# Patient Record
Sex: Female | Born: 1935 | Race: Black or African American | Hispanic: No | Smoking: Former smoker
Health system: Southern US, Community
[De-identification: ages and names within clinical notes are randomized; demographics above are authoritative.]

## PROBLEM LIST (undated history)

## (undated) DIAGNOSIS — E538 Deficiency of other specified B group vitamins: Secondary | ICD-10-CM

## (undated) DIAGNOSIS — E119 Type 2 diabetes mellitus without complications: Secondary | ICD-10-CM

## (undated) DIAGNOSIS — B351 Tinea unguium: Secondary | ICD-10-CM

## (undated) DIAGNOSIS — M810 Age-related osteoporosis without current pathological fracture: Secondary | ICD-10-CM

## (undated) DIAGNOSIS — E039 Hypothyroidism, unspecified: Secondary | ICD-10-CM

## (undated) DIAGNOSIS — F0390 Unspecified dementia without behavioral disturbance: Secondary | ICD-10-CM

## (undated) DIAGNOSIS — K279 Peptic ulcer, site unspecified, unspecified as acute or chronic, without hemorrhage or perforation: Secondary | ICD-10-CM

## (undated) DIAGNOSIS — K259 Gastric ulcer, unspecified as acute or chronic, without hemorrhage or perforation: Secondary | ICD-10-CM

## (undated) DIAGNOSIS — D649 Anemia, unspecified: Secondary | ICD-10-CM

## (undated) HISTORY — DX: Tinea unguium: B35.1

## (undated) HISTORY — PX: EXCISION VAGINAL CYST: SHX5825

## (undated) HISTORY — PX: COLONOSCOPY: SHX174

## (undated) HISTORY — DX: Peptic ulcer, site unspecified, unspecified as acute or chronic, without hemorrhage or perforation: K27.9

## (undated) HISTORY — DX: Deficiency of other specified B group vitamins: E53.8

## (undated) HISTORY — PX: ESOPHAGOGASTRODUODENOSCOPY: SHX1529

## (undated) HISTORY — PX: OTHER SURGICAL HISTORY: SHX169

## (undated) HISTORY — DX: Type 2 diabetes mellitus without complications: E11.9

---

## 1998-06-27 ENCOUNTER — Other Ambulatory Visit: Admission: RE | Admit: 1998-06-27 | Discharge: 1998-06-27 | Payer: Self-pay | Admitting: Obstetrics and Gynecology

## 1998-09-19 ENCOUNTER — Ambulatory Visit (HOSPITAL_COMMUNITY): Admission: RE | Admit: 1998-09-19 | Discharge: 1998-09-19 | Payer: Self-pay

## 1999-09-22 ENCOUNTER — Ambulatory Visit (HOSPITAL_COMMUNITY): Admission: RE | Admit: 1999-09-22 | Discharge: 1999-09-22 | Payer: Self-pay | Admitting: *Deleted

## 2000-10-09 ENCOUNTER — Encounter: Payer: Self-pay | Admitting: Internal Medicine

## 2000-10-09 ENCOUNTER — Ambulatory Visit (HOSPITAL_COMMUNITY): Admission: RE | Admit: 2000-10-09 | Discharge: 2000-10-09 | Payer: Self-pay | Admitting: *Deleted

## 2001-10-10 ENCOUNTER — Ambulatory Visit (HOSPITAL_COMMUNITY): Admission: RE | Admit: 2001-10-10 | Discharge: 2001-10-10 | Payer: Self-pay | Admitting: Internal Medicine

## 2001-10-10 ENCOUNTER — Encounter: Payer: Self-pay | Admitting: Internal Medicine

## 2002-10-13 ENCOUNTER — Ambulatory Visit (HOSPITAL_COMMUNITY): Admission: RE | Admit: 2002-10-13 | Discharge: 2002-10-13 | Payer: Self-pay | Admitting: Internal Medicine

## 2002-10-13 ENCOUNTER — Encounter: Payer: Self-pay | Admitting: Internal Medicine

## 2003-10-19 ENCOUNTER — Ambulatory Visit (HOSPITAL_COMMUNITY): Admission: RE | Admit: 2003-10-19 | Discharge: 2003-10-19 | Payer: Self-pay | Admitting: Internal Medicine

## 2004-09-12 ENCOUNTER — Ambulatory Visit: Payer: Self-pay | Admitting: Gastroenterology

## 2004-10-20 ENCOUNTER — Ambulatory Visit (HOSPITAL_COMMUNITY): Admission: RE | Admit: 2004-10-20 | Discharge: 2004-10-20 | Payer: Self-pay | Admitting: Internal Medicine

## 2004-12-26 ENCOUNTER — Ambulatory Visit: Payer: Self-pay | Admitting: Gastroenterology

## 2005-10-23 ENCOUNTER — Ambulatory Visit (HOSPITAL_COMMUNITY): Admission: RE | Admit: 2005-10-23 | Discharge: 2005-10-23 | Payer: Self-pay | Admitting: Internal Medicine

## 2006-10-14 ENCOUNTER — Ambulatory Visit: Payer: Self-pay | Admitting: Internal Medicine

## 2006-10-29 ENCOUNTER — Ambulatory Visit (HOSPITAL_COMMUNITY): Admission: RE | Admit: 2006-10-29 | Discharge: 2006-10-29 | Payer: Self-pay | Admitting: Internal Medicine

## 2006-11-05 ENCOUNTER — Ambulatory Visit: Payer: Self-pay | Admitting: Internal Medicine

## 2006-11-07 ENCOUNTER — Encounter: Admission: RE | Admit: 2006-11-07 | Discharge: 2006-11-07 | Payer: Self-pay | Admitting: Internal Medicine

## 2006-12-05 ENCOUNTER — Ambulatory Visit: Payer: Self-pay | Admitting: Internal Medicine

## 2006-12-21 ENCOUNTER — Emergency Department: Payer: Self-pay | Admitting: Emergency Medicine

## 2007-11-18 ENCOUNTER — Ambulatory Visit (HOSPITAL_COMMUNITY): Admission: RE | Admit: 2007-11-18 | Discharge: 2007-11-18 | Payer: Self-pay | Admitting: Internal Medicine

## 2007-11-28 ENCOUNTER — Encounter: Admission: RE | Admit: 2007-11-28 | Discharge: 2007-11-28 | Payer: Self-pay | Admitting: Internal Medicine

## 2008-04-30 ENCOUNTER — Other Ambulatory Visit: Payer: Self-pay

## 2008-04-30 ENCOUNTER — Emergency Department: Payer: Self-pay | Admitting: Emergency Medicine

## 2008-10-27 ENCOUNTER — Ambulatory Visit: Payer: Self-pay

## 2008-11-03 ENCOUNTER — Ambulatory Visit: Payer: Self-pay | Admitting: Vascular Surgery

## 2008-11-24 ENCOUNTER — Ambulatory Visit: Payer: Self-pay | Admitting: Vascular Surgery

## 2008-11-30 ENCOUNTER — Ambulatory Visit (HOSPITAL_COMMUNITY): Admission: RE | Admit: 2008-11-30 | Discharge: 2008-11-30 | Payer: Self-pay | Admitting: Internal Medicine

## 2008-12-01 ENCOUNTER — Inpatient Hospital Stay: Payer: Self-pay | Admitting: Vascular Surgery

## 2010-01-25 ENCOUNTER — Ambulatory Visit (HOSPITAL_COMMUNITY): Admission: RE | Admit: 2010-01-25 | Discharge: 2010-01-25 | Payer: Self-pay | Admitting: Internal Medicine

## 2010-01-27 ENCOUNTER — Encounter: Admission: RE | Admit: 2010-01-27 | Discharge: 2010-01-27 | Payer: Self-pay | Admitting: Internal Medicine

## 2010-08-27 ENCOUNTER — Encounter: Payer: Self-pay | Admitting: Internal Medicine

## 2011-01-19 ENCOUNTER — Other Ambulatory Visit (HOSPITAL_COMMUNITY): Payer: Self-pay | Admitting: Internal Medicine

## 2011-01-19 DIAGNOSIS — Z1231 Encounter for screening mammogram for malignant neoplasm of breast: Secondary | ICD-10-CM

## 2011-01-30 ENCOUNTER — Ambulatory Visit (HOSPITAL_COMMUNITY)
Admission: RE | Admit: 2011-01-30 | Discharge: 2011-01-30 | Disposition: A | Discharge status: Home / Self Care | Payer: Medicare Other | Source: Ambulatory Visit | Attending: Internal Medicine | Admitting: Internal Medicine

## 2011-01-30 DIAGNOSIS — Z1231 Encounter for screening mammogram for malignant neoplasm of breast: Secondary | ICD-10-CM | POA: Insufficient documentation

## 2011-02-22 ENCOUNTER — Ambulatory Visit: Payer: Self-pay | Admitting: Ophthalmology

## 2011-03-06 ENCOUNTER — Ambulatory Visit: Payer: Self-pay | Admitting: Ophthalmology

## 2011-04-24 ENCOUNTER — Ambulatory Visit: Payer: Self-pay | Admitting: Ophthalmology

## 2011-05-01 ENCOUNTER — Ambulatory Visit: Payer: Self-pay | Admitting: Ophthalmology

## 2011-12-27 ENCOUNTER — Other Ambulatory Visit (HOSPITAL_COMMUNITY): Payer: Self-pay | Admitting: Internal Medicine

## 2011-12-27 DIAGNOSIS — Z1231 Encounter for screening mammogram for malignant neoplasm of breast: Secondary | ICD-10-CM

## 2012-02-01 ENCOUNTER — Ambulatory Visit (HOSPITAL_COMMUNITY)
Admission: RE | Admit: 2012-02-01 | Discharge: 2012-02-01 | Disposition: A | Discharge status: Home / Self Care | Payer: Medicare Other | Source: Ambulatory Visit | Attending: Internal Medicine | Admitting: Internal Medicine

## 2012-02-01 DIAGNOSIS — Z1231 Encounter for screening mammogram for malignant neoplasm of breast: Secondary | ICD-10-CM

## 2012-06-27 ENCOUNTER — Ambulatory Visit: Payer: Self-pay | Admitting: Gastroenterology

## 2012-06-30 LAB — PATHOLOGY REPORT

## 2013-01-14 ENCOUNTER — Other Ambulatory Visit (HOSPITAL_COMMUNITY): Payer: Self-pay | Admitting: Internal Medicine

## 2013-01-14 DIAGNOSIS — Z1231 Encounter for screening mammogram for malignant neoplasm of breast: Secondary | ICD-10-CM

## 2013-02-02 ENCOUNTER — Ambulatory Visit (HOSPITAL_COMMUNITY)
Admission: RE | Admit: 2013-02-02 | Discharge: 2013-02-02 | Disposition: A | Discharge status: Home / Self Care | Payer: Medicare Other | Source: Ambulatory Visit | Attending: Internal Medicine | Admitting: Internal Medicine

## 2013-02-02 DIAGNOSIS — Z1231 Encounter for screening mammogram for malignant neoplasm of breast: Secondary | ICD-10-CM | POA: Insufficient documentation

## 2013-02-04 DIAGNOSIS — B351 Tinea unguium: Secondary | ICD-10-CM

## 2013-02-04 HISTORY — DX: Tinea unguium: B35.1

## 2013-04-22 ENCOUNTER — Encounter: Payer: Self-pay | Admitting: *Deleted

## 2013-04-22 DIAGNOSIS — B351 Tinea unguium: Secondary | ICD-10-CM | POA: Insufficient documentation

## 2013-05-06 ENCOUNTER — Encounter: Payer: Self-pay | Admitting: Podiatry

## 2013-05-06 ENCOUNTER — Ambulatory Visit (INDEPENDENT_AMBULATORY_CARE_PROVIDER_SITE_OTHER): Payer: Medicare Other | Admitting: Podiatry

## 2013-05-06 VITALS — BP 181/79 | HR 57 | Temp 97.8°F | Ht 66.0 in | Wt 152.4 lb

## 2013-05-06 DIAGNOSIS — B351 Tinea unguium: Secondary | ICD-10-CM

## 2013-05-06 DIAGNOSIS — M79609 Pain in unspecified limb: Secondary | ICD-10-CM

## 2013-05-06 NOTE — Progress Notes (Signed)
Janet Gross presents with a cc of painful nails bilateral.  There is no change in her past medical history medications or allergies.  Vitals are stable and she is alert and oriented times 3. Pulses are palpable. Her nails are thick yellow dystrophic and clinically mycotic and they are painful on palpation bilaterally.  Neurovascular is unchanged. Pain in limb secondary to onychomycosis 1-5 bilateral.  Discussed the etiology, pathology and conservative versus surgical therapy with her.  At this point in time I debrided the nails in thickness and length as covered service to pain.  No iatrogenic lesions are noted.  She will f/u with [place] in three months.

## 2013-05-26 ENCOUNTER — Ambulatory Visit: Payer: Self-pay | Admitting: Gastroenterology

## 2013-08-10 ENCOUNTER — Ambulatory Visit (INDEPENDENT_AMBULATORY_CARE_PROVIDER_SITE_OTHER): Payer: Medicare Other | Admitting: Podiatry

## 2013-08-10 ENCOUNTER — Encounter: Payer: Self-pay | Admitting: Podiatry

## 2013-08-10 VITALS — BP 145/76 | HR 55 | Resp 16 | Ht 66.0 in | Wt 152.0 lb

## 2013-08-10 DIAGNOSIS — B351 Tinea unguium: Secondary | ICD-10-CM

## 2013-08-10 DIAGNOSIS — M79609 Pain in unspecified limb: Secondary | ICD-10-CM

## 2013-08-10 NOTE — Progress Notes (Signed)
She presents today with a chief complaint of a painful corn to the medial aspect of the fifth digit of the right foot. She's also complaining of painful elongated toenails. She denies fever chills nausea vomiting muscle aches or pains.  Objective: Pulses are strongly palpable bilateral. Reactive hyperkeratosis to the medial and lateral aspect of the PIPJ digit right foot. Mild hammertoe deformities are noted bilateral. Nails are thick yellow dystrophic clinically mycotic and painful palpation as well as debridement.  Assessment: Pain in limb secondary to onychomycosis 1 through 5 bilateral.  Plan: Debridement of nails and reactive hyperkeratosis bilateral foot followup with her in 3 months.

## 2013-08-19 ENCOUNTER — Emergency Department: Payer: Self-pay

## 2013-08-19 LAB — CBC
HCT: 42 % (ref 35.0–47.0)
HGB: 13.4 g/dL (ref 12.0–16.0)
MCH: 25.2 pg — AB (ref 26.0–34.0)
MCHC: 31.9 g/dL — AB (ref 32.0–36.0)
MCV: 79 fL — AB (ref 80–100)
Platelet: 241 10*3/uL (ref 150–440)
RBC: 5.33 10*6/uL — AB (ref 3.80–5.20)
RDW: 17 % — ABNORMAL HIGH (ref 11.5–14.5)
WBC: 5.3 10*3/uL (ref 3.6–11.0)

## 2013-08-19 LAB — URINALYSIS, COMPLETE
BILIRUBIN, UR: NEGATIVE
Blood: NEGATIVE
Glucose,UR: 150 mg/dL (ref 0–75)
KETONE: NEGATIVE
Nitrite: NEGATIVE
PH: 6 (ref 4.5–8.0)
PROTEIN: NEGATIVE
RBC,UR: 2 /HPF (ref 0–5)
SPECIFIC GRAVITY: 1.02 (ref 1.003–1.030)
WBC UR: 6 /HPF (ref 0–5)

## 2013-08-19 LAB — BASIC METABOLIC PANEL
Anion Gap: 4 — ABNORMAL LOW (ref 7–16)
BUN: 14 mg/dL (ref 7–18)
CALCIUM: 9.3 mg/dL (ref 8.5–10.1)
CHLORIDE: 106 mmol/L (ref 98–107)
CO2: 29 mmol/L (ref 21–32)
Creatinine: 0.81 mg/dL (ref 0.60–1.30)
EGFR (African American): >60
GLUCOSE: 52 mg/dL — AB (ref 65–99)
OSMOLALITY: 275 (ref 275–301)
POTASSIUM: 3.8 mmol/L (ref 3.5–5.1)
SODIUM: 139 mmol/L (ref 136–145)

## 2013-11-09 ENCOUNTER — Ambulatory Visit: Payer: Medicare Other | Admitting: Podiatry

## 2013-11-16 ENCOUNTER — Ambulatory Visit (INDEPENDENT_AMBULATORY_CARE_PROVIDER_SITE_OTHER): Payer: Medicare Other | Admitting: Podiatry

## 2013-11-16 VITALS — Resp 16 | Ht 66.0 in | Wt 145.0 lb

## 2013-11-16 DIAGNOSIS — B351 Tinea unguium: Secondary | ICD-10-CM

## 2013-11-16 DIAGNOSIS — M79609 Pain in unspecified limb: Secondary | ICD-10-CM

## 2013-11-16 NOTE — Progress Notes (Signed)
She presents today for routine nail debridement and callus debridement particularly painful corn between the fourth and fifth digits right foot which I have suggested surgery. She does have surgery performed so she will wear suitable padding between the toes.  Objective: Vital signs are stable she is alert and oriented x3. Nails are thick yellow dystrophic with mycotic and painful palpation. She also has a painful corn between fourth fifth digits of the right foot.  Assessment: Pain in limb secondary to nails 1 through 5 bilateral being mycotic.Marland Kitchen  Plan: Debridement of nails and corns and calluses bilateral.

## 2014-02-15 ENCOUNTER — Encounter: Payer: Self-pay | Admitting: Podiatry

## 2014-02-15 ENCOUNTER — Ambulatory Visit (INDEPENDENT_AMBULATORY_CARE_PROVIDER_SITE_OTHER): Payer: Medicare Other | Admitting: Podiatry

## 2014-02-15 DIAGNOSIS — Q828 Other specified congenital malformations of skin: Secondary | ICD-10-CM

## 2014-02-15 DIAGNOSIS — E119 Type 2 diabetes mellitus without complications: Secondary | ICD-10-CM

## 2014-02-15 DIAGNOSIS — M79676 Pain in unspecified toe(s): Secondary | ICD-10-CM

## 2014-02-15 DIAGNOSIS — M79609 Pain in unspecified limb: Secondary | ICD-10-CM

## 2014-02-15 DIAGNOSIS — B351 Tinea unguium: Secondary | ICD-10-CM

## 2014-02-15 NOTE — Progress Notes (Signed)
She presents today with a chief complaint of painful elongated toenails and a callus between her fourth and fifth digits of her right foot.  Objective: Vital signs are stable she is alert and oriented x3. Porokeratotic lesion, soft corn fourth interdigital space of the right foot. No sign of skin breakdown or infection. Nails are thick yellow dystrophic with mycotic and severely elongated.  Assessment: Pain in limb secondary to onychomycosis 1 through 5 bilateral. Non-insulin-dependent diabetes mellitus. Porokeratosis and fourth interdigital space right foot.  Plan: Debridement all reactive hyperkeratotic tissue debris all nails 1 through 5 bilateral covered service.

## 2014-02-22 DIAGNOSIS — E039 Hypothyroidism, unspecified: Secondary | ICD-10-CM | POA: Insufficient documentation

## 2014-02-22 DIAGNOSIS — E119 Type 2 diabetes mellitus without complications: Secondary | ICD-10-CM | POA: Insufficient documentation

## 2014-02-22 DIAGNOSIS — E669 Obesity, unspecified: Secondary | ICD-10-CM | POA: Insufficient documentation

## 2014-02-22 DIAGNOSIS — M810 Age-related osteoporosis without current pathological fracture: Secondary | ICD-10-CM | POA: Insufficient documentation

## 2014-02-22 DIAGNOSIS — K259 Gastric ulcer, unspecified as acute or chronic, without hemorrhage or perforation: Secondary | ICD-10-CM | POA: Insufficient documentation

## 2014-02-22 DIAGNOSIS — E785 Hyperlipidemia, unspecified: Secondary | ICD-10-CM | POA: Insufficient documentation

## 2014-05-24 ENCOUNTER — Ambulatory Visit (INDEPENDENT_AMBULATORY_CARE_PROVIDER_SITE_OTHER): Payer: Medicare Other | Admitting: Podiatry

## 2014-05-24 DIAGNOSIS — B351 Tinea unguium: Secondary | ICD-10-CM

## 2014-05-24 DIAGNOSIS — M79676 Pain in unspecified toe(s): Secondary | ICD-10-CM

## 2014-05-24 NOTE — Progress Notes (Signed)
Presents today chief complaint of painful elongated toenails.  Objective: Pulses are palpable bilateral nails are thick, yellow dystrophic onychomycosis and painful palpation.   Assessment: Onychomycosis with pain in limb.  Plan: Treatment of nails in thickness and length as covered service secondary to pain.  

## 2014-08-19 ENCOUNTER — Ambulatory Visit: Payer: Self-pay | Admitting: Internal Medicine

## 2014-08-23 ENCOUNTER — Ambulatory Visit: Payer: Medicare Other | Admitting: Podiatry

## 2014-08-23 ENCOUNTER — Ambulatory Visit: Payer: Medicare Other

## 2014-08-30 ENCOUNTER — Ambulatory Visit: Payer: Self-pay

## 2014-09-06 ENCOUNTER — Ambulatory Visit (INDEPENDENT_AMBULATORY_CARE_PROVIDER_SITE_OTHER): Payer: Medicare Other | Admitting: Podiatry

## 2014-09-06 DIAGNOSIS — B351 Tinea unguium: Secondary | ICD-10-CM

## 2014-09-06 DIAGNOSIS — M79676 Pain in unspecified toe(s): Secondary | ICD-10-CM

## 2014-09-06 NOTE — Progress Notes (Signed)
Presents today chief complaint of painful elongated toenails.  Objective: Pulses are palpable bilateral nails are thick, yellow dystrophic onychomycosis and painful palpation.   Assessment: Onychomycosis with pain in limb.  Plan: Treatment of nails in thickness and length as covered service secondary to pain.  

## 2014-12-06 ENCOUNTER — Ambulatory Visit (INDEPENDENT_AMBULATORY_CARE_PROVIDER_SITE_OTHER): Payer: Medicare Other | Admitting: Podiatry

## 2014-12-06 DIAGNOSIS — M79676 Pain in unspecified toe(s): Secondary | ICD-10-CM | POA: Diagnosis not present

## 2014-12-06 DIAGNOSIS — B351 Tinea unguium: Secondary | ICD-10-CM | POA: Diagnosis not present

## 2014-12-06 NOTE — Progress Notes (Signed)
Presents today chief complaint of painful elongated toenails.  Objective: Pulses are palpable bilateral nails are thick, yellow dystrophic onychomycosis and painful palpation.   Assessment: Onychomycosis 1-5 bilateral with pain in limb.  Plan: Treatment of nails 1-5 bilateral in thickness and length as covered service secondary to pain.

## 2015-03-08 ENCOUNTER — Ambulatory Visit: Payer: Medicare Other | Admitting: Podiatry

## 2015-03-16 ENCOUNTER — Ambulatory Visit: Payer: Medicare Other | Admitting: Podiatry

## 2015-03-17 ENCOUNTER — Ambulatory Visit: Payer: Medicare Other | Admitting: Podiatry

## 2015-03-23 ENCOUNTER — Ambulatory Visit (INDEPENDENT_AMBULATORY_CARE_PROVIDER_SITE_OTHER): Payer: Medicare Other | Admitting: Podiatry

## 2015-03-23 ENCOUNTER — Encounter: Payer: Self-pay | Admitting: Podiatry

## 2015-03-23 DIAGNOSIS — B351 Tinea unguium: Secondary | ICD-10-CM

## 2015-03-23 DIAGNOSIS — M79676 Pain in unspecified toe(s): Secondary | ICD-10-CM | POA: Diagnosis not present

## 2015-03-23 NOTE — Progress Notes (Signed)
Janet Gross presents today with a chief complaint of painful elongated toenails. She states that even bothering for quite some t time particularly with shoe gear.  Objective: Vital signs are stable she's alert and oriented 81 year old white female strong palpable pulses bilateral. Her toes are painful on palpation to the nails and the nail plates which are incurvated nail margins the yellow dystrophic onychomycotic nails which are painful on palpation as well as debridement.  Assessment: Pain in limb secondary to onychomycosis 1 through 5 bilateral.  Plan: Debridement of her nails 1 through 5 and 6 through 10 bilateral foot. Follow up with her in 3 months if needed.  Dr. Roselind Messier

## 2015-06-22 ENCOUNTER — Ambulatory Visit (INDEPENDENT_AMBULATORY_CARE_PROVIDER_SITE_OTHER): Payer: Medicare Other | Admitting: Podiatry

## 2015-06-22 ENCOUNTER — Encounter: Payer: Self-pay | Admitting: Podiatry

## 2015-06-22 DIAGNOSIS — B351 Tinea unguium: Secondary | ICD-10-CM

## 2015-06-22 DIAGNOSIS — M79676 Pain in unspecified toe(s): Secondary | ICD-10-CM | POA: Diagnosis not present

## 2015-06-22 NOTE — Progress Notes (Signed)
She presents today with a chief complaint of painful elongated toenails.  Objective: Vital signs are stable she is alert and oriented 3. Pulses are strongly palpable bilateral. Neurologic sensorium is intact. Toenails are thick yellow dystrophic onychomycotic and painful palpation.  Assessment: Pain in limb secondary to onychomycosis 1 through 5 bilateral feet.  Plan: Debridement of toenails 1 through 5 bilateral. Follow up with her in 3 months.

## 2015-07-13 ENCOUNTER — Encounter: Payer: Self-pay | Admitting: Urgent Care

## 2015-07-13 ENCOUNTER — Observation Stay
Admission: EM | Admit: 2015-07-13 | Discharge: 2015-07-14 | Disposition: A | Discharge status: Home / Self Care | Payer: Medicare Other | Attending: Internal Medicine | Admitting: Internal Medicine

## 2015-07-13 DIAGNOSIS — Z79899 Other long term (current) drug therapy: Secondary | ICD-10-CM | POA: Diagnosis not present

## 2015-07-13 DIAGNOSIS — Z87891 Personal history of nicotine dependence: Secondary | ICD-10-CM | POA: Insufficient documentation

## 2015-07-13 DIAGNOSIS — R4182 Altered mental status, unspecified: Secondary | ICD-10-CM | POA: Diagnosis not present

## 2015-07-13 DIAGNOSIS — E11649 Type 2 diabetes mellitus with hypoglycemia without coma: Principal | ICD-10-CM | POA: Insufficient documentation

## 2015-07-13 DIAGNOSIS — E785 Hyperlipidemia, unspecified: Secondary | ICD-10-CM | POA: Diagnosis not present

## 2015-07-13 DIAGNOSIS — Z7982 Long term (current) use of aspirin: Secondary | ICD-10-CM | POA: Diagnosis not present

## 2015-07-13 DIAGNOSIS — Z882 Allergy status to sulfonamides status: Secondary | ICD-10-CM | POA: Diagnosis not present

## 2015-07-13 DIAGNOSIS — E162 Hypoglycemia, unspecified: Secondary | ICD-10-CM | POA: Diagnosis present

## 2015-07-13 DIAGNOSIS — Z794 Long term (current) use of insulin: Secondary | ICD-10-CM | POA: Diagnosis not present

## 2015-07-13 DIAGNOSIS — B351 Tinea unguium: Secondary | ICD-10-CM | POA: Insufficient documentation

## 2015-07-13 DIAGNOSIS — E039 Hypothyroidism, unspecified: Secondary | ICD-10-CM | POA: Insufficient documentation

## 2015-07-13 DIAGNOSIS — T383X1A Poisoning by insulin and oral hypoglycemic [antidiabetic] drugs, accidental (unintentional), initial encounter: Secondary | ICD-10-CM | POA: Diagnosis present

## 2015-07-13 LAB — GLUCOSE, CAPILLARY
GLUCOSE-CAPILLARY: 64 mg/dL — AB (ref 65–99)
GLUCOSE-CAPILLARY: 72 mg/dL (ref 65–99)
Glucose-Capillary: 27 mg/dL — CL (ref 65–99)
Glucose-Capillary: 72 mg/dL (ref 65–99)

## 2015-07-13 LAB — BASIC METABOLIC PANEL
Anion gap: 6 (ref 5–15)
BUN: 11 mg/dL (ref 6–20)
CALCIUM: 9.2 mg/dL (ref 8.9–10.3)
CHLORIDE: 105 mmol/L (ref 101–111)
CO2: 28 mmol/L (ref 22–32)
CREATININE: 0.69 mg/dL (ref 0.44–1.00)
GFR calc non Af Amer: >60 mL/min (ref >60)
Glucose, Bld: 112 mg/dL — ABNORMAL HIGH (ref 65–99)
Potassium: 3.9 mmol/L (ref 3.5–5.1)
SODIUM: 139 mmol/L (ref 135–145)

## 2015-07-13 LAB — URINALYSIS COMPLETE WITH MICROSCOPIC (ARMC ONLY)
BACTERIA UA: NONE SEEN
BILIRUBIN URINE: NEGATIVE
Glucose, UA: 50 mg/dL — AB
Hgb urine dipstick: NEGATIVE
Ketones, ur: NEGATIVE mg/dL
Leukocytes, UA: NEGATIVE
NITRITE: NEGATIVE
PH: 7 (ref 5.0–8.0)
PROTEIN: NEGATIVE mg/dL
Specific Gravity, Urine: 1.013 (ref 1.005–1.030)
WBC UA: NONE SEEN WBC/hpf (ref 0–5)

## 2015-07-13 LAB — CBC
HCT: 40.4 % (ref 35.0–47.0)
HEMOGLOBIN: 12.7 g/dL (ref 12.0–16.0)
MCH: 24.9 pg — AB (ref 26.0–34.0)
MCHC: 31.4 g/dL — AB (ref 32.0–36.0)
MCV: 79.3 fL — ABNORMAL LOW (ref 80.0–100.0)
Platelets: 259 10*3/uL (ref 150–440)
RBC: 5.09 MIL/uL (ref 3.80–5.20)
RDW: 16.2 % — AB (ref 11.5–14.5)
WBC: 6 10*3/uL (ref 3.6–11.0)

## 2015-07-13 MED ORDER — SODIUM CHLORIDE 0.9 % IV BOLUS (SEPSIS)
500.0000 mL | Freq: Once | INTRAVENOUS | Status: AC
  Administered 2015-07-13: 500 mL via INTRAVENOUS

## 2015-07-13 MED ORDER — DEXTROSE 50 % IV SOLN
1.0000 | Freq: Once | INTRAVENOUS | Status: AC
  Administered 2015-07-13: 50 mL via INTRAVENOUS

## 2015-07-13 MED ORDER — DEXTROSE 50 % IV SOLN: 1.0000 | Freq: Once | INTRAVENOUS | Status: DC

## 2015-07-13 NOTE — ED Provider Notes (Signed)
[facility] Emergency Department Provider Note REMINDER - THIS NOTE IS NOT A FINAL MEDICAL RECORD UNTIL IT IS SIGNED. UNTIL THEN, THE CONTENT BELOW MAY REFLECT INFORMATION FROM A DOCUMENTATION TEMPLATE, NOT THE ACTUAL PATIENT VISIT. ____________________________________________  Time seen: Approximately 9:43 PM  I have reviewed the triage vital signs and the nursing notes.   HISTORY  Chief Complaint Hypoglycemia    HPI Janet Gross is a 81 y.o. female presents with family for confusion. Janet Gross reports that the patient reported that her vision seemed off earlier today, and this evening she began to behave erratically try to put socks on top of her head and "talking out of her head" for about the last hour and a half. She was very combative with them, they brought her to the ER for further evaluation.  Blood sugar at triage and read 24. After receiving an amp of D50, the patient is alert oriented able to give good history.   She reports that today she ate an omelette for breakfast, she took her normal medications including Glucotrol at approximately 9 AM. She then did not eat well, thinks that she may only been eating oranges throughout the day and denies taking extra medication. She then started to develop some feeling strange, slight blurring of her vision, and then doesn't really recall much of events from this afternoon. She denies any numbness, tingling, weakness, slurred speech, headache, chest pain or other concerns. No recent illness or fever.   Past Medical History  Diagnosis Date  . Diabetes mellitus without complication ([place])   . Onychomycosis 02/04/2013    Patient Active Problem List   Diagnosis Date Noted  . Hypoglycemia 07/13/2015  . Onychomycosis     Past Surgical History  Procedure Laterality Date  . Neck artery    . Colonoscopy      Current Outpatient Rx  Name  Route  Sig  Dispense  Refill  . aspirin EC 81 MG tablet   Oral  Take 81 mg by mouth daily.          . Fe Fum-FePoly-Vit C-Vit B3 (INTEGRA) 62.5-62.5-40-3 MG CAPS   Oral   Take 1 capsule by mouth daily.          Marland Kitchen gabapentin (NEURONTIN) 300 MG capsule   Oral   Take 300 mg by mouth at bedtime.          Marland Kitchen glipiZIDE (GLUCOTROL) 5 MG tablet   Oral   Take 5 mg by mouth 2 (two) times daily before a meal.          . levothyroxine (SYNTHROID, LEVOTHROID) 100 MCG tablet   Oral   Take 100 mcg by mouth daily before breakfast.          . magnesium oxide (MAG-OX) 400 MG tablet   Oral   Take 400 mg by mouth daily.          . metFORMIN (GLUCOPHAGE) 1000 MG tablet   Oral   Take 1,000 mg by mouth 2 (two) times daily with a meal.          . simvastatin (ZOCOR) 40 MG tablet   Oral   Take 40 mg by mouth at bedtime.            Allergies Sulfa antibiotics  No family history on file.  Social History Social History  Substance Use Topics  . Smoking status: Former Research scientist (life sciences)  . Smokeless tobacco: Never Used     Comment: quit over 40 years  ago  . Alcohol Use: No    Review of Systems Constitutional: No fever/chills Eyes: Visual changes all resolved now. Had blurring of the vision earlier. ENT: No sore throat. Cardiovascular: Denies chest pain. Respiratory: Denies shortness of breath. Gastrointestinal: No abdominal pain.  No nausea, no vomiting.  No diarrhea.  No constipation. Genitourinary: Negative for dysuria. Musculoskeletal: Negative for back pain. Skin: Negative for rash. Neurological: Negative for headaches, focal weakness or numbness.  10-point ROS otherwise negative.  ____________________________________________   PHYSICAL EXAM:  VITAL SIGNS: ED Triage Vitals  Enc Vitals Group     BP 07/13/15 2136 168/149 mmHg     Pulse Rate 07/13/15 2136 70     Resp 07/13/15 2136 10     Temp 07/13/15 2136 97.4 F (36.3 C)     Temp Source 07/13/15 2136 Oral     SpO2 07/13/15 2136 98 %     Weight 07/13/15 2136 150 lb (68.04 kg)      Height 07/13/15 2136 5\' 8"  (1.727 m)     Head Cir --      Peak Flow --      Pain Score 07/13/15 2137 0     Pain Loc --      Pain Edu? --      Excl. in [place]? --    Constitutional: Alert and oriented. Well appearing and in no acute distress. Eyes: Conjunctivae are normal. PERRL. EOMI. Head: Atraumatic. Nose: No congestion/rhinnorhea. Mouth/Throat: Mucous membranes are slightly dry.  Oropharynx non-erythematous. Neck: No stridor.   Cardiovascular: Normal rate, regular rhythm. Grossly normal heart sounds.  Good peripheral circulation. Respiratory: Normal respiratory effort.  No retractions. Lungs CTAB. Gastrointestinal: Soft and nontender. No distention. No abdominal bruits. No CVA tenderness. Musculoskeletal: No lower extremity tenderness nor edema.  No joint effusions. Neurologic:    NIH score equals 0, performed by me at bedside. The patient has no pronator drift. The patient has normal cranial nerve exam. Extraocular movements are normal. Visual fields are normal. Patient has 5 out of 5 strength in all extremities. There is no numbness or gross, acute sensory abnormality in the extremities bilaterally. No speech disturbance. No dysarthria. No aphasia. No ataxia. Normal finger nose finger bilat. Patient speaking in full and clear sentences.   Skin:  Skin is warm, dry and intact. No rash noted. Psychiatric: Mood and affect are normal. Speech and behavior are normal.  ____________________________________________   LABS (all labs ordered are listed, but only abnormal results are displayed)  Labs Reviewed  GLUCOSE, CAPILLARY - Abnormal; Notable for the following:    Glucose-Capillary 27 (*)    All other components within normal limits  CBC - Abnormal; Notable for the following:    MCV 79.3 (*)    MCH 24.9 (*)    MCHC 31.4 (*)    RDW 16.2 (*)    All other components within normal limits  BASIC METABOLIC PANEL - Abnormal; Notable for the following:    Glucose, Bld 112  (*)    All other components within normal limits  URINALYSIS COMPLETEWITH MICROSCOPIC ([facility] ONLY) - Abnormal; Notable for the following:    Color, Urine YELLOW (*)    APPearance CLOUDY (*)    Glucose, UA 50 (*)    Squamous Epithelial / LPF 0-5 (*)    All other components within normal limits  GLUCOSE, CAPILLARY - Abnormal; Notable for the following:    Glucose-Capillary 64 (*)    All other components within normal limits  GLUCOSE, CAPILLARY  GLUCOSE,  CAPILLARY  CBG MONITORING, ED  CBG MONITORING, ED  CBG MONITORING, ED  CBG MONITORING, ED  CBG MONITORING, ED  CBG MONITORING, ED  CBG MONITORING, ED  CBG MONITORING, ED  CBG MONITORING, ED  CBG MONITORING, ED  CBG MONITORING, ED  CBG MONITORING, ED  CBG MONITORING, ED   ____________________________________________  EKG   ____________________________________________  RADIOLOGY   ____________________________________________   PROCEDURES  Procedure(s) performed: None  Critical Care performed: Yes, see critical care note(s)  CRITICAL CARE Performed by: Delman Kitten   Total critical care time: 35 minutes  Critical care time was exclusive of separately billable procedures and treating other patients.  Critical care was necessary to treat or prevent imminent or life-threatening deterioration.  Critical care was time spent personally by me on the following activities: development of treatment plan with patient and/or surrogate as well as nursing, discussions with consultants, evaluation of patient's response to treatment, examination of patient, obtaining history from patient or surrogate, ordering and performing treatments and interventions, ordering and review of laboratory studies, ordering and review of radiographic studies, pulse oximetry and re-evaluation of patient's condition.  Patient presented with acute alteration in mental status, found to be severely hypoglycemic requiring IV treatment including dextrose  50% for life-threatening hypoglycemia. ____________________________________________   INITIAL IMPRESSION / ASSESSMENT AND PLAN / ED COURSE  Pertinent labs & imaging results that were available during my care of the patient were reviewed by me and considered in my medical decision making (see chart for details).  Patient presents with hypoglycemia. Seemingly no obvious recent infectious symptoms, or signs to suggest acute renal failure by clinical exam or history. No obvious insult, but the patient did present with altered mental status that completely resolved after receiving D50. She is noted be on a sulfonylurea as well as metformin. Given herself on area treatment with severe hypoglycemia, we'll admit the patient to hospital for further management and observation. We will continue to check blood glucose is regular in the ER. Her neuro exam is completely normal with an NIH score of 0. No evidence of acute neurologic abnormality or need for CT the head. No check basic labs to observe her closely.  D/W Margarita Grizzle at poison control. 10PM. Advises send labs, observe with Q 1 hour glucose,  ____________________________________________   FINAL CLINICAL IMPRESSION(S) / ED DIAGNOSES  Final diagnoses:  Sulfonylurea poisoning, accidental or unintentional, initial encounter  Hypoglycemia      Delman Kitten, MD 07/13/15 2356

## 2015-07-13 NOTE — ED Notes (Signed)
Patient was given 4 ounces of orange juice

## 2015-07-13 NOTE — ED Notes (Signed)
Pt was given 4 ounces apple juice, [place] sandwich, apple and 1 pack graham crackers

## 2015-07-13 NOTE — ED Notes (Signed)
Pt was asked if she had taken any insulin - pt denied taking insulin.  Pt was asked what she has eaten today.  Pt responded she has eaten a cheese omlet, and a few oranges.

## 2015-07-13 NOTE — ED Notes (Signed)
Patient was given 4 ounces of apple juice

## 2015-07-13 NOTE — ED Notes (Signed)
Patient presents via POV from home. Patient reported to have been altered and "talking out of her head" - CBG check at home was reported to be 20. Patient arrives verbal and combative stating, "You crazy. You all crazy." Skin is warm and dry.

## 2015-07-13 NOTE — H&P (Signed)
[facility] NAME: Roselynne Devoss    MR#:  [id number]  DATE OF BIRTH:  1930/08/23  DATE OF ADMISSION:  07/13/2015  PRIMARY CARE PHYSICIAN: Madelyn Brunner, MD   REQUESTING/REFERRING PHYSICIAN: Dr Jacqualine Code  CHIEF COMPLAINT:  Altered mental status  HISTORY OF PRESENT ILLNESS:  Tanairi Geno  is a 81 y.o. female with a known history of type 2 diabetes, hyperlipidemia comes to the emergency room accompanied by her nieces after she was found not answering the door to family members this evening. Patient's husband had to get into the house from the back window and found patient confused and talking out of her head. The check the sugar at home and it was 20. The provider the emergency room received 1 amp of D50. Her sugars came up to 67. Patient had a [place] sandwich and a couple cups of juices. Her sugars hanging around the 70s. And internal medicine was consulted for admission due to hypoglycemia Patient at present does not want to get admitted. She was convinced to get admitted by me and patient's nieces. She is unable to remember any of her meds clearly asked her how she takes it. Per family there is possibly patient may not be taking her meds appropriately. No signs of overdose.  PAST MEDICAL HISTORY:   Past Medical History  Diagnosis Date  . Diabetes mellitus without complication ([place])   . Onychomycosis 02/04/2013    PAST SURGICAL HISTOIRY:   Past Surgical History  Procedure Laterality Date  . Neck artery    . Colonoscopy      SOCIAL HISTORY:   Social History  Substance Use Topics  . Smoking status: Former Research scientist (life sciences)  . Smokeless tobacco: Never Used     Comment: quit over 40 years ago  . Alcohol Use: No    FAMILY HISTORY:  No family history on file.  DRUG ALLERGIES:   Allergies  Allergen Reactions  . Sulfa Antibiotics     INCREASES HER BLOOD SUGAR , PT STATED     REVIEW OF SYSTEMS:  Review of Systems   Constitutional: Negative for fever, chills and weight loss.  HENT: Negative for ear discharge, ear pain and nosebleeds.   Eyes: Negative for blurred vision, pain and discharge.  Respiratory: Negative for sputum production, shortness of breath, wheezing and stridor.   Cardiovascular: Negative for chest pain, palpitations, orthopnea and PND.  Gastrointestinal: Negative for nausea, vomiting, abdominal pain and diarrhea.  Genitourinary: Negative for urgency and frequency.  Musculoskeletal: Negative for back pain and joint pain.  Neurological: Negative for sensory change, speech change, focal weakness and weakness.  Psychiatric/Behavioral: Negative for depression and hallucinations. The patient is not nervous/anxious.   All other systems reviewed and are negative.    MEDICATIONS AT HOME:   Prior to Admission medications   Medication Sig Start Date End Date Taking? Authorizing Provider  aspirin EC 81 MG tablet Take 81 mg by mouth daily.    Yes Historical Provider, MD  Fe Fum-FePoly-Vit C-Vit B3 (INTEGRA) 62.5-62.5-40-3 MG CAPS Take 1 capsule by mouth daily.    Yes Historical Provider, MD  gabapentin (NEURONTIN) 300 MG capsule Take 300 mg by mouth at bedtime.    Yes Historical Provider, MD  glipiZIDE (GLUCOTROL) 5 MG tablet Take 5 mg by mouth 2 (two) times daily before a meal.    Yes Historical Provider, MD  levothyroxine (SYNTHROID, LEVOTHROID) 100 MCG tablet Take 100 mcg by mouth daily before breakfast.  Yes Historical Provider, MD  magnesium oxide (MAG-OX) 400 MG tablet Take 400 mg by mouth daily.    Yes Historical Provider, MD  metFORMIN (GLUCOPHAGE) 1000 MG tablet Take 1,000 mg by mouth 2 (two) times daily with a meal.  01/26/14  Yes Historical Provider, MD  simvastatin (ZOCOR) 40 MG tablet Take 40 mg by mouth at bedtime.    Yes Historical Provider, MD      VITAL SIGNS:  Blood pressure 150/76, pulse 57, temperature 97.4 F (36.3 C), temperature source Oral, resp. rate 16, height 5\' 8"   (1.727 m), weight 150 lb (68.04 kg), SpO2 99 %.  PHYSICAL EXAMINATION:  GENERAL:  81 y.o.-year-old patient lying in the bed with no acute distress.  EYES: Pupils equal, round, reactive to light and accommodation. No scleral icterus. Extraocular muscles intact.  HEENT: Head atraumatic, normocephalic. Oropharynx and nasopharynx clear.  NECK:  Supple, no jugular venous distention. No thyroid enlargement, no tenderness.  LUNGS: Normal breath sounds bilaterally, no wheezing, rales,rhonchi or crepitation. No use of accessory muscles of respiration.  CARDIOVASCULAR: S1, S2 normal. No murmurs, rubs, or gallops.  ABDOMEN: Soft, nontender, nondistended. Bowel sounds present. No organomegaly or mass.  EXTREMITIES: No pedal edema, cyanosis, or clubbing.  NEUROLOGIC: Cranial nerves II through XII are intact. Muscle strength 5/5 in all extremities. Sensation intact. Gait not checked.  PSYCHIATRIC: The patient is alert and oriented x 3.  SKIN: No obvious rash, lesion, or ulcer.   LABORATORY PANEL:   CBC  Recent Labs Lab 07/13/15 2225  WBC 6.0  HGB 12.7  HCT 40.4  PLT 259   ------------------------------------------------------------------------------------------------------------------  Chemistries   Recent Labs Lab 07/13/15 2225  NA 139  K 3.9  CL 105  CO2 28  GLUCOSE 112*  BUN 11  CREATININE 0.69  CALCIUM 9.2    IMPRESSION AND PLAN:   Tama Waltermire  is a 81 y.o. female with a known history of type 2 diabetes, hyperlipidemia comes to the emergency room accompanied by her nieces after she was found not answering the door to family members this evening. Patient's husband had to get into the house from the back window and found patient confused and talking out of her head. The check the sugar at home and it was 20.  1. Acute hypoglycemia. -Patient has known history of diabetes she takes glipizide and metformin. She is not on any home insulin. Currently she is not able to tell if she  took any extra doses of her diabetes meds. -She was found to have sugars in the 20s. Last sugar was 72. -She is being admitted for overnight observation -Sliding-scale insulin -Hold by mouth diabetes meds  2. Hyper lipidemia Continue statins  3. Hypothyroidism continue Synthroid  4. DVT prophylaxis subcutaneous Lovenox  5. Care management consult for discharge planning  Above was discussed with patient was reluctant to get admitted. Patient's nieces were in the ER. Admission plan discussed.   All the records are reviewed and case discussed with ED provider. Management plans discussed with the patient, family and they are in agreement.  CODE STATUS: Full  TOTAL TIME TAKING CARE OF THIS PATIENT: 45 minutes.    Ashtin Melichar M.D on 07/13/2015 at 11:54 PM  Between 7am to 6pm - Pager - [phone]  After 6pm go to [email or web address] - password [email or web address] [facility]  Office  [phone]  CC: Primary care physician; Madelyn Brunner, MD

## 2015-07-14 LAB — GLUCOSE, CAPILLARY
GLUCOSE-CAPILLARY: 117 mg/dL — AB (ref 65–99)
GLUCOSE-CAPILLARY: 60 mg/dL — AB (ref 65–99)
GLUCOSE-CAPILLARY: 95 mg/dL (ref 65–99)
Glucose-Capillary: 163 mg/dL — ABNORMAL HIGH (ref 65–99)
Glucose-Capillary: 141 mg/dL — ABNORMAL HIGH (ref 65–99)

## 2015-07-14 LAB — BASIC METABOLIC PANEL
ANION GAP: 4 — AB (ref 5–15)
BUN: 11 mg/dL (ref 6–20)
CALCIUM: 8.7 mg/dL — AB (ref 8.9–10.3)
CO2: 27 mmol/L (ref 22–32)
Chloride: 109 mmol/L (ref 101–111)
Creatinine, Ser: 0.6 mg/dL (ref 0.44–1.00)
Glucose, Bld: 84 mg/dL (ref 65–99)
Potassium: 3.8 mmol/L (ref 3.5–5.1)
Sodium: 140 mmol/L (ref 135–145)

## 2015-07-14 MED ORDER — MAGNESIUM OXIDE 400 (241.3 MG) MG PO TABS
400.0000 mg | ORAL_TABLET | Freq: Every day | ORAL | Status: DC
  Administered 2015-07-14: 400 mg via ORAL
  Filled 2015-07-14: qty 1

## 2015-07-14 MED ORDER — ENOXAPARIN SODIUM 40 MG/0.4ML ~~LOC~~ SOLN
40.0000 mg | SUBCUTANEOUS | Status: DC
  Administered 2015-07-14: 40 mg via SUBCUTANEOUS
  Filled 2015-07-14: qty 0.4

## 2015-07-14 MED ORDER — ONDANSETRON HCL 4 MG PO TABS: 4.0000 mg | ORAL_TABLET | Freq: Four times a day (QID) | ORAL | Status: DC | PRN

## 2015-07-14 MED ORDER — ACETAMINOPHEN 325 MG PO TABS: 650.0000 mg | ORAL_TABLET | Freq: Four times a day (QID) | ORAL | Status: DC | PRN

## 2015-07-14 MED ORDER — SIMVASTATIN 40 MG PO TABS
40.0000 mg | ORAL_TABLET | Freq: Every day | ORAL | Status: DC
  Administered 2015-07-14: 40 mg via ORAL
  Filled 2015-07-14: qty 1

## 2015-07-14 MED ORDER — INSULIN ASPART 100 UNIT/ML ~~LOC~~ SOLN: 0.0000 [IU] | Freq: Three times a day (TID) | SUBCUTANEOUS | Status: DC

## 2015-07-14 MED ORDER — GABAPENTIN 300 MG PO CAPS
300.0000 mg | ORAL_CAPSULE | Freq: Every day | ORAL | Status: DC
  Administered 2015-07-14: 300 mg via ORAL
  Filled 2015-07-14: qty 1

## 2015-07-14 MED ORDER — ONDANSETRON HCL 4 MG/2ML IJ SOLN: 4.0000 mg | Freq: Four times a day (QID) | INTRAMUSCULAR | Status: DC | PRN

## 2015-07-14 MED ORDER — ACETAMINOPHEN 650 MG RE SUPP: 650.0000 mg | Freq: Four times a day (QID) | RECTAL | Status: DC | PRN

## 2015-07-14 MED ORDER — DEXTROSE 50 % IV SOLN
1.0000 | Freq: Once | INTRAVENOUS | Status: AC
  Administered 2015-07-14: 50 mL via INTRAVENOUS
  Filled 2015-07-14: qty 50

## 2015-07-14 MED ORDER — ASPIRIN EC 81 MG PO TBEC
81.0000 mg | DELAYED_RELEASE_TABLET | Freq: Every day | ORAL | Status: DC
  Administered 2015-07-14: 81 mg via ORAL
  Filled 2015-07-14: qty 1

## 2015-07-14 MED ORDER — LEVOTHYROXINE SODIUM 50 MCG PO TABS
100.0000 ug | ORAL_TABLET | Freq: Every day | ORAL | Status: DC
  Administered 2015-07-14: 100 ug via ORAL
  Filled 2015-07-14: qty 2

## 2015-07-14 MED ORDER — FE FUMARATE-B12-VIT C-FA-IFC PO CAPS
1.0000 | ORAL_CAPSULE | Freq: Every day | ORAL | Status: DC
  Administered 2015-07-14: 1 via ORAL
  Filled 2015-07-14: qty 1

## 2015-07-14 NOTE — Discharge Planning (Signed)
Pt IV removed. DC papers given, explained and educated.  Discussed LOW BS s/sx and what to do.  Also discussed need to check BS at home and contact someone if feeling that way.  Sending home with script for glucometer and suggestions for FU appt. VSS and RN assessment revealed stability for DC to home. Pt will be walked to front and family assisting with transport home via car.

## 2015-07-14 NOTE — ED Notes (Signed)
Poison control returned call back, gave recent CBG's and vitals.

## 2015-07-14 NOTE — Progress Notes (Signed)
Spoke with MD Fritzi Mandes for blood sugar 55. Order for 1 amp d50 and recheck blood sugar, apple juice also provided to patient.  Patient is A+O with no signs of distress.

## 2015-07-14 NOTE — Discharge Summary (Signed)
[facility] NAME: Janet Gross    MR#:  [id number]  DATE OF BIRTH:  [birth year]  DATE OF ADMISSION:  07/13/2015 ADMITTING PHYSICIAN: Fritzi Mandes, MD  DATE OF DISCHARGE: 07/14/2015  PRIMARY CARE PHYSICIAN: Madelyn Brunner, MD    ADMISSION DIAGNOSIS:  Hypoglycemia [E16.2] Sulfonylurea poisoning, accidental or unintentional, initial encounter [id number]  DISCHARGE DIAGNOSIS:  Active Problems:   Hypoglycemia   SECONDARY DIAGNOSIS:   Past Medical History  Diagnosis Date  . Diabetes mellitus without complication ([place])   . Onychomycosis 02/04/2013    HOSPITAL COURSE:   Janet Gross is a 81 y.o. female with a known history of type 2 diabetes, hyperlipidemia admitted for hypoglycemia.  1. Acute hypoglycemia. Poor by mouth intake and also on glipizide and metformin. -Sugars improved. Not on any D5 drip. -Completely alert and oriented. Continue to hold glipizide and metformin at discharge. -Similar episode last year. Glipizide might not be a good medicine for her. Advised to see PCP next week and see if metformin can be restarted -Continue to monitor sugars this morning and ambulate patient. If stable, discharge today. -Check A1c  2. Hyper lipidemia Continue statins  3. Hypothyroidism continue Synthroid  Discharge today. Plan discussed with son at bedside. Patient is independent at baseline.  DISCHARGE CONDITIONS:   Stable  CONSULTS OBTAINED:   None  DRUG ALLERGIES:   Allergies  Allergen Reactions  . Sulfa Antibiotics     INCREASES HER BLOOD SUGAR , PT STATED     DISCHARGE MEDICATIONS:   Current Discharge Medication List    CONTINUE these medications which have NOT CHANGED   Details  aspirin EC 81 MG tablet Take 81 mg by mouth daily.     Fe Fum-FePoly-Vit C-Vit B3 (INTEGRA) 62.5-62.5-40-3 MG CAPS Take 1 capsule by mouth daily.     gabapentin (NEURONTIN) 300 MG capsule Take 300 mg by mouth  at bedtime.     levothyroxine (SYNTHROID, LEVOTHROID) 100 MCG tablet Take 100 mcg by mouth daily before breakfast.     magnesium oxide (MAG-OX) 400 MG tablet Take 400 mg by mouth daily.     simvastatin (ZOCOR) 40 MG tablet Take 40 mg by mouth at bedtime.       STOP taking these medications     glipiZIDE (GLUCOTROL) 5 MG tablet      metFORMIN (GLUCOPHAGE) 1000 MG tablet          DISCHARGE INSTRUCTIONS:   1. PCP f/u in 1 week   If you experience worsening of your admission symptoms, develop shortness of breath, life threatening emergency, suicidal or homicidal thoughts you must seek medical attention immediately by calling 911 or calling your MD immediately  if symptoms less severe.  You Must read complete instructions/literature along with all the possible adverse reactions/side effects for all the Medicines you take and that have been prescribed to you. Take any new Medicines after you have completely understood and accept all the possible adverse reactions/side effects.   Please note  You were cared for by a hospitalist during your hospital stay. If you have any questions about your discharge medications or the care you received while you were in the hospital after you are discharged, you can call the unit and asked to speak with the hospitalist on call if the hospitalist that took care of you is not available. Once you are discharged, your primary care physician will handle any further medical issues. Please note that NO REFILLS  for any discharge medications will be authorized once you are discharged, as it is imperative that you return to your primary care physician (or establish a relationship with a primary care physician if you do not have one) for your aftercare needs so that they can reassess your need for medications and monitor your lab values.    Today   CHIEF COMPLAINT:   Chief Complaint  Patient presents with  . Hypoglycemia    family brought in pt stating pts  blood sugar is 20 with AMS    VITAL SIGNS:  Blood pressure 150/64, pulse 69, temperature 97.7 F (36.5 C), temperature source Oral, resp. rate 17, height 5\' 8"  (1.727 m), weight 68.04 kg (150 lb), SpO2 99 %.  I/O:   Intake/Output Summary (Last 24 hours) at 07/14/15 0815 Last data filed at 07/14/15 0410  Gross per 24 hour  Intake    240 ml  Output      2 ml  Net    238 ml    PHYSICAL EXAMINATION:   Physical Exam  GENERAL:  81 y.o.-year-old patient lying in the bed with no acute distress.  EYES: Pupils equal, round, reactive to light and accommodation. No scleral icterus. Extraocular muscles intact.  HEENT: Head atraumatic, normocephalic. Oropharynx and nasopharynx clear.  NECK:  Supple, no jugular venous distention. No thyroid enlargement, no tenderness.  LUNGS: Normal breath sounds bilaterally, no wheezing, rales,rhonchi or crepitation. No use of accessory muscles of respiration.  CARDIOVASCULAR: S1, S2 normal. No rubs, or gallops. 3/6 systolic murmur present ABDOMEN: Soft, non-tender, non-distended. Bowel sounds present. No organomegaly or mass.  EXTREMITIES: No pedal edema, cyanosis, or clubbing.  NEUROLOGIC: Cranial nerves II through XII are intact. Muscle strength 5/5 in all extremities. Sensation intact. Gait not checked.  PSYCHIATRIC: The patient is alert and oriented x 3.  SKIN: No obvious rash, lesion, or ulcer.   DATA REVIEW:   CBC  Recent Labs Lab 07/13/15 2225  WBC 6.0  HGB 12.7  HCT 40.4  PLT 259    Chemistries   Recent Labs Lab 07/14/15 0535  NA 140  K 3.8  CL 109  CO2 27  GLUCOSE 84  BUN 11  CREATININE 0.60  CALCIUM 8.7*    Cardiac Enzymes No results for input(s): TROPONINI in the last 168 hours.  Microbiology Results  No results found for this or any previous visit.  RADIOLOGY:  No results found.  EKG:   Orders placed or performed during the hospital encounter of 07/13/15  . ED EKG  . ED EKG  . EKG 12-Lead  . EKG 12-Lead       Management plans discussed with the patient, family and they are in agreement.  CODE STATUS:     Code Status Orders        Start     Ordered   07/14/15 0054  Full code   Continuous     07/14/15 0053      TOTAL TIME TAKING CARE OF THIS PATIENT: 37 minutes.    Gladstone Lighter M.D on 07/14/2015 at 8:15 AM  Between 7am to 6pm - Pager - [phone]  After 6pm go to [email or web address] - password [email or web address]  Office  [phone]  CC: Primary care physician; Madelyn Brunner, MD

## 2015-07-19 LAB — GLUCOSE, CAPILLARY: GLUCOSE-CAPILLARY: 65 mg/dL (ref 65–99)

## 2015-08-30 ENCOUNTER — Ambulatory Visit (INDEPENDENT_AMBULATORY_CARE_PROVIDER_SITE_OTHER): Payer: Medicare Other | Admitting: Sports Medicine

## 2015-08-30 ENCOUNTER — Encounter: Payer: Self-pay | Admitting: Sports Medicine

## 2015-08-30 DIAGNOSIS — Q828 Other specified congenital malformations of skin: Secondary | ICD-10-CM | POA: Diagnosis not present

## 2015-08-30 DIAGNOSIS — M2040 Other hammer toe(s) (acquired), unspecified foot: Secondary | ICD-10-CM

## 2015-08-30 DIAGNOSIS — E119 Type 2 diabetes mellitus without complications: Secondary | ICD-10-CM

## 2015-08-30 DIAGNOSIS — M79676 Pain in unspecified toe(s): Secondary | ICD-10-CM

## 2015-08-30 NOTE — Progress Notes (Signed)
Patient ID: Janet Gross, female   DOB: 1931-05-10, 82 y.o.   MRN: [id number] Subjective: Janet Gross is a 82 y.o. female patient with history of type 2 diabetes who presents to office today complaining of painful callus in 4th webspace on right foot; unable to trim. States that she just had her nails done; was hospitalized for low blood sugar in 50's. Patient denies any new changes in medication or new problems. Patient denies any new cramping, numbness, burning or tingling in the legs.  Patient Active Problem List   Diagnosis Date Noted  . Hypoglycemia 07/13/2015  . Onychomycosis    Current Outpatient Prescriptions on File Prior to Visit  Medication Sig Dispense Refill  . aspirin EC 81 MG tablet Take 81 mg by mouth daily.     . Fe Fum-FePoly-Vit C-Vit B3 (INTEGRA) 62.5-62.5-40-3 MG CAPS Take 1 capsule by mouth daily.     Marland Kitchen gabapentin (NEURONTIN) 300 MG capsule Take 300 mg by mouth at bedtime.     Marland Kitchen levothyroxine (SYNTHROID, LEVOTHROID) 100 MCG tablet Take 100 mcg by mouth daily before breakfast.     . magnesium oxide (MAG-OX) 400 MG tablet Take 400 mg by mouth daily.     . simvastatin (ZOCOR) 40 MG tablet Take 40 mg by mouth at bedtime.      No current facility-administered medications on file prior to visit.   Allergies  Allergen Reactions  . Sulfa Antibiotics Other (See Comments)    INCREASES HER BLOOD SUGAR , PT STATED  INCREASES HER BLOOD SUGAR , PT STATED    Objective: General: Patient is awake, alert, and oriented x 3 and in no acute distress.  Integument: Skin is warm, dry and supple bilateral. Nails are short and thickened and  dystrophic with subungual debris, consistent with onychomycosis, 1-5 bilateral. No signs of infection. No open lesions. +interdigital callus with nucleated core at medial aspect of 5th toe at 4th interspace with no signs of infection. Remaining integument unremarkable.  Vasculature:  Dorsalis Pedis pulse 1/4 bilateral. Posterior Tibial pulse   1/4 bilateral.  Capillary fill time <4 sec 1-5 bilateral.No hair growth to the level of the digits. Temperature gradient within normal limits. No varicosities present bilateral. No edema present bilateral.   Neurology: The patient has intact sensation measured with a 5.07/10g Semmes Weinstein Monofilament at all pedal sites bilateral . Vibratory sensation diminished bilateral with tuning fork. No Babinski sign present bilateral.   Musculoskeletal: Asymptomatic mild hammertoe pedal deformities noted bilateral. Muscular strength 5/5 in all lower extremity muscular groups bilateral without pain or limitation on range of motion . No tenderness with calf compression bilateral.  Assessment and Plan: Problem List Items Addressed This Visit    None    Visit Diagnoses    Porokeratosis    -  Primary    Hammer toe, unspecified laterality        Pain of toe, unspecified laterality        Diabetes mellitus without complication ([place])        with episodes of Hypoglycemia      -Examined patient. -Discussed and educated patient on diabetic foot care, especially with  regards to the vascular, neurological and musculoskeletal systems.  -Stressed the importance of good glycemic control and the detriment of not  controlling glucose levels in relation to the foot. -Mechanically debrided keratosis at 4th interspace on right without incident and gave patient toe spacer to use; educated patient on likelihood of recurrence secondary to hammertoe deformity  -Advised  wider and good supportive shoes -Answered all patient questions -Patient to return  in 3 months for at risk foot care -Patient advised to call the office if any problems or questions arise in the meantime.  Landis Martins, DPM

## 2015-09-19 ENCOUNTER — Ambulatory Visit: Payer: Medicare Other | Admitting: Podiatry

## 2015-11-29 ENCOUNTER — Encounter: Payer: Self-pay | Admitting: Sports Medicine

## 2015-11-29 ENCOUNTER — Ambulatory Visit (INDEPENDENT_AMBULATORY_CARE_PROVIDER_SITE_OTHER): Payer: Medicare Other | Admitting: Sports Medicine

## 2015-11-29 DIAGNOSIS — Q828 Other specified congenital malformations of skin: Secondary | ICD-10-CM | POA: Diagnosis not present

## 2015-11-29 DIAGNOSIS — E119 Type 2 diabetes mellitus without complications: Secondary | ICD-10-CM

## 2015-11-29 DIAGNOSIS — M79676 Pain in unspecified toe(s): Secondary | ICD-10-CM

## 2015-11-29 DIAGNOSIS — M2040 Other hammer toe(s) (acquired), unspecified foot: Secondary | ICD-10-CM

## 2015-11-29 DIAGNOSIS — B351 Tinea unguium: Secondary | ICD-10-CM | POA: Diagnosis not present

## 2015-11-29 NOTE — Progress Notes (Signed)
Patient ID: Janet Gross, female   DOB: May 19, 1931, 82 y.o.   MRN: [id number]   Subjective: Janet Gross is a 82 y.o. female patient with history of type 2 diabetes who presents to office today complaining of painful nails and callus in 4th webspace on right foot; unable to trim. FBS not recorded. Patient denies any new changes in medication or new problems. Patient denies any new cramping, numbness, burning or tingling in the legs.  Patient Active Problem List   Diagnosis Date Noted  . Hypoglycemia 07/13/2015  . Controlled type 2 diabetes mellitus without complication ([place]) [id number]  . Gastric ulcer 02/22/2014  . HLD (hyperlipidemia) 02/22/2014  . Adult hypothyroidism 02/22/2014  . Adiposity 02/22/2014  . Osteoporosis, 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  . Onychomycosis    Current Outpatient Prescriptions on File Prior to Visit  Medication Sig Dispense Refill  . aspirin EC 81 MG tablet Take 81 mg by mouth daily.     . Fe Fum-FePoly-Vit C-Vit B3 (INTEGRA) 62.5-62.5-40-3 MG CAPS Take 1 capsule by mouth daily.     Marland Kitchen gabapentin (NEURONTIN) 300 MG capsule Take 300 mg by mouth at bedtime.     Marland Kitchen levothyroxine (SYNTHROID, LEVOTHROID) 100 MCG tablet Take 100 mcg by mouth daily before breakfast.     . magnesium oxide (MAG-OX) 400 MG tablet Take 400 mg by mouth daily.     . simvastatin (ZOCOR) 40 MG tablet Take 40 mg by mouth at bedtime.      No current facility-administered medications on file prior to visit.   Allergies  Allergen Reactions  . Sulfa Antibiotics Other (See Comments)    INCREASES HER BLOOD SUGAR , PT STATED  INCREASES HER BLOOD SUGAR , PT STATED    Objective: General: Patient is awake, alert, and oriented x 3 and in no acute distress.  Integument: Skin is warm, dry and supple bilateral. Nails are elongated and thickened and  dystrophic with subungual debris, consistent with onychomycosis, 1-5 bilateral. No signs of infection. No open lesions. +interdigital callus with  nucleated core at medial aspect of 5th toe at 4th interspace with no signs of infection. Remaining integument unremarkable.  Vasculature:  Dorsalis Pedis pulse 1/4 bilateral. Posterior Tibial pulse  1/4 bilateral.  Capillary fill time <4 sec 1-5 bilateral.No hair growth to the level of the digits. Temperature gradient within normal limits. No varicosities present bilateral. No edema present bilateral.   Neurology: The patient has intact sensation measured with a 5.07/10g Semmes Weinstein Monofilament at all pedal sites bilateral . Vibratory sensation slightly diminished bilateral with tuning fork. No Babinski sign present bilateral.   Musculoskeletal: Asymptomatic mild hammertoe pedal deformities noted bilateral. Muscular strength 5/5 in all lower extremity muscular groups bilateral without pain or limitation on range of motion . No tenderness with calf compression bilateral.  Assessment and Plan: Problem List Items Addressed This Visit    None    Visit Diagnoses    Porokeratosis    -  Primary    Dermatophytosis of nail        Pain of toe, unspecified laterality        Hammer toe, unspecified laterality        Diabetes mellitus without complication ([place])          -Examined patient. -Discussed and educated patient on diabetic foot care, especially with  regards to the vascular, neurological and musculoskeletal systems.  -Stressed the importance of good glycemic control and the detriment of not  controlling glucose levels in relation to the  foot. -Mechanically debrided nails x 10 and keratosis at 4th interspace on right without incident and gave patient toe spacer to use; educated patient on likelihood of recurrence secondary to hammertoe deformity  -Advised wider and good supportive shoes -Answered all patient questions -Patient to return  in 3 months for at risk foot care -Patient advised to call the office if any problems or questions arise in the meantime.  Landis Martins, DPM

## 2016-02-28 ENCOUNTER — Encounter: Payer: Self-pay | Admitting: Sports Medicine

## 2016-02-28 ENCOUNTER — Ambulatory Visit (INDEPENDENT_AMBULATORY_CARE_PROVIDER_SITE_OTHER): Payer: Medicare Other | Admitting: Sports Medicine

## 2016-02-28 DIAGNOSIS — Q828 Other specified congenital malformations of skin: Secondary | ICD-10-CM | POA: Diagnosis not present

## 2016-02-28 DIAGNOSIS — E119 Type 2 diabetes mellitus without complications: Secondary | ICD-10-CM

## 2016-02-28 DIAGNOSIS — B351 Tinea unguium: Secondary | ICD-10-CM | POA: Diagnosis not present

## 2016-02-28 DIAGNOSIS — M79676 Pain in unspecified toe(s): Secondary | ICD-10-CM

## 2016-02-28 DIAGNOSIS — M2040 Other hammer toe(s) (acquired), unspecified foot: Secondary | ICD-10-CM

## 2016-02-28 NOTE — Progress Notes (Signed)
Patient ID: Janet Gross, female   DOB: 1930-11-18, 82 y.o.   MRN: [id number]   Subjective: Janet Gross is a 82 y.o. female patient with history of type 2 diabetes who presents to office today complaining of painful nails and callus in 4th webspace on right foot; unable to trim. FBS not recorded. Patient denies any new changes in medication or new problems. Patient denies any new cramping, numbness, burning or tingling in the legs.  Patient Active Problem List   Diagnosis Date Noted  . Hypoglycemia 07/13/2015  . Controlled type 2 diabetes mellitus without complication ([place]) [id number]  . Gastric ulcer 02/22/2014  . HLD (hyperlipidemia) 02/22/2014  . Adult hypothyroidism 02/22/2014  . Adiposity 02/22/2014  . Osteoporosis, 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  . Onychomycosis    Current Outpatient Prescriptions on File Prior to Visit  Medication Sig Dispense Refill  . alendronate (FOSAMAX) 70 MG tablet     . aspirin EC 81 MG tablet Take 81 mg by mouth daily.     . Fe Fum-FePoly-Vit C-Vit B3 (INTEGRA) 62.5-62.5-40-3 MG CAPS Take 1 capsule by mouth daily.     Marland Kitchen gabapentin (NEURONTIN) 300 MG capsule Take 300 mg by mouth at bedtime.     Marland Kitchen levothyroxine (SYNTHROID, LEVOTHROID) 100 MCG tablet Take 100 mcg by mouth daily before breakfast.     . levothyroxine (SYNTHROID, LEVOTHROID) 75 MCG tablet TAKE 1 TABLET BY MOUTH 30-60MINS BEFORE BREAKFAST ON EMPTY STOMACH WITH FULL GLASS OF WATER  5  . magnesium oxide (MAG-OX) 400 MG tablet Take 400 mg by mouth daily.     . meloxicam (MOBIC) 7.5 MG tablet TAKE 1 TABLET (7.5 MG TOTAL) BY MOUTH ONCE DAILY.  3  . simvastatin (ZOCOR) 40 MG tablet Take 40 mg by mouth at bedtime.      No current facility-administered medications on file prior to visit.    Allergies  Allergen Reactions  . Sulfa Antibiotics Other (See Comments)    INCREASES HER BLOOD SUGAR , PT STATED  INCREASES HER BLOOD SUGAR , PT STATED    Objective: General: Patient is awake, alert, and  oriented x 3 and in no acute distress.  Integument: Skin is warm, dry and supple bilateral. Nails are elongated and thickened and  dystrophic with subungual debris, consistent with onychomycosis, 1-5 bilateral. No signs of infection. No open lesions. +interdigital callus with nucleated core at medial aspect of 5th toe at 4th interspace with no signs of infection. Remaining integument unremarkable.  Vasculature:  Dorsalis Pedis pulse 1/4 bilateral. Posterior Tibial pulse  1/4 bilateral.  Capillary fill time <4 sec 1-5 bilateral.No hair growth to the level of the digits. Temperature gradient within normal limits. No varicosities present bilateral. No edema present bilateral.   Neurology: The patient has intact sensation measured with a 5.07/10g Semmes Weinstein Monofilament at all pedal sites bilateral . Vibratory sensation slightly diminished bilateral with tuning fork. No Babinski sign present bilateral.   Musculoskeletal: Asymptomatic mild hammertoe pedal deformities noted bilateral. Muscular strength 5/5 in all lower extremity muscular groups bilateral without pain or limitation on range of motion . No tenderness with calf compression bilateral.  Assessment and Plan: Problem List Items Addressed This Visit    None    Visit Diagnoses    Dermatophytosis of nail    -  Primary   Porokeratosis       Pain of toe, unspecified laterality       Hammer toe, unspecified laterality       Diabetes mellitus without complication (  [place])         -Examined patient. -Discussed and educated patient on diabetic foot care, especially with  regards to the vascular, neurological and musculoskeletal systems.  -Stressed the importance of good glycemic control and the detriment of not  controlling glucose levels in relation to the foot. -Mechanically debrided nails x 10 and keratosis at 4th interspace on right without incident. Cont with toe spacer to use; educated patient on likelihood of recurrence secondary  to hammertoe deformity  -Advised wider and good supportive shoes -Answered all patient questions -Patient to return  in 3 months for at risk foot care -Patient advised to call the office if any problems or questions arise in the meantime.  Landis Martins, DPM

## 2016-05-30 ENCOUNTER — Ambulatory Visit: Payer: Medicare Other | Admitting: Podiatry

## 2016-05-30 ENCOUNTER — Ambulatory Visit (INDEPENDENT_AMBULATORY_CARE_PROVIDER_SITE_OTHER): Payer: Medicare Other | Admitting: Podiatry

## 2016-05-30 ENCOUNTER — Encounter: Payer: Self-pay | Admitting: Podiatry

## 2016-05-30 DIAGNOSIS — M79676 Pain in unspecified toe(s): Secondary | ICD-10-CM

## 2016-05-30 DIAGNOSIS — B351 Tinea unguium: Secondary | ICD-10-CM

## 2016-05-30 DIAGNOSIS — Q828 Other specified congenital malformations of skin: Secondary | ICD-10-CM

## 2016-05-30 NOTE — Progress Notes (Signed)
She presents today chief complaint of painful elongated toenails corns and calluses.  Objective: Vital signs are stable she is alert and oriented 3. Toenails are thick yellow dystrophic with mycotic painful palpation multiple plantar keratomas plantar aspect of bilateral foot are not opened and there are no ulcerations.  Assessment: Pain in limb secondary to onychomycosis and porokeratosis.  Plan: Debridement of toenails 1 through 5 bilateral. Debridement of all reactive hyperkeratotic tissue with.

## 2016-06-27 ENCOUNTER — Inpatient Hospital Stay: Payer: Medicare Other

## 2016-06-27 ENCOUNTER — Inpatient Hospital Stay: Payer: Medicare Other | Attending: Internal Medicine | Admitting: Internal Medicine

## 2016-06-27 VITALS — BP 136/79 | HR 79 | Temp 98.8°F | Resp 18 | Wt 139.2 lb

## 2016-06-27 VITALS — BP 137/70 | HR 88 | Resp 18

## 2016-06-27 DIAGNOSIS — E785 Hyperlipidemia, unspecified: Secondary | ICD-10-CM | POA: Diagnosis not present

## 2016-06-27 DIAGNOSIS — D500 Iron deficiency anemia secondary to blood loss (chronic): Secondary | ICD-10-CM

## 2016-06-27 DIAGNOSIS — M810 Age-related osteoporosis without current pathological fracture: Secondary | ICD-10-CM | POA: Insufficient documentation

## 2016-06-27 DIAGNOSIS — Z79899 Other long term (current) drug therapy: Secondary | ICD-10-CM | POA: Diagnosis not present

## 2016-06-27 DIAGNOSIS — E039 Hypothyroidism, unspecified: Secondary | ICD-10-CM | POA: Insufficient documentation

## 2016-06-27 DIAGNOSIS — Z8711 Personal history of peptic ulcer disease: Secondary | ICD-10-CM | POA: Insufficient documentation

## 2016-06-27 DIAGNOSIS — N281 Cyst of kidney, acquired: Secondary | ICD-10-CM | POA: Diagnosis not present

## 2016-06-27 DIAGNOSIS — E119 Type 2 diabetes mellitus without complications: Secondary | ICD-10-CM | POA: Insufficient documentation

## 2016-06-27 DIAGNOSIS — D509 Iron deficiency anemia, unspecified: Secondary | ICD-10-CM | POA: Diagnosis not present

## 2016-06-27 LAB — IRON AND TIBC
IRON: 9 ug/dL — AB (ref 28–170)
SATURATION RATIOS: 2 % — AB (ref 10.4–31.8)
TIBC: 476 ug/dL — AB (ref 250–450)
UIBC: 467 ug/dL

## 2016-06-27 LAB — CBC WITH DIFFERENTIAL/PLATELET
Basophils Absolute: 0.1 10*3/uL (ref 0–0.1)
Basophils Relative: 2 %
EOS ABS: 0 10*3/uL (ref 0–0.7)
EOS PCT: 0 %
HCT: 19.8 % — ABNORMAL LOW (ref 35.0–47.0)
Hemoglobin: 6 g/dL — ABNORMAL LOW (ref 12.0–16.0)
LYMPHS ABS: 1.6 10*3/uL (ref 1.0–3.6)
LYMPHS PCT: 34 %
MCH: 19.1 pg — AB (ref 26.0–34.0)
MCHC: 30.4 g/dL — ABNORMAL LOW (ref 32.0–36.0)
MCV: 62.9 fL — AB (ref 80.0–100.0)
MONO ABS: 0.6 10*3/uL (ref 0.2–0.9)
Monocytes Relative: 13 %
Neutro Abs: 2.4 10*3/uL (ref 1.4–6.5)
Neutrophils Relative %: 51 %
PLATELETS: 385 10*3/uL (ref 150–440)
RBC: 3.14 MIL/uL — ABNORMAL LOW (ref 3.80–5.20)
RDW: 18.8 % — AB (ref 11.5–14.5)
WBC: 4.7 10*3/uL (ref 3.6–11.0)

## 2016-06-27 LAB — FERRITIN: Ferritin: 3 ng/mL — ABNORMAL LOW (ref 11–307)

## 2016-06-27 LAB — VITAMIN B12: Vitamin B-12: 182 pg/mL (ref 180–914)

## 2016-06-27 MED ORDER — SODIUM CHLORIDE 0.9 % IV SOLN
510.0000 mg | Freq: Once | INTRAVENOUS | Status: AC
  Administered 2016-06-27: 510 mg via INTRAVENOUS
  Filled 2016-06-27: qty 17

## 2016-06-27 MED ORDER — SODIUM CHLORIDE 0.9 % IV SOLN
Freq: Once | INTRAVENOUS | Status: AC
  Administered 2016-06-27: 14:00:00 via INTRAVENOUS
  Filled 2016-06-27: qty 1000

## 2016-06-27 NOTE — Progress Notes (Signed)
[facility]  [phone] [phone]  ID: Janet Gross OB: May 13, 1931  MR#: [id number]  [id number]  Patient Care Team: Lottie Mussel III, MD as PCP - General (Internal Medicine)  CHIEF COMPLAINT: New Evaluation (IDA)  HPI:  Routine visit to PCP revealed a hgb 6.3, iron at 14, therefore referred her. No trial of oral iron was given, no transfusion was attemepted. Stool occult negative She denies any bleeding, melena, no diarrhea, no change in bowel habits Has had no post menopausal bleeding or hematuria. No nausea/heratburn . Remote history of PUD, details not known, but reports having had no surgery She apparently was obese many years ago, but had lost weight over the years, and had maintained a stable weight over the past year,  She cannot recall having had a colonoscopy.  Results review ( External and Internal):  Cbc from [facility], hgb 6.8 10/19 and hgb 6.3 on 11/017/2017 mcv 67.7  wbc 3.7, platelets 327 TSH HIGH 6.95 SERUM iRON 14 CMP- wnl  LAB RESULTS:  Lab Results  Component Value Date   NA 140 07/14/2015   K 3.8 07/14/2015   CL 109 07/14/2015   CO2 27 07/14/2015   GLUCOSE 84 07/14/2015   BUN 11 07/14/2015   CREATININE 0.60 07/14/2015   CALCIUM 8.7 (L) 07/14/2015   GFRNONAA >60 07/14/2015   GFRAA >60 07/14/2015    Lab Results  Component Value Date   WBC 6.0 07/13/2015   HGB 12.7 07/13/2015   HCT 40.4 07/13/2015   MCV 79.3 (L) 07/13/2015   PLT 259 07/13/2015     STUDIES: I was unable to locate any colonscopsy or egd reports from her chart  On   A CT from 05/26/2016- There is a  large cyst involving the midportion of the left kidney measuring up to  4.98 cm x 5.68 cm with a Hounsfield reading of 10.0.   REVIEW OF SYSTEMS:   Review of Systems  Constitutional: Negative.   HENT: Negative.   Eyes: Negative.   Respiratory: Negative.   Cardiovascular: Negative.   Gastrointestinal: Negative.     Genitourinary: Negative.   Skin: Negative.   Neurological: Negative.   Endo/Heme/Allergies: Negative.   Psychiatric/Behavioral: Negative.   All other systems reviewed and are negative.   As per HPI. Otherwise, a complete review of systems is negative.  PAST MEDICAL HISTORY:  . Diabetes mellitus type 2, uncomplicated (CMS-HCC)  . Fibroid tumor  . Gastric ulcer  . Hyperlipidemia, unspecified  . Hypothyroidism, unspecified  . Osteoporosis, post-menopausal    Past Surgical History:   . CAROTID ENDARTERECTOMY Right  . CATARACT EXTRACTION Left August 2012  Extraction with lens implant  . COLONOSCOPY  09/12/04 (tubular adenoma); 06/27/12 (tubular adenoma, ph colonic polyps, repeat 3 years Dr. Candace Cruise)  . EGD  12/26/04; 06/27/12  . 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  Allergies  Allergen Reactions  . Sulfa Antibiotics Other (See Comments)    INCREASES HER BLOOD SUGAR , PT STATED  INCREASES HER BLOOD SUGAR , PT STATED     Vitals:   06/27/16 1125  BP: 136/79  Pulse: 79  Resp: 18  Temp: 98.8 F (37.1 C)     Body mass index is 21.17 kg/m.   1.74 meters squared    Physical Exam  Constitutional: She is oriented to person, place, and time. She appears well-developed and well-nourished. No distress.  HENT:  Head: Normocephalic and atraumatic.  Nose: Nose normal.  Mouth/Throat: Oropharynx is clear and moist.  Eyes: EOM are normal. Pupils are equal, round, and reactive to light.  Neck: Normal range of motion. Neck supple. No tracheal deviation present.  Cardiovascular: Normal rate and regular rhythm.   Pulmonary/Chest: Effort normal and breath sounds normal. No stridor.  Abdominal: Soft. Bowel sounds are normal. She exhibits no distension and no mass. There is no tenderness. There is no guarding.  Musculoskeletal: Normal range of motion. She exhibits no edema, tenderness or  deformity.  Lymphadenopathy:    She has no cervical adenopathy.  Neurological: She is alert and oriented to person, place, and time. No cranial nerve deficit or sensory deficit. Coordination normal.  Skin: Skin is warm. She is not diaphoretic. No erythema. There is pallor.  Psychiatric: She has a normal mood and affect. Her behavior is normal. Judgment and thought content normal.  Nursing note and vitals reviewed.   Impression and plan:  Severe Iron deficiency anemia with hgb 6.3. Patient is minimally symptomatic suggesting chronic anemia, last known normal hgb was about 11 with a normal MCV in 05/2015. It is too low to give a trail of oral iron at this time. We will proceed with Feraheme x 2 doses a week apart.  We will start today given the thanksgiving holidays. Alternatives such as blood transfusion and trail off oral iron have been given to her , benefits from IV iron  and sideeffects were explained, she agrees. Will check B12 level She knows to call or go the ED if she develops any symptoms of chest pain, Sob, or dizzy spells  Her records indicate that she had a COLONOSCOPY in 09/12/04 (tubular adenoma); 06/27/12 (tubular adenoma, ph colonic polyps, repeat 3 years Dr. Candace Cruise)  Actual reporst not available.  EGD 12/26/04; 06/27/12  H/o peptic ulcer many years ago, no record of surgery. GI referral has been made for a complete workup D/c Alendronate.  Large kidney cyst note don a CT  In 2014- will request a CT kidney to r/o kidney cancer -    urine analysis. for microsocpic hematuria  Return in 4 weeks to see MD with repeat Iron panel.  Orders Placed This Encounter  Procedures  . CT Abdomen Pelvis W Contrast  . CBC with Differential  . Ferritin  . Iron and TIBC  . Vitamin B12  . Urinalysis complete, with microscopic ([facility] only)  . Ambulatory referral to Gastroenterology    Patient expressed understanding and was in agreement with this plan. She also understands that She can call  clinic at any time with any questions, concerns, or complaints.    -------------------------------------------------------------------------------------------------------------------------------   This note was generated in part with voice recognition software and I apologize for any typographical errors that were not detected and corrected.    Creola Corn, MD   06/27/2016 1:42 PM

## 2016-06-27 NOTE — Progress Notes (Signed)
New evaluation for IDA. Offers no complaints.

## 2016-07-04 ENCOUNTER — Inpatient Hospital Stay: Payer: Medicare Other

## 2016-07-04 VITALS — BP 117/66 | HR 67 | Temp 97.5°F | Resp 18

## 2016-07-04 DIAGNOSIS — D500 Iron deficiency anemia secondary to blood loss (chronic): Secondary | ICD-10-CM | POA: Diagnosis not present

## 2016-07-04 MED ORDER — SODIUM CHLORIDE 0.9 % IV SOLN
510.0000 mg | Freq: Once | INTRAVENOUS | Status: AC
  Administered 2016-07-04: 510 mg via INTRAVENOUS
  Filled 2016-07-04: qty 17

## 2016-07-04 MED ORDER — SODIUM CHLORIDE 0.9 % IV SOLN
Freq: Once | INTRAVENOUS | Status: AC
  Administered 2016-07-04: 15:00:00 via INTRAVENOUS
  Filled 2016-07-04: qty 1000

## 2016-07-25 ENCOUNTER — Ambulatory Visit: Payer: Medicare Other

## 2016-07-25 ENCOUNTER — Other Ambulatory Visit: Payer: Medicare Other

## 2016-08-02 ENCOUNTER — Other Ambulatory Visit: Payer: Self-pay | Admitting: *Deleted

## 2016-08-02 DIAGNOSIS — D500 Iron deficiency anemia secondary to blood loss (chronic): Secondary | ICD-10-CM

## 2016-08-03 ENCOUNTER — Inpatient Hospital Stay: Payer: Medicare Other

## 2016-08-03 ENCOUNTER — Inpatient Hospital Stay (HOSPITAL_BASED_OUTPATIENT_CLINIC_OR_DEPARTMENT_OTHER): Payer: Medicare Other | Admitting: Oncology

## 2016-08-03 ENCOUNTER — Encounter: Payer: Self-pay | Admitting: Oncology

## 2016-08-03 ENCOUNTER — Inpatient Hospital Stay: Payer: Medicare Other | Attending: Oncology

## 2016-08-03 VITALS — BP 147/73 | HR 63 | Resp 20

## 2016-08-03 VITALS — BP 152/71 | HR 59 | Temp 97.0°F | Resp 18 | Wt 141.1 lb

## 2016-08-03 DIAGNOSIS — Z8711 Personal history of peptic ulcer disease: Secondary | ICD-10-CM | POA: Insufficient documentation

## 2016-08-03 DIAGNOSIS — Z79899 Other long term (current) drug therapy: Secondary | ICD-10-CM | POA: Diagnosis not present

## 2016-08-03 DIAGNOSIS — D509 Iron deficiency anemia, unspecified: Secondary | ICD-10-CM

## 2016-08-03 DIAGNOSIS — N281 Cyst of kidney, acquired: Secondary | ICD-10-CM

## 2016-08-03 DIAGNOSIS — Z7982 Long term (current) use of aspirin: Secondary | ICD-10-CM

## 2016-08-03 DIAGNOSIS — E538 Deficiency of other specified B group vitamins: Secondary | ICD-10-CM | POA: Diagnosis not present

## 2016-08-03 DIAGNOSIS — Z87891 Personal history of nicotine dependence: Secondary | ICD-10-CM | POA: Insufficient documentation

## 2016-08-03 DIAGNOSIS — D500 Iron deficiency anemia secondary to blood loss (chronic): Secondary | ICD-10-CM

## 2016-08-03 DIAGNOSIS — Z86010 Personal history of colonic polyps: Secondary | ICD-10-CM | POA: Insufficient documentation

## 2016-08-03 DIAGNOSIS — Z7984 Long term (current) use of oral hypoglycemic drugs: Secondary | ICD-10-CM | POA: Insufficient documentation

## 2016-08-03 DIAGNOSIS — E119 Type 2 diabetes mellitus without complications: Secondary | ICD-10-CM | POA: Diagnosis not present

## 2016-08-03 HISTORY — DX: Deficiency of other specified B group vitamins: E53.8

## 2016-08-03 LAB — CBC WITH DIFFERENTIAL/PLATELET
BASOS ABS: 0 10*3/uL (ref 0–0.1)
Basophils Relative: 1 %
EOS ABS: 0 10*3/uL (ref 0–0.7)
EOS PCT: 2 %
HCT: 31.9 % — ABNORMAL LOW (ref 35.0–47.0)
Hemoglobin: 9.9 g/dL — ABNORMAL LOW (ref 12.0–16.0)
LYMPHS ABS: 1.2 10*3/uL (ref 1.0–3.6)
Lymphocytes Relative: 36 %
MCH: 23.2 pg — AB (ref 26.0–34.0)
MCHC: 31 g/dL — ABNORMAL LOW (ref 32.0–36.0)
MCV: 74.8 fL — ABNORMAL LOW (ref 80.0–100.0)
Monocytes Absolute: 0.5 10*3/uL (ref 0.2–0.9)
Monocytes Relative: 13 %
Neutro Abs: 1.6 10*3/uL (ref 1.4–6.5)
Neutrophils Relative %: 48 %
PLATELETS: 223 10*3/uL (ref 150–440)
RBC: 4.27 MIL/uL (ref 3.80–5.20)
RDW: 28.9 % — ABNORMAL HIGH (ref 11.5–14.5)
WBC: 3.4 10*3/uL — AB (ref 3.6–11.0)

## 2016-08-03 LAB — URINALYSIS, COMPLETE (UACMP) WITH MICROSCOPIC
BACTERIA UA: NONE SEEN
Bilirubin Urine: NEGATIVE
GLUCOSE, UA: NEGATIVE mg/dL
HGB URINE DIPSTICK: NEGATIVE
KETONES UR: NEGATIVE mg/dL
Leukocytes, UA: NEGATIVE
Nitrite: NEGATIVE
PROTEIN: NEGATIVE mg/dL
Specific Gravity, Urine: 1.021 (ref 1.005–1.030)
pH: 6 (ref 5.0–8.0)

## 2016-08-03 LAB — IRON AND TIBC
Iron: 25 ug/dL — ABNORMAL LOW (ref 28–170)
SATURATION RATIOS: 7 % — AB (ref 10.4–31.8)
TIBC: 354 ug/dL (ref 250–450)
UIBC: 329 ug/dL

## 2016-08-03 LAB — FERRITIN: FERRITIN: 22 ng/mL (ref 11–307)

## 2016-08-03 MED ORDER — CYANOCOBALAMIN 1000 MCG/ML IJ SOLN: 1000.0000 ug | Freq: Once | INTRAMUSCULAR | Status: DC

## 2016-08-03 MED ORDER — SODIUM CHLORIDE 0.9 % IV SOLN
510.0000 mg | Freq: Once | INTRAVENOUS | Status: AC
  Administered 2016-08-03: 510 mg via INTRAVENOUS
  Filled 2016-08-03: qty 17

## 2016-08-03 NOTE — Progress Notes (Signed)
Patient is here for follow up, she is doing very well. She is not sure of what medications she is on but does know Dr. Gilford Rile has stopped one of the meds but not sure which.

## 2016-08-03 NOTE — Progress Notes (Signed)
Hematology/Oncology Consult note [facility]  Telephone:([phone] [phone]  Patient Care Team: Madelyn Brunner, MD as PCP - General (Internal Medicine)   Name of the patient: Janet Gross  [id number]  20-Dec-1930   Date of visit: 08/03/16  Diagnosis- iron deficiency anemia  Chief complaint/ Reason for visit- routine follow-up of iron deficiency anemia  Heme/Onc history: Patient was initially seen by Dr. Sherrine Maples for evaluation of iron deficiency anemia on 06/27/2016. At that time she was referred from her PCP for hemoglobin of 6.3 and iron saturation of 14%. CT chest from 05/26/2013 showed a large cyst involving the midportion of the left kidney measuring up to 4.9 x 5.6 cm. Last known normal hemoglobin was 11 on 05/26/2015. She was given 2 doses of ferraheme given that she was significantly anemic although minimally symptomatic.Marland Kitchen She has been seen by [facility] gastroenterology in the past. Patient apparently had a colonoscopy in 2006 which showed a tubular adenoma.Also had a repeat colonoscopy on 06/27/2012 which again showed tubular adenoma and a repeat colonoscopy in 3 years was recommended. She has also had EGD in 2006 and 2013. History of peptic ulcer many years ago. Labs on 06/27/2016 showed ferritin of 3, low iron of 9, elevated TIBC of 476. H&H were 6/19.8 with an MCV of 62.9. B12 levels were low normal at 182   Interval history- she is doing well and reports no complaints today. She states that she felt great and even when her hemoglobin was 6 and did not feel any significantly different now that her hemoglobin is better  ECOG PS- 1 Pain scale- no   Review of systems- Review of Systems  Constitutional: Negative for chills, fever, malaise/fatigue and weight loss.  HENT: Negative for congestion, ear discharge and nosebleeds.   Eyes: Negative for blurred vision.  Respiratory: Negative for cough, hemoptysis, sputum production, shortness of  breath and wheezing.   Cardiovascular: Negative for chest pain, palpitations, orthopnea and claudication.  Gastrointestinal: Negative for abdominal pain, blood in stool, constipation, diarrhea, heartburn, melena, nausea and vomiting.  Genitourinary: Negative for dysuria, flank pain, frequency, hematuria and urgency.  Musculoskeletal: Negative for back pain, joint pain and myalgias.  Skin: Negative for rash.  Neurological: Negative for dizziness, tingling, focal weakness, seizures, weakness and headaches.  Endo/Heme/Allergies: Does not bruise/bleed easily.  Psychiatric/Behavioral: Negative for depression and suicidal ideas. The patient does not have insomnia.      Current treatment- 2 doses of ferraheme  Allergies  Allergen Reactions  . Sulfa Antibiotics Other (See Comments)    INCREASES HER BLOOD SUGAR , PT STATED  INCREASES HER BLOOD SUGAR , PT STATED      Past Medical History:  Diagnosis Date  . Diabetes mellitus without complication ([place])   . Onychomycosis 02/04/2013  . Peptic ulcer      Past Surgical History:  Procedure Laterality Date  . COLONOSCOPY    . neck artery      Social History   Social History  . Marital status: Widowed    Spouse name: N/A  . Number of children: N/A  . Years of education: N/A   Occupational History  . Not on file.   Social History Main Topics  . Smoking status: Former Research scientist (life sciences)  . Smokeless tobacco: Never Used     Comment: quit over 40 years ago  . Alcohol use No  . Drug use: No  . Sexual activity: Not on file   Other Topics Concern  . Not on  file   Social History Narrative  . No narrative on file    No family history on file.   Current Outpatient Prescriptions:  .  aspirin EC 81 MG tablet, Take 81 mg by mouth daily. , Disp: , Rfl:  .  gabapentin (NEURONTIN) 300 MG capsule, Take 300 mg by mouth at bedtime. , Disp: , Rfl:  .  levothyroxine (SYNTHROID, LEVOTHROID) 100 MCG tablet, Take 100 mcg by mouth daily before breakfast.  , Disp: , Rfl:  .  magnesium oxide (MAG-OX) 400 MG tablet, Take 400 mg by mouth daily. , Disp: , Rfl:  .  metFORMIN (GLUCOPHAGE) 1000 MG tablet, Take 1,000 mg by mouth 2 (two) times daily with a meal., Disp: , Rfl:  .  simvastatin (ZOCOR) 40 MG tablet, Take 40 mg by mouth at bedtime. , Disp: , Rfl:   Physical exam:  Vitals:   08/03/16 0854  BP: (!) 152/71  Pulse: (!) 59  Resp: 18  Temp: 97 F (36.1 C)  TempSrc: Tympanic  Weight: 141 lb 1.5 oz (64 kg)   Physical Exam  Constitutional: She is oriented to person, place, and time and well-developed, well-nourished, and in no distress.  HENT:  Head: Normocephalic and atraumatic.  Eyes: EOM are normal. Pupils are equal, round, and reactive to light.  Neck: Normal range of motion.  Cardiovascular: Normal rate, regular rhythm and normal heart sounds.   Pulmonary/Chest: Effort normal and breath sounds normal.  Abdominal: Soft. Bowel sounds are normal.  Neurological: She is alert and oriented to person, place, and time.  Skin: Skin is warm and dry.     CMP Latest Ref Rng & Units 07/14/2015  Glucose 65 - 99 mg/dL 84  BUN 6 - 20 mg/dL 11  Creatinine 0.44 - 1.00 mg/dL 0.60  Sodium 135 - 145 mmol/L 140  Potassium 3.5 - 5.1 mmol/L 3.8  Chloride 101 - 111 mmol/L 109  CO2 22 - 32 mmol/L 27  Calcium 8.9 - 10.3 mg/dL 8.7(L)   CBC Latest Ref Rng & Units 06/27/2016  WBC 3.6 - 11.0 K/uL 4.7  Hemoglobin 12.0 - 16.0 g/dL 6.0(L)  Hematocrit 35.0 - 47.0 % 19.8(L)  Platelets 150 - 440 K/uL 385     Assessment and plan- Patient is a 82 y.o. female who sees [place] for iron deficiency anemia  1. Iron deficiency anemia- status post 2 doses of ferriheme. Her hemoglobin is significantly improved but she is still anemic and has microcytosis. I will therefore give her 2 more doses of ferriheme starting today. We have still not ascertained the etiology of her iron deficiency anemia and she will be seen GI soon. Given that she was significantly anemic she will  need a complete GI workup including EGD and colonoscopy plus minus capsule endoscopy if she is agreeable to it. We will also check a urinalysis today to rule out hematuria  2. B12 deficiency- patient was noted to have a low normal B12 levels during her last visit. We will give her monthly B12 injections for the next 2 months. I have also suggested that patient should start taking multivitamin tablets .    3. Kidney cyst- This was noted on CT thorax in 2014. There was no concern that this was a complex cyst. I will hold off on CT abdomen at this time but this can be considered by her primary care provider if need be.   4. I will see her back in 8 weeks' time with repeat CBC iron studies, ferritin  and B12 levels. Hopefully she would've had a GI workup by them   Visit Diagnosis 1. Iron deficiency anemia, unspecified iron deficiency anemia type   2. B12 deficiency      Dr. Randa Evens, MD, MPH [facility] Pager- [phone] 08/03/2016 8:04 AM

## 2016-08-03 NOTE — Addendum Note (Signed)
Addended by: Luella Cook on: 08/03/2016 10:31 AM   Modules accepted: Orders

## 2016-08-10 ENCOUNTER — Inpatient Hospital Stay: Payer: Medicare Other | Attending: Oncology

## 2016-08-31 ENCOUNTER — Inpatient Hospital Stay: Payer: Medicare Other | Attending: Oncology

## 2016-09-03 ENCOUNTER — Ambulatory Visit (INDEPENDENT_AMBULATORY_CARE_PROVIDER_SITE_OTHER): Payer: Medicare Other | Admitting: Podiatry

## 2016-09-03 ENCOUNTER — Encounter: Payer: Self-pay | Admitting: Podiatry

## 2016-09-03 DIAGNOSIS — B351 Tinea unguium: Secondary | ICD-10-CM | POA: Diagnosis not present

## 2016-09-03 DIAGNOSIS — Q828 Other specified congenital malformations of skin: Secondary | ICD-10-CM

## 2016-09-03 DIAGNOSIS — M79676 Pain in unspecified toe(s): Secondary | ICD-10-CM

## 2016-09-03 NOTE — Progress Notes (Signed)
She presents today with a chief complaint of painful elongated toenails with corns and calluses.  Objective: Pulses are strong and palpable. Toenails are thick yellow dystrophic with mycotic multiple porokeratosis plantar aspect bilateral foot.  Assessment: Diabetes mellitus with pain in limb significant onychomycosis porokeratosis.  Plan: Debridement of already hyperkeratotic tissue and toenails bilateral foot.

## 2016-09-28 ENCOUNTER — Inpatient Hospital Stay: Payer: Medicare Other

## 2016-09-28 ENCOUNTER — Inpatient Hospital Stay (HOSPITAL_BASED_OUTPATIENT_CLINIC_OR_DEPARTMENT_OTHER): Payer: Medicare Other | Admitting: Oncology

## 2016-09-28 ENCOUNTER — Inpatient Hospital Stay: Payer: Medicare Other | Attending: Oncology

## 2016-09-28 VITALS — BP 183/78 | HR 64 | Temp 98.1°F | Resp 18 | Wt 140.5 lb

## 2016-09-28 VITALS — BP 176/76 | HR 68 | Temp 98.1°F | Resp 18

## 2016-09-28 DIAGNOSIS — Z7982 Long term (current) use of aspirin: Secondary | ICD-10-CM | POA: Insufficient documentation

## 2016-09-28 DIAGNOSIS — Z87891 Personal history of nicotine dependence: Secondary | ICD-10-CM | POA: Diagnosis not present

## 2016-09-28 DIAGNOSIS — E119 Type 2 diabetes mellitus without complications: Secondary | ICD-10-CM | POA: Diagnosis not present

## 2016-09-28 DIAGNOSIS — D500 Iron deficiency anemia secondary to blood loss (chronic): Secondary | ICD-10-CM

## 2016-09-28 DIAGNOSIS — E538 Deficiency of other specified B group vitamins: Secondary | ICD-10-CM

## 2016-09-28 DIAGNOSIS — Z79899 Other long term (current) drug therapy: Secondary | ICD-10-CM | POA: Insufficient documentation

## 2016-09-28 DIAGNOSIS — Z7984 Long term (current) use of oral hypoglycemic drugs: Secondary | ICD-10-CM | POA: Insufficient documentation

## 2016-09-28 DIAGNOSIS — D509 Iron deficiency anemia, unspecified: Secondary | ICD-10-CM

## 2016-09-28 LAB — CBC WITH DIFFERENTIAL/PLATELET
BASOS PCT: 1 %
Basophils Absolute: 0 10*3/uL (ref 0–0.1)
EOS ABS: 0.1 10*3/uL (ref 0–0.7)
EOS PCT: 2 %
HCT: 36.6 % (ref 35.0–47.0)
Hemoglobin: 11.7 g/dL — ABNORMAL LOW (ref 12.0–16.0)
LYMPHS ABS: 1 10*3/uL (ref 1.0–3.6)
Lymphocytes Relative: 29 %
MCH: 25 pg — AB (ref 26.0–34.0)
MCHC: 32 g/dL (ref 32.0–36.0)
MCV: 78.1 fL — ABNORMAL LOW (ref 80.0–100.0)
MONO ABS: 0.3 10*3/uL (ref 0.2–0.9)
MONOS PCT: 9 %
Neutro Abs: 2.1 10*3/uL (ref 1.4–6.5)
Neutrophils Relative %: 59 %
PLATELETS: 236 10*3/uL (ref 150–440)
RBC: 4.69 MIL/uL (ref 3.80–5.20)
RDW: 22.9 % — AB (ref 11.5–14.5)
WBC: 3.5 10*3/uL — ABNORMAL LOW (ref 3.6–11.0)

## 2016-09-28 LAB — IRON AND TIBC
IRON: 47 ug/dL (ref 28–170)
Saturation Ratios: 14 % (ref 10.4–31.8)
TIBC: 327 ug/dL (ref 250–450)
UIBC: 281 ug/dL

## 2016-09-28 LAB — FERRITIN: Ferritin: 28 ng/mL (ref 11–307)

## 2016-09-28 LAB — VITAMIN B12: Vitamin B-12: 218 pg/mL (ref 180–914)

## 2016-09-28 MED ORDER — SODIUM CHLORIDE 0.9 % IV SOLN
Freq: Once | INTRAVENOUS | Status: AC
  Administered 2016-09-28: 15:00:00 via INTRAVENOUS
  Filled 2016-09-28: qty 1000

## 2016-09-28 MED ORDER — SODIUM CHLORIDE 0.9 % IV SOLN
510.0000 mg | Freq: Once | INTRAVENOUS | Status: AC
  Administered 2016-09-28: 510 mg via INTRAVENOUS
  Filled 2016-09-28: qty 17

## 2016-09-28 MED ORDER — CYANOCOBALAMIN 1000 MCG/ML IJ SOLN
1000.0000 ug | Freq: Once | INTRAMUSCULAR | Status: AC
  Administered 2016-09-28: 1000 ug via INTRAMUSCULAR
  Filled 2016-09-28: qty 1

## 2016-09-28 NOTE — Progress Notes (Signed)
Here for follow up-stated she is doing well

## 2016-09-28 NOTE — Progress Notes (Signed)
Hematology/Oncology Consult note [facility]  [phone] [phone]  Patient Care Team: Madelyn Brunner, MD as PCP - General (Internal Medicine)   Name of the patient: Janet Gross  [id number]  08-25-1930   Date of visit: 09/28/16  Diagnosis- iron deficiency anemia  Chief complaint/ Reason for visit- routine follow-up of iron deficiency anemia  Heme/Onc history: Patient was initially seen by Dr. Sherrine Maples for evaluation of iron deficiency anemia on 06/27/2016. At that time she was referred from her PCP for hemoglobin of 6.3 and iron saturation of 14%. CT chest from 05/26/2013 showed a large cyst involving the midportion of the left kidney measuring up to 4.9 x 5.6 cm. Last known normal hemoglobin was 11 on 05/26/2015. She was given 2 doses of ferraheme given that she was significantly anemic although minimally symptomatic.Marland Kitchen She has been seen by [facility] gastroenterology in the past. Patient apparently had a colonoscopy in 2006 which showed a tubular adenoma.Also had a repeat colonoscopy on 06/27/2012 which again showed tubular adenoma and a repeat colonoscopy in 3 years was recommended. She has also had EGD in 2006 and 2013. History of peptic ulcer many years ago. Labs on 06/27/2016 showed ferritin of 3, low iron of 9, elevated TIBC of 476. H&H were 6/19.8 with an MCV of 62.9. B12 levels were low normal at 182  Last dose of Feraheme was on 08/03/2016.  Interval history- she feels well and denies any complaints today  ECOG PS- 1 Pain scale- 0   Review of systems- Review of Systems  Constitutional: Negative for chills, fever, malaise/fatigue and weight loss.  HENT: Negative for congestion, ear discharge and nosebleeds.   Eyes: Negative for blurred vision.  Respiratory: Negative for cough, hemoptysis, sputum production, shortness of breath and wheezing.   Cardiovascular: Negative for chest pain, palpitations, orthopnea and claudication.    Gastrointestinal: Negative for abdominal pain, blood in stool, constipation, diarrhea, heartburn, melena, nausea and vomiting.  Genitourinary: Negative for dysuria, flank pain, frequency, hematuria and urgency.  Musculoskeletal: Negative for back pain, joint pain and myalgias.  Skin: Negative for rash.  Neurological: Negative for dizziness, tingling, focal weakness, seizures, weakness and headaches.  Endo/Heme/Allergies: Does not bruise/bleed easily.  Psychiatric/Behavioral: Negative for depression and suicidal ideas. The patient does not have insomnia.      Current treatment- 3 doses of IV iron  Allergies  Allergen Reactions  . Sulfa Antibiotics Other (See Comments)    INCREASES HER BLOOD SUGAR , PT STATED  INCREASES HER BLOOD SUGAR , PT STATED      Past Medical History:  Diagnosis Date  . B12 deficiency 08/03/2016  . Diabetes mellitus without complication ([place])   . Onychomycosis 02/04/2013  . Peptic ulcer      Past Surgical History:  Procedure Laterality Date  . COLONOSCOPY    . neck artery      Social History   Social History  . Marital status: Widowed    Spouse name: N/A  . Number of children: N/A  . Years of education: N/A   Occupational History  . Not on file.   Social History Main Topics  . Smoking status: Former Research scientist (life sciences)  . Smokeless tobacco: Never Used     Comment: quit over 40 years ago  . Alcohol use No  . Drug use: No  . Sexual activity: Not on file   Other Topics Concern  . Not on file   Social History Narrative  . No narrative on file  No family history on file.   Current Outpatient Prescriptions:  .  aspirin EC 81 MG tablet, Take 81 mg by mouth daily. , Disp: , Rfl:  .  ferrous sulfate 325 (65 FE) MG tablet, Take by mouth., Disp: , Rfl:  .  gabapentin (NEURONTIN) 300 MG capsule, Take 300 mg by mouth at bedtime. , Disp: , Rfl:  .  levothyroxine (SYNTHROID, LEVOTHROID) 100 MCG tablet, Take 100 mcg by mouth daily before breakfast. ,  Disp: , Rfl:  .  magnesium oxide (MAG-OX) 400 MG tablet, Take 400 mg by mouth daily. , Disp: , Rfl:  .  metFORMIN (GLUCOPHAGE) 1000 MG tablet, Take 1,000 mg by mouth 2 (two) times daily with a meal., Disp: , Rfl:  .  simvastatin (ZOCOR) 40 MG tablet, Take 40 mg by mouth at bedtime. , Disp: , Rfl:   Physical exam:  Vitals:   09/28/16 1403  BP: (!) 183/78  Pulse: 64  Resp: 18  Temp: 98.1 F (36.7 C)  TempSrc: Tympanic  Weight: 140 lb 8.7 oz (63.7 kg)   Physical Exam  Constitutional: She is oriented to person, place, and time and well-developed, well-nourished, and in no distress.  HENT:  Head: Normocephalic and atraumatic.  Eyes: EOM are normal. Pupils are equal, round, and reactive to light.  Neck: Normal range of motion.  Cardiovascular: Normal rate, regular rhythm and normal heart sounds.   Pulmonary/Chest: Effort normal and breath sounds normal.  Abdominal: Soft. Bowel sounds are normal.  Neurological: She is alert and oriented to person, place, and time.  Skin: Skin is warm and dry.     CMP Latest Ref Rng & Units 07/14/2015  Glucose 65 - 99 mg/dL 84  BUN 6 - 20 mg/dL 11  Creatinine 0.44 - 1.00 mg/dL 0.60  Sodium 135 - 145 mmol/L 140  Potassium 3.5 - 5.1 mmol/L 3.8  Chloride 101 - 111 mmol/L 109  CO2 22 - 32 mmol/L 27  Calcium 8.9 - 10.3 mg/dL 8.7(L)   CBC Latest Ref Rng & Units 09/28/2016  WBC 3.6 - 11.0 K/uL 3.5(L)  Hemoglobin 12.0 - 16.0 g/dL 11.7(L)  Hematocrit 35.0 - 47.0 % 36.6  Platelets 150 - 440 K/uL 236     Assessment and plan- Patient is a 83 y.o. female who sees [place] for the following issues  1. Iron deficiency anemia: H&H today improved to 11.7/36.6 after 3 doses of IV iron. She continues to have mild microcytosis. Iron studies from today are pending. We will give her 1 more dose of ferriheme given her mild anemia and microcytosis. We will see her back in 4 months time with repeat CBC and iron studies  2. B12 deficiency: B12 levels from today are pending.  She has not yet started taking oral B12 but was willing to start at this point. We will give her 1 dose of B12 today Im. Repeat levels in 4 months time. If levels are normal with oral supplementation she may not need continued parenteral B12  Visit Diagnosis No diagnosis found.   Dr. Randa Evens, MD, MPH [place] [facility] Pager- [phone] 09/28/2016 3:54 PM

## 2016-10-09 ENCOUNTER — Ambulatory Visit (INDEPENDENT_AMBULATORY_CARE_PROVIDER_SITE_OTHER): Payer: Medicare Other | Admitting: Podiatry

## 2016-10-09 ENCOUNTER — Encounter: Payer: Self-pay | Admitting: Podiatry

## 2016-10-09 DIAGNOSIS — S91109A Unspecified open wound of unspecified toe(s) without damage to nail, initial encounter: Secondary | ICD-10-CM

## 2016-10-09 DIAGNOSIS — L84 Corns and callosities: Secondary | ICD-10-CM | POA: Diagnosis not present

## 2016-10-24 NOTE — Progress Notes (Signed)
   Subjective:  83 year old female presents today for evaluation of pain between her fourth and fifth digits of the right foot. Sensation states that it hurts with pressure. This been going on for several weeks now.    Objective/Physical Exam General: The patient is alert and oriented x3 in no acute distress.  Dermatology: Maceration noted between the fourth and fifth digits of the right foot with a superficial ulceration noted consistent with a heloma Molle. Wound appears to be granular in nature. Negative for significant drainage. No malodor noted. There is no exposed bone muscle-tendon ligament or joint. Periwound is macerated and callused.  Vascular: Palpable pedal pulses bilaterally. No edema or erythema noted. Capillary refill within normal limits.  Neurological: Epicritic and protective threshold grossly intact bilaterally.   Musculoskeletal Exam: Range of motion within normal limits to all pedal and ankle joints bilateral. Muscle strength 5/5 in all groups bilateral.   Radiographic Exam:  Normal osseous mineralization. Joint spaces preserved. No fracture/dislocation/boney destruction.    Assessment: #1 heloma molle right foot  Plan of Care:  #1 Patient was evaluated. #2 excisional debridement of the macerated callus tissue was performed and a tissue nipper to the interdigital area between digits 4 and 5 of the right foot. No bleeding noted. #3 recommend Castellani paint or iodine daily #4 return to clinic when necessary   Edrick Kins, DPM [facility]  Dr. Edrick Kins, [address]                                        [place], [place] [address]                Office [phone]  Fax [phone]

## 2016-11-05 ENCOUNTER — Ambulatory Visit (INDEPENDENT_AMBULATORY_CARE_PROVIDER_SITE_OTHER): Payer: Medicare Other | Admitting: Podiatry

## 2016-11-05 DIAGNOSIS — L84 Corns and callosities: Secondary | ICD-10-CM

## 2016-11-05 NOTE — Progress Notes (Signed)
Complaint:  Visit Type: Patient returns to my office for continued preventative foot care services. Complaint: Patient states she has painful callus between the fourth and fifth toes right foot.  She was treated initially with debridement by Dr. Amalia Hailey. Patient has been diagnosed with DM with no foot complications. The patient presents for preventative foot care services.   Podiatric Exam: Vascular: dorsalis pedis and posterior tibial pulses are palpable bilateral. Capillary return is immediate. Temperature gradient is WNL. Skin turgor WNL  Sensorium: Normal Semmes Weinstein monofilament test. Normal tactile sensation bilaterally. Nail Exam: Pt has thick disfigured discolored nails with subungual debris noted bilateral entire nail hallux through fifth toenails Ulcer Exam: There is no evidence of ulcer or pre-ulcerative changes or infection. Orthopedic Exam: Muscle tone and strength are WNL. No limitations in general ROM. No crepitus or effusions noted. Foot type and digits show no abnormalities. Bony prominences are present fifth digit right foot. Skin: No Porokeratosis. No infection or ulcers.  Heloma molle 4/5 toes right foot.  No infection noted.  Diagnosis:  Corn 4/5 toe right foot.  Treatment & Plan Procedures and Treatment: Consent by patient was obtained for treatment procedures. The patient understood the discussion of treatment and procedures well. All questions were answered thoroughly reviewed. Debridement of heloma molle right foot .  Cauterized skin fifth toe right foot. Return Visit-Office Procedure: Patient instructed to return to the office for a follow up visit 3 months for continued evaluation and treatment.    Gardiner Barefoot DPM

## 2016-11-12 ENCOUNTER — Encounter: Payer: Self-pay | Admitting: *Deleted

## 2016-11-13 ENCOUNTER — Encounter: Payer: Self-pay | Admitting: *Deleted

## 2016-11-13 ENCOUNTER — Ambulatory Visit: Payer: Medicare Other | Admitting: Anesthesiology

## 2016-11-13 ENCOUNTER — Ambulatory Visit
Admission: RE | Admit: 2016-11-13 | Discharge: 2016-11-13 | Disposition: A | Discharge status: Home / Self Care | Payer: Medicare Other | Source: Ambulatory Visit | Attending: Gastroenterology | Admitting: Gastroenterology

## 2016-11-13 ENCOUNTER — Encounter
Admission: RE | Disposition: A | Discharge status: Home / Self Care | Payer: Self-pay | Source: Ambulatory Visit | Attending: Gastroenterology

## 2016-11-13 DIAGNOSIS — E039 Hypothyroidism, unspecified: Secondary | ICD-10-CM | POA: Insufficient documentation

## 2016-11-13 DIAGNOSIS — K222 Esophageal obstruction: Secondary | ICD-10-CM | POA: Diagnosis not present

## 2016-11-13 DIAGNOSIS — K2950 Unspecified chronic gastritis without bleeding: Secondary | ICD-10-CM | POA: Insufficient documentation

## 2016-11-13 DIAGNOSIS — Z7982 Long term (current) use of aspirin: Secondary | ICD-10-CM | POA: Insufficient documentation

## 2016-11-13 DIAGNOSIS — D123 Benign neoplasm of transverse colon: Secondary | ICD-10-CM | POA: Insufficient documentation

## 2016-11-13 DIAGNOSIS — D128 Benign neoplasm of rectum: Secondary | ICD-10-CM | POA: Diagnosis not present

## 2016-11-13 DIAGNOSIS — E119 Type 2 diabetes mellitus without complications: Secondary | ICD-10-CM | POA: Diagnosis not present

## 2016-11-13 DIAGNOSIS — K2980 Duodenitis without bleeding: Secondary | ICD-10-CM | POA: Insufficient documentation

## 2016-11-13 DIAGNOSIS — Z8711 Personal history of peptic ulcer disease: Secondary | ICD-10-CM | POA: Diagnosis not present

## 2016-11-13 DIAGNOSIS — Z791 Long term (current) use of non-steroidal anti-inflammatories (NSAID): Secondary | ICD-10-CM | POA: Diagnosis not present

## 2016-11-13 DIAGNOSIS — Z7983 Long term (current) use of bisphosphonates: Secondary | ICD-10-CM | POA: Diagnosis not present

## 2016-11-13 DIAGNOSIS — Z87891 Personal history of nicotine dependence: Secondary | ICD-10-CM | POA: Insufficient documentation

## 2016-11-13 DIAGNOSIS — K319 Disease of stomach and duodenum, unspecified: Secondary | ICD-10-CM | POA: Diagnosis not present

## 2016-11-13 DIAGNOSIS — D509 Iron deficiency anemia, unspecified: Secondary | ICD-10-CM | POA: Diagnosis not present

## 2016-11-13 DIAGNOSIS — K5730 Diverticulosis of large intestine without perforation or abscess without bleeding: Secondary | ICD-10-CM | POA: Diagnosis not present

## 2016-11-13 DIAGNOSIS — Z7984 Long term (current) use of oral hypoglycemic drugs: Secondary | ICD-10-CM | POA: Diagnosis not present

## 2016-11-13 DIAGNOSIS — K2210 Ulcer of esophagus without bleeding: Secondary | ICD-10-CM | POA: Insufficient documentation

## 2016-11-13 DIAGNOSIS — Z79899 Other long term (current) drug therapy: Secondary | ICD-10-CM | POA: Diagnosis not present

## 2016-11-13 DIAGNOSIS — K635 Polyp of colon: Secondary | ICD-10-CM | POA: Insufficient documentation

## 2016-11-13 DIAGNOSIS — Q394 Esophageal web: Secondary | ICD-10-CM | POA: Diagnosis not present

## 2016-11-13 HISTORY — PX: COLONOSCOPY WITH PROPOFOL: SHX5780

## 2016-11-13 HISTORY — PX: ESOPHAGOGASTRODUODENOSCOPY (EGD) WITH PROPOFOL: SHX5813

## 2016-11-13 HISTORY — DX: Gastric ulcer, unspecified as acute or chronic, without hemorrhage or perforation: K25.9

## 2016-11-13 HISTORY — DX: Hypothyroidism, unspecified: E03.9

## 2016-11-13 HISTORY — DX: Age-related osteoporosis without current pathological fracture: M81.0

## 2016-11-13 HISTORY — DX: Anemia, unspecified: D64.9

## 2016-11-13 LAB — GLUCOSE, CAPILLARY
GLUCOSE-CAPILLARY: 58 mg/dL — AB (ref 65–99)
Glucose-Capillary: 97 mg/dL (ref 65–99)

## 2016-11-13 SURGERY — ESOPHAGOGASTRODUODENOSCOPY (EGD) WITH PROPOFOL
Anesthesia: General

## 2016-11-13 MED ORDER — FENTANYL CITRATE (PF) 100 MCG/2ML IJ SOLN
INTRAMUSCULAR | Status: AC
  Filled 2016-11-13: qty 2

## 2016-11-13 MED ORDER — DEXTROSE-NACL 5-0.2 % IV SOLN
INTRAVENOUS | Status: DC | PRN
  Administered 2016-11-13: 09:00:00 via INTRAVENOUS

## 2016-11-13 MED ORDER — PROPOFOL 10 MG/ML IV BOLUS
INTRAVENOUS | Status: AC
  Filled 2016-11-13: qty 20

## 2016-11-13 MED ORDER — PROPOFOL 10 MG/ML IV BOLUS
INTRAVENOUS | Status: DC | PRN
Start: 2016-11-13 — End: 2016-11-13
  Administered 2016-11-13: 100 mg via INTRAVENOUS

## 2016-11-13 MED ORDER — FENTANYL CITRATE (PF) 100 MCG/2ML IJ SOLN
INTRAMUSCULAR | Status: DC | PRN
  Administered 2016-11-13: 50 ug via INTRAVENOUS

## 2016-11-13 MED ORDER — SODIUM CHLORIDE 0.9 % IV SOLN
INTRAVENOUS | Status: DC
  Administered 2016-11-13: 1000 mL via INTRAVENOUS

## 2016-11-13 MED ORDER — PROPOFOL 500 MG/50ML IV EMUL
INTRAVENOUS | Status: DC | PRN
  Administered 2016-11-13: 140 ug/kg/min via INTRAVENOUS

## 2016-11-13 NOTE — H&P (Signed)
Outpatient short stay form Pre-procedure 11/13/2016 8:23 AM Lollie Sails MD  Primary Physician: Dr. Lisette Grinder  Reason for visit:  Iron deficiency anemia  History of present illness:  Patient is a 83 year old female presenting today as above. Patient has got a history of iron deficiency anemia. In reviewing her medications with her of her records states she is taking 81 mg aspirin daily however she states instead she's been taking a regular aspirin, 325 daily. She denies use of any other NSAID-type  or blood thinner. She last took her aspirin yesterday. I did explain to her that this should've been stopped however that it does increase the risk of bleeding.    Current Facility-Administered Medications:  .  0.9 %  sodium chloride infusion, , Intravenous, Continuous, Lollie Sails, MD  Prescriptions Prior to Admission  Medication Sig Dispense Refill Last Dose  . alendronate (FOSAMAX) 70 MG tablet Take 70 mg by mouth once a week. Take with a full glass of water on an empty stomach.     . meloxicam (MOBIC) 7.5 MG tablet Take 7.5 mg by mouth daily.     Marland Kitchen aspirin EC 81 MG tablet Take 81 mg by mouth daily.    Taking  . clotrimazole-betamethasone (LOTRISONE) cream Apply topically.   Taking  . ferrous sulfate 325 (65 FE) MG tablet Take by mouth.   Not Taking  . gabapentin (NEURONTIN) 300 MG capsule Take 300 mg by mouth at bedtime.    Taking  . glipiZIDE (GLUCOTROL) 5 MG tablet Take by mouth.   Taking  . levothyroxine (SYNTHROID, LEVOTHROID) 100 MCG tablet Take 100 mcg by mouth daily before breakfast.    Taking  . magnesium oxide (MAG-OX) 400 MG tablet Take 400 mg by mouth daily.    Taking  . metFORMIN (GLUCOPHAGE) 1000 MG tablet Take 1,000 mg by mouth 2 (two) times daily with a meal.   Taking  . simvastatin (ZOCOR) 40 MG tablet Take 40 mg by mouth at bedtime.    Taking     Allergies  Allergen Reactions  . Sulfa Antibiotics Other (See Comments)    INCREASES HER BLOOD SUGAR , PT  STATED  INCREASES HER BLOOD SUGAR , PT STATED      Past Medical History:  Diagnosis Date  . Anemia   . B12 deficiency 08/03/2016  . Diabetes mellitus without complication ([place])   . Gastric ulcer   . Hypothyroidism   . Onychomycosis 02/04/2013  . Osteoporosis   . Peptic ulcer     Review of systems:      Physical Exam    Heart and lungs: Regular rate and rhythm without rub or gallop, lungs are bilaterally clear.    HEENT: Normocephalic atraumatic eyes are anicteric    Other:     Pertinant exam for procedure: Soft nontender nondistended bowel sounds positive normoactive.    Planned proceedures: EGD, colonoscopy and indicated procedures. I have discussed the risks benefits and complications of procedures to include not limited to bleeding, infection, perforation and the risk of sedation and the patient wishes to proceed.    Lollie Sails, MD Gastroenterology 11/13/2016  8:23 AM

## 2016-11-13 NOTE — Transfer of Care (Signed)
Immediate Anesthesia Transfer of Care Note  Patient: Janet Gross  Procedure(s) Performed: Procedure(s): ESOPHAGOGASTRODUODENOSCOPY (EGD) WITH PROPOFOL (N/A) COLONOSCOPY WITH PROPOFOL (N/A)  Patient Location: PACU  Anesthesia Type:General  Level of Consciousness: sedated  Airway & Oxygen Therapy: Patient Spontanous Breathing and Patient connected to nasal cannula oxygen  Post-op Assessment: Report given to RN and Post -op Vital signs reviewed and stable  Post vital signs: Reviewed and stable  Last Vitals:  Vitals:   11/13/16 0825  BP: 136/64  Pulse: 69  Resp: 16  Temp: 36.1 C    Last Pain:  Vitals:   11/13/16 0825  TempSrc: Tympanic         Complications: No apparent anesthesia complications

## 2016-11-13 NOTE — Anesthesia Preprocedure Evaluation (Signed)
Anesthesia Evaluation  Patient identified by MRN, date of birth, ID band Patient awake    Reviewed: Allergy & Precautions, H&P , NPO status , Patient's Chart, lab work & pertinent test results  History of Anesthesia Complications Negative for: history of anesthetic complications  Airway Mallampati: III  TM Distance: >3 FB Neck ROM: limited    Dental  (+) Poor Dentition, Missing, Upper Dentures, Lower Dentures   Pulmonary neg shortness of breath, former smoker,    Pulmonary exam normal breath sounds clear to auscultation       Cardiovascular Exercise Tolerance: Good (-) angina(-) Past MI and (-) DOE negative cardio ROS Normal cardiovascular exam Rhythm:regular Rate:Normal     Neuro/Psych negative neurological ROS  negative psych ROS   GI/Hepatic Neg liver ROS, PUD, GERD  Controlled,  Endo/Other  diabetes, Type 2Hypothyroidism   Renal/GU negative Renal ROS  negative genitourinary   Musculoskeletal   Abdominal   Peds  Hematology negative hematology ROS (+)   Anesthesia Other Findings Past Medical History: No date: Anemia 08/03/2016: B12 deficiency No date: Diabetes mellitus without complication (HCC) No date: Gastric ulcer No date: Hypothyroidism 02/04/2013: Onychomycosis No date: Osteoporosis No date: Peptic ulcer  Past Surgical History: No date: cataract left No date: COLONOSCOPY No date: ESOPHAGOGASTRODUODENOSCOPY No date: EXCISION VAGINAL CYST No date: neck artery     Comment: carotid endarterectomy     Reproductive/Obstetrics negative OB ROS                             Anesthesia Physical Anesthesia Plan  ASA: III  Anesthesia Plan: General   Post-op Pain Management:    Induction:   Airway Management Planned:   Additional Equipment:   Intra-op Plan:   Post-operative Plan:   Informed Consent: I have reviewed the patients History and Physical, chart, labs and  discussed the procedure including the risks, benefits and alternatives for the proposed anesthesia with the patient or authorized representative who has indicated his/her understanding and acceptance.   Dental Advisory Given  Plan Discussed with: Anesthesiologist, CRNA and Surgeon  Anesthesia Plan Comments:         Anesthesia Quick Evaluation

## 2016-11-13 NOTE — Anesthesia Post-op Follow-up Note (Cosign Needed)
Anesthesia QCDR form completed.        

## 2016-11-13 NOTE — Anesthesia Postprocedure Evaluation (Signed)
Anesthesia Post Note  Patient: Janet Gross  Procedure(s) Performed: Procedure(s) (LRB): ESOPHAGOGASTRODUODENOSCOPY (EGD) WITH PROPOFOL (N/A) COLONOSCOPY WITH PROPOFOL (N/A)  Patient location during evaluation: Endoscopy Anesthesia Type: General Level of consciousness: awake and alert Pain management: pain level controlled Vital Signs Assessment: post-procedure vital signs reviewed and stable Respiratory status: spontaneous breathing, nonlabored ventilation, respiratory function stable and patient connected to nasal cannula oxygen Cardiovascular status: blood pressure returned to baseline and stable Postop Assessment: no signs of nausea or vomiting Anesthetic complications: no     Last Vitals:  Vitals:   11/13/16 1020 11/13/16 1030  BP: (!) 142/68 129/76  Pulse: (!) 41 (!) 47  Resp: 14 18  Temp:      Last Pain:  Vitals:   11/13/16 1001  TempSrc: Tympanic                 Precious Haws Shatasha Lambing

## 2016-11-13 NOTE — Op Note (Signed)
[facility] Gastroenterology Patient Name: Janet Gross Procedure Date: 11/13/2016 8:20 AM MRN: [id number] Account #: [id number] Date of Birth: Sep 15, 1930 Admit Type: Outpatient Age: 83 Room: [facility] ENDO ROOM 3 Gender: Female Note Status: Finalized Procedure:            Upper GI endoscopy Indications:          Iron deficiency anemia Providers:            Lollie Sails, MD Referring MD:         Hewitt Blade. Sarina Ser, MD (Referring MD) Medicines:            Monitored Anesthesia Care Complications:        No immediate complications. Procedure:            Pre-Anesthesia Assessment:                       - ASA Grade Assessment: III - A patient with severe                        systemic disease.                       After obtaining informed consent, the endoscope was                        passed under direct vision. Throughout the procedure,                        the patient's blood pressure, pulse, and oxygen                        saturations were monitored continuously. The Endoscope                        was introduced through the mouth, and advanced to the                        third part of duodenum. The upper GI endoscopy was                        accomplished without difficulty. The patient tolerated                        the procedure well. Findings:      A thin web was found at/above the cricopharyngeus. This was dilated with       passage of the scope.      A low-grade of narrowing Schatzki ring (acquired) was found at the       gastroesophageal junction. This was also found to be opened with passage       of the scope      LA Grade B (one or more mucosal breaks greater than 5 mm, not extending       between the tops of two mucosal folds) esophagitis with no bleeding was       found.      Patchy moderate inflammation characterized by congestion (edema),       erosions and erythema was found in the gastric antrum. Biopsies were       taken with  a cold forceps for histology. Biopsies were taken with a cold  forceps for Helicobacter pylori testing.      The cardia and gastric fundus were normal on retroflexion.      Patchy mild inflammation characterized by congestion (edema) and       erythema was found in the first portion of the duodenum and in the       second portion of the duodenum. 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                       - Low-grade of narrowing Schatzki ring.                       - LA Grade B erosive esophagitis.                       - No specimens collected. Recommendation:       - Use Protonix (pantoprazole) 40 mg PO daily daily.                       - consider decrease of aspirin dose to 81 mg daily                       - Return to GI clinic in 4 weeks. Procedure Code(s):    --- Professional ---                       [phone], Esophagogastroduodenoscopy, flexible, transoral;                        with biopsy, single or multiple Diagnosis Code(s):    --- Professional ---                       Q39.4, Esophageal web                       K22.2, Esophageal obstruction                       K20.8, Other esophagitis                       D50.9, Iron deficiency anemia, unspecified CPT copyright 2016 American Medical Association. All rights reserved. The codes documented in this report are preliminary and upon coder review may  be revised to meet current compliance requirements. Lollie Sails, MD 11/13/2016 9:25:39 AM This report has been signed electronically. Number of Addenda: 0 Note Initiated On: 11/13/2016 8:20 AM      [facility]

## 2016-11-13 NOTE — Op Note (Signed)
[facility] Gastroenterology Patient Name: Janet Gross Procedure Date: 11/13/2016 8:18 AM MRN: [id number] Account #: [id number] Date of Birth: July 25, 1931 Admit Type: Outpatient Age: 83 Room: [facility] ENDO ROOM 3 Gender: Female Note Status: Finalized Procedure:            Colonoscopy Indications:          Iron deficiency anemia Providers:            Lollie Sails, MD Medicines:            Monitored Anesthesia Care Complications:        No immediate complications. Procedure:            Pre-Anesthesia Assessment:                       - ASA Grade Assessment: III - A patient with severe                        systemic disease.                       After obtaining informed consent, the colonoscope was                        passed under direct vision. Throughout the procedure,                        the patient's blood pressure, pulse, and oxygen                        saturations were monitored continuously. The                        Colonoscope was introduced through the anus and                        advanced to the the cecum, identified by appendiceal                        orifice and ileocecal valve. The colonoscopy was                        performed without difficulty. The patient tolerated the                        procedure well. Findings:      A 2 mm polyp was found in the hepatic flexure. The polyp was sessile.       The polyp was removed with a cold biopsy forceps. Resection and       retrieval were complete.      A 2 mm polyp was found in the cecum. The polyp was sessile. The polyp       was removed with a cold biopsy forceps. Resection and retrieval were       complete.      A 5 mm polyp was found in the rectum. The polyp was semi-pedunculated.       The polyp was removed with a cold biopsy forceps. The polyp was removed       with a lift and cut technique using a cold snare. Resection and       retrieval were complete.      A  few  medium-mouthed diverticula were found in the sigmoid colon,       descending colon and transverse colon.      The digital rectal exam was normal.      The retroflexed view of the distal rectum and anal verge was normal and       showed no anal or rectal abnormalities. Impression:           - One 2 mm polyp at the hepatic flexure, removed with a                        cold biopsy forceps. Resected and retrieved.                       - One 2 mm polyp in the cecum, removed with a cold                        biopsy forceps. Resected and retrieved.                       - One 5 mm polyp in the rectum, removed using lift and                        cut and a cold snare and removed with a cold biopsy                        forceps. Resected and retrieved.                       - Diverticulosis in the sigmoid colon, in the                        descending colon and in the transverse colon.                       - The distal rectum and anal verge are normal on                        retroflexion view. Recommendation:       - Discharge patient to home.                       - Discharge patient to home.                       - Await pathology results.                       - Return to GI office in 1 month. Procedure Code(s):    --- Professional ---                       [phone], Colonoscopy, flexible; with removal of tumor(s),                        polyp(s), or other lesion(s) by snare technique                       [id number], 59, Colonoscopy, flexible; with biopsy, single  or multiple Diagnosis Code(s):    --- Professional ---                       D12.3, Benign neoplasm of transverse colon (hepatic                        flexure or splenic flexure)                       D12.0, Benign neoplasm of cecum                       K62.1, Rectal polyp                       D50.9, Iron deficiency anemia, unspecified                       K57.30, Diverticulosis of large intestine  without                        perforation or abscess without bleeding CPT copyright 2016 American Medical Association. All rights reserved. The codes documented in this report are preliminary and upon coder review may  be revised to meet current compliance requirements. Lollie Sails, MD 11/13/2016 9:59:41 AM This report has been signed electronically. Number of Addenda: 0 Note Initiated On: 11/13/2016 8:18 AM Scope Withdrawal Time: 0 hours 14 minutes 49 seconds  Total Procedure Duration: 0 hours 26 minutes 54 seconds       [employer]

## 2016-11-14 NOTE — OR Nursing (Signed)
PT has been bleeding all night.DR skulskie Notified.PT to come to office today.

## 2016-11-15 LAB — SURGICAL PATHOLOGY

## 2016-11-26 ENCOUNTER — Ambulatory Visit: Payer: Medicare Other | Admitting: Podiatry

## 2016-11-27 ENCOUNTER — Ambulatory Visit (INDEPENDENT_AMBULATORY_CARE_PROVIDER_SITE_OTHER): Payer: Medicare Other | Admitting: Podiatry

## 2016-11-27 DIAGNOSIS — L851 Acquired keratosis [keratoderma] palmaris et plantaris: Secondary | ICD-10-CM

## 2016-11-27 DIAGNOSIS — M79672 Pain in left foot: Secondary | ICD-10-CM | POA: Diagnosis not present

## 2016-11-27 DIAGNOSIS — L84 Corns and callosities: Secondary | ICD-10-CM | POA: Diagnosis not present

## 2016-11-27 DIAGNOSIS — M79671 Pain in right foot: Secondary | ICD-10-CM

## 2016-11-28 NOTE — Progress Notes (Signed)
   Subjective: Patient presents to the office today for chief complaint of a very sore callus between the 4th and 5th digits of the right foot that has been present for several weeks. Patient states that the pain is affecting her ability to ambulate and wear shoes without pain. Patient presents today for further treatment and evaluation.  Objective:  Physical Exam General: Alert and oriented x3 in no acute distress  Dermatology: Heloma molle present on the fourth interdigital space of the right foot. Pain on palpation to the area. Skin is warm, dry and supple bilateral lower extremities. Negative for open lesions or macerations.  Vascular: Palpable pedal pulses bilaterally. No edema or erythema noted. Capillary refill within normal limits.  Neurological: Epicritic and protective threshold grossly intact bilaterally.   Musculoskeletal Exam: Pain on palpation at the [facility] noted. Range of motion within normal limits bilateral. Muscle strength 5/5 in all groups bilateral.  Assessment: #1 Heloma Molle fourth interdigital space-right   Plan of Care:  #1 Patient evaluated #2 Excisional debridement of heloma molle using a tissue nipper was performed without incident.  #3 Patient is to return to the clinic in 3 months to see Dr. Prudence Davidson.Edrick Kins, DPM [facility]  Dr. Edrick Kins, [address]                                        [place], [place] [address]                Office [phone]  Fax [phone]

## 2016-12-03 ENCOUNTER — Ambulatory Visit: Payer: Medicare Other | Admitting: Podiatry

## 2017-01-29 ENCOUNTER — Inpatient Hospital Stay: Payer: Medicare Other

## 2017-01-29 ENCOUNTER — Inpatient Hospital Stay: Payer: Medicare Other | Admitting: Oncology

## 2017-02-05 ENCOUNTER — Encounter: Payer: Self-pay | Admitting: Oncology

## 2017-02-05 ENCOUNTER — Inpatient Hospital Stay: Payer: Medicare Other | Attending: Oncology

## 2017-02-05 ENCOUNTER — Other Ambulatory Visit: Payer: Self-pay | Admitting: *Deleted

## 2017-02-05 ENCOUNTER — Inpatient Hospital Stay (HOSPITAL_BASED_OUTPATIENT_CLINIC_OR_DEPARTMENT_OTHER): Payer: Medicare Other | Admitting: Oncology

## 2017-02-05 VITALS — BP 168/69 | HR 59 | Temp 97.6°F | Resp 20 | Wt 139.1 lb

## 2017-02-05 DIAGNOSIS — Z8711 Personal history of peptic ulcer disease: Secondary | ICD-10-CM | POA: Diagnosis not present

## 2017-02-05 DIAGNOSIS — Z7982 Long term (current) use of aspirin: Secondary | ICD-10-CM | POA: Diagnosis not present

## 2017-02-05 DIAGNOSIS — Z87891 Personal history of nicotine dependence: Secondary | ICD-10-CM

## 2017-02-05 DIAGNOSIS — D72819 Decreased white blood cell count, unspecified: Secondary | ICD-10-CM | POA: Diagnosis not present

## 2017-02-05 DIAGNOSIS — E039 Hypothyroidism, unspecified: Secondary | ICD-10-CM

## 2017-02-05 DIAGNOSIS — E538 Deficiency of other specified B group vitamins: Secondary | ICD-10-CM | POA: Diagnosis not present

## 2017-02-05 DIAGNOSIS — E119 Type 2 diabetes mellitus without complications: Secondary | ICD-10-CM | POA: Insufficient documentation

## 2017-02-05 DIAGNOSIS — D509 Iron deficiency anemia, unspecified: Secondary | ICD-10-CM

## 2017-02-05 DIAGNOSIS — Z7984 Long term (current) use of oral hypoglycemic drugs: Secondary | ICD-10-CM

## 2017-02-05 DIAGNOSIS — Z79899 Other long term (current) drug therapy: Secondary | ICD-10-CM | POA: Diagnosis not present

## 2017-02-05 DIAGNOSIS — B351 Tinea unguium: Secondary | ICD-10-CM

## 2017-02-05 LAB — IRON AND TIBC
Iron: 56 ug/dL (ref 28–170)
SATURATION RATIOS: 17 % (ref 10.4–31.8)
TIBC: 328 ug/dL (ref 250–450)
UIBC: 272 ug/dL

## 2017-02-05 LAB — CBC
HCT: 37.3 % (ref 35.0–47.0)
Hemoglobin: 12.2 g/dL (ref 12.0–16.0)
MCH: 26.1 pg (ref 26.0–34.0)
MCHC: 32.6 g/dL (ref 32.0–36.0)
MCV: 80.2 fL (ref 80.0–100.0)
PLATELETS: 210 10*3/uL (ref 150–440)
RBC: 4.65 MIL/uL (ref 3.80–5.20)
RDW: 15.1 % — ABNORMAL HIGH (ref 11.5–14.5)
WBC: 3.1 10*3/uL — ABNORMAL LOW (ref 3.6–11.0)

## 2017-02-05 LAB — VITAMIN B12: Vitamin B-12: 163 pg/mL — ABNORMAL LOW (ref 180–914)

## 2017-02-05 LAB — FERRITIN: FERRITIN: 21 ng/mL (ref 11–307)

## 2017-02-05 NOTE — Progress Notes (Signed)
Patient offers no complaints today. 

## 2017-02-05 NOTE — Progress Notes (Signed)
Hematology/Oncology Consult note [facility]  Telephone:([phone] [phone]  Patient Care Team: Madelyn Brunner, MD as PCP - General (Internal Medicine)   Name of the patient: Janet Gross  [id number]  1931-05-13   Date of visit: 02/05/17  Chief complaint/ Reason for visit- routine follow-up of iron deficiency anemia  Heme/Onc history: Patient was initially seen by Dr. Sherrine Maples for evaluation of iron deficiency anemia on 06/27/2016. At that time she was referred from her PCP for hemoglobin of 6.3 and iron saturation of 14%. CT chest from 05/26/2013 showed a large cyst involving the midportion of the left kidney measuring up to 4.9 x 5.6 cm. Last known normal hemoglobin was 11 on 05/26/2015. She was given 2 doses of ferrahemegiven that she was significantly anemic although minimally symptomatic.Marland Kitchen She has been seen by [facility] gastroenterology in the past. Patient apparently had a colonoscopy in 2006 which showed a tubular adenoma.Also had a repeat colonoscopy on 06/27/2012 which again showed tubular adenoma and a repeat colonoscopy in 3 years was recommended. She has also had EGD in 2006 and 2013. History of peptic ulcer many years ago. Labs on 06/27/2016 showed ferritin of 3, low iron of 9, elevated TIBC of 476. H&H were 6/19.8 with an MCV of 62.9. B12 levels were low normal at 182  Last dose of Feraheme was on 08/03/2016.  Interval history- says that she is very happy with her life. She can run, cook and pretty much do everything without any issues  ECOG PS- 0 Pain scale- 0   Review of systems- Review of Systems  Constitutional: Negative for chills, fever, malaise/fatigue and weight loss.  HENT: Negative for congestion, ear discharge and nosebleeds.   Eyes: Negative for blurred vision.  Respiratory: Negative for cough, hemoptysis, sputum production, shortness of breath and wheezing.   Cardiovascular: Negative for chest pain, palpitations,  orthopnea and claudication.  Gastrointestinal: Negative for abdominal pain, blood in stool, constipation, diarrhea, heartburn, melena, nausea and vomiting.  Genitourinary: Negative for dysuria, flank pain, frequency, hematuria and urgency.  Musculoskeletal: Negative for back pain, joint pain and myalgias.  Skin: Negative for rash.  Neurological: Negative for dizziness, tingling, focal weakness, seizures, weakness and headaches.  Endo/Heme/Allergies: Does not bruise/bleed easily.  Psychiatric/Behavioral: Negative for depression and suicidal ideas. The patient does not have insomnia.       Allergies  Allergen Reactions  . Sulfa Antibiotics Other (See Comments)    INCREASES HER BLOOD SUGAR , PT STATED  INCREASES HER BLOOD SUGAR , PT STATED      Past Medical History:  Diagnosis Date  . Anemia   . B12 deficiency 08/03/2016  . Diabetes mellitus without complication ([place])   . Gastric ulcer   . Hypothyroidism   . Onychomycosis 02/04/2013  . Osteoporosis   . Peptic ulcer      Past Surgical History:  Procedure Laterality Date  . cataract left    . COLONOSCOPY    . COLONOSCOPY WITH PROPOFOL N/A 11/13/2016   Procedure: COLONOSCOPY WITH PROPOFOL;  Surgeon: Lollie Sails, MD;  Location: [facility] ENDOSCOPY;  Service: Endoscopy;  Laterality: N/A;  . ESOPHAGOGASTRODUODENOSCOPY    . ESOPHAGOGASTRODUODENOSCOPY (EGD) WITH PROPOFOL N/A 11/13/2016   Procedure: ESOPHAGOGASTRODUODENOSCOPY (EGD) WITH PROPOFOL;  Surgeon: Lollie Sails, MD;  Location: [facility] ENDOSCOPY;  Service: Endoscopy;  Laterality: N/A;  . EXCISION VAGINAL CYST    . neck artery     carotid endarterectomy    Social History   Social History  .  Marital status: Widowed    Spouse name: N/A  . Number of children: N/A  . Years of education: N/A   Occupational History  . Not on file.   Social History Main Topics  . Smoking status: Former Smoker    Packs/day: 0.25    Years: 10.00    Types: Cigarettes    Quit date:  08/07/1963  . Smokeless tobacco: Never Used     Comment: quit over 40 years ago  . Alcohol use No  . Drug use: No  . Sexual activity: Not on file   Other Topics Concern  . Not on file   Social History Narrative  . No narrative on file    No family history on file.   Current Outpatient Prescriptions:  .  alendronate (FOSAMAX) 70 MG tablet, Take 70 mg by mouth once a week. Take with a full glass of water on an empty stomach., Disp: , Rfl:  .  ferrous sulfate 325 (65 FE) MG tablet, Take by mouth., Disp: , Rfl:  .  gabapentin (NEURONTIN) 300 MG capsule, Take 300 mg by mouth at bedtime. , Disp: , Rfl:  .  glipiZIDE (GLUCOTROL) 5 MG tablet, Take by mouth., Disp: , Rfl:  .  levothyroxine (SYNTHROID, LEVOTHROID) 100 MCG tablet, Take 100 mcg by mouth daily before breakfast. , Disp: , Rfl:  .  magnesium oxide (MAG-OX) 400 MG tablet, Take 400 mg by mouth daily. , Disp: , Rfl:  .  metFORMIN (GLUCOPHAGE) 1000 MG tablet, Take 1,000 mg by mouth 2 (two) times daily with a meal., Disp: , Rfl:  .  aspirin EC 81 MG tablet, Take 81 mg by mouth daily. , Disp: , Rfl:  .  clotrimazole-betamethasone (LOTRISONE) cream, Apply topically., Disp: , Rfl:  .  meloxicam (MOBIC) 7.5 MG tablet, Take 7.5 mg by mouth daily., Disp: , Rfl:  .  simvastatin (ZOCOR) 40 MG tablet, Take 40 mg by mouth at bedtime. , Disp: , Rfl:   Physical exam:  Vitals:   02/05/17 1353  BP: (!) 168/69  Pulse: (!) 59  Resp: 20  Temp: 97.6 F (36.4 C)  Weight: 139 lb 2 oz (63.1 kg)   Physical Exam  Constitutional: She is oriented to person, place, and time and well-developed, well-nourished, and in no distress.  HENT:  Head: Normocephalic and atraumatic.  Eyes: EOM are normal. Pupils are equal, round, and reactive to light.  Neck: Normal range of motion.  Cardiovascular: Normal rate, regular rhythm and normal heart sounds.   Pulmonary/Chest: Effort normal and breath sounds normal.  Abdominal: Soft. Bowel sounds are normal.    Neurological: She is alert and oriented to person, place, and time.  Skin: Skin is warm and dry.     CMP Latest Ref Rng & Units 07/14/2015  Glucose 65 - 99 mg/dL 84  BUN 6 - 20 mg/dL 11  Creatinine 0.44 - 1.00 mg/dL 0.60  Sodium 135 - 145 mmol/L 140  Potassium 3.5 - 5.1 mmol/L 3.8  Chloride 101 - 111 mmol/L 109  CO2 22 - 32 mmol/L 27  Calcium 8.9 - 10.3 mg/dL 8.7(L)   CBC Latest Ref Rng & Units 02/05/2017  WBC 3.6 - 11.0 K/uL 3.1(L)  Hemoglobin 12.0 - 16.0 g/dL 12.2  Hematocrit 35.0 - 47.0 % 37.3  Platelets 150 - 440 K/uL 210      Assessment and plan- Patient is a 83 y.o. female with iron and B12 deficinecy  1. Iron deficiency anemia- H/H is normal today. Iron  studies pending. No need for IV iron  2. Mild leukopenia- continue to monitor  3. B12 deficiency- levels from today pending. She is not sure if she takes oral B12. Will decide about B12 shots based on levels today'  check cbc, iron studies in 3 and 6 months and I will see her in 6 months   Visit Diagnosis 1. Iron deficiency anemia, unspecified iron deficiency anemia type      Dr. Randa Evens, MD, MPH [place] [facility] Pager- [phone] 02/05/2017 2:04 PM

## 2017-02-12 ENCOUNTER — Inpatient Hospital Stay: Payer: Medicare Other

## 2017-03-04 ENCOUNTER — Ambulatory Visit: Payer: Medicare Other | Admitting: Podiatry

## 2017-03-07 ENCOUNTER — Ambulatory Visit: Payer: Medicare Other | Admitting: Podiatry

## 2017-03-11 ENCOUNTER — Ambulatory Visit: Payer: Medicare Other | Admitting: Podiatry

## 2017-03-12 ENCOUNTER — Inpatient Hospital Stay: Payer: Medicare Other | Attending: Oncology

## 2017-03-25 ENCOUNTER — Ambulatory Visit: Payer: Medicare Other | Admitting: Podiatry

## 2017-04-09 ENCOUNTER — Inpatient Hospital Stay: Payer: Medicare Other | Attending: Oncology

## 2017-04-11 ENCOUNTER — Encounter: Payer: Self-pay | Admitting: Podiatry

## 2017-04-11 ENCOUNTER — Ambulatory Visit (INDEPENDENT_AMBULATORY_CARE_PROVIDER_SITE_OTHER): Payer: Medicare Other | Admitting: Podiatry

## 2017-04-11 DIAGNOSIS — B351 Tinea unguium: Secondary | ICD-10-CM | POA: Diagnosis not present

## 2017-04-11 DIAGNOSIS — M79676 Pain in unspecified toe(s): Secondary | ICD-10-CM

## 2017-04-11 DIAGNOSIS — L03032 Cellulitis of left toe: Secondary | ICD-10-CM

## 2017-04-11 DIAGNOSIS — E119 Type 2 diabetes mellitus without complications: Secondary | ICD-10-CM

## 2017-04-11 NOTE — Progress Notes (Signed)
This patient presents to the office with chief complaint of long thick painful nails.  She says the nails have become painful walking and wearing her shoes.  She was last seen by Dr. Amalia Hailey on 11/27/2016.  She also says cc drainage coming from the big toe of the left foot.  She presents the office today for preventative foot care services.  Patient is diabetic on metformin. Marland Kitchen Heloma molle of the fourth interspace right foot has resolved   GENERAL APPEARANCE: Alert, conversant. Appropriately groomed. No acute distress.  VASCULAR: Pedal pulses are  palpable at  [facility] and PT bilateral.  Capillary refill time is immediate to all digits,  Normal temperature gradient.  Digital hair growth is present bilateral  NEUROLOGIC: sensation is normal to 5.07 monofilament at 5/5 sites bilateral.  Light touch is intact bilateral, Muscle strength normal.  MUSCULOSKELETAL: acceptable muscle strength, tone and stability bilateral.  Intrinsic muscluature intact bilateral.  Rectus appearance of foot and digits noted bilateral.  NAILS  thick disfigured discolored nails with subungual debris noted bilateral.  No evidence of an active infection is noted.  Patient does have an unattached distal plate of the left hallux.  Examination of the tissue under the unattached nail plate reveals a granuloma. DERMATOLOGIC: skin color, texture, and turgor are within normal limits.  No preulcerative lesions or ulcers  are seen, no interdigital maceration noted.  No open lesions present.   No drainage noted.  Paronychia left hallux.  Onychomycosis  B/l  Debridement and grinding of long thick painful nails.  Incision and drainage of paronychia left hallux.  Neosporin and dry sterile dressing was applied.  Return to the clinic in 3 months for continued preventative foot care services.  Patient to call the office and make an appointment if the toe continues to be infected   Gardiner Barefoot DPM

## 2017-05-07 ENCOUNTER — Inpatient Hospital Stay: Payer: Medicare Other

## 2017-05-07 MED ORDER — CYANOCOBALAMIN 1000 MCG/ML IJ SOLN: 1000.0000 ug | Freq: Once | INTRAMUSCULAR | Status: DC

## 2017-05-09 ENCOUNTER — Inpatient Hospital Stay: Payer: Medicare Other | Attending: Oncology

## 2017-05-09 ENCOUNTER — Inpatient Hospital Stay: Payer: Medicare Other

## 2017-06-04 ENCOUNTER — Inpatient Hospital Stay: Payer: Medicare Other

## 2017-07-02 ENCOUNTER — Inpatient Hospital Stay: Payer: Medicare Other | Attending: Oncology

## 2017-07-11 ENCOUNTER — Ambulatory Visit: Payer: Medicare Other | Admitting: Podiatry

## 2017-08-01 ENCOUNTER — Inpatient Hospital Stay: Payer: Medicare Other | Attending: Oncology

## 2017-08-08 ENCOUNTER — Inpatient Hospital Stay: Payer: Medicare Other | Admitting: Oncology

## 2017-08-08 ENCOUNTER — Inpatient Hospital Stay: Payer: Medicare Other

## 2017-08-08 ENCOUNTER — Telehealth: Payer: Self-pay | Admitting: Oncology

## 2017-08-08 NOTE — Telephone Encounter (Signed)
Rschd 08/08/17 No Show Lab/MD/B-12 inj, per Judeen Hammans (verbal). Appts confirmed with Ivin Booty Hall/niece.

## 2017-08-20 ENCOUNTER — Inpatient Hospital Stay: Payer: Medicare Other

## 2017-08-20 ENCOUNTER — Inpatient Hospital Stay (HOSPITAL_BASED_OUTPATIENT_CLINIC_OR_DEPARTMENT_OTHER): Payer: Medicare Other | Admitting: Oncology

## 2017-08-20 ENCOUNTER — Inpatient Hospital Stay: Payer: Medicare Other | Attending: Oncology

## 2017-08-20 ENCOUNTER — Encounter: Payer: Self-pay | Admitting: Oncology

## 2017-08-20 VITALS — BP 143/76 | HR 68 | Temp 97.1°F | Resp 16 | Wt 143.0 lb

## 2017-08-20 DIAGNOSIS — E119 Type 2 diabetes mellitus without complications: Secondary | ICD-10-CM | POA: Diagnosis not present

## 2017-08-20 DIAGNOSIS — Z8711 Personal history of peptic ulcer disease: Secondary | ICD-10-CM | POA: Insufficient documentation

## 2017-08-20 DIAGNOSIS — E039 Hypothyroidism, unspecified: Secondary | ICD-10-CM | POA: Diagnosis not present

## 2017-08-20 DIAGNOSIS — R7989 Other specified abnormal findings of blood chemistry: Secondary | ICD-10-CM

## 2017-08-20 DIAGNOSIS — D509 Iron deficiency anemia, unspecified: Secondary | ICD-10-CM | POA: Insufficient documentation

## 2017-08-20 DIAGNOSIS — D126 Benign neoplasm of colon, unspecified: Secondary | ICD-10-CM | POA: Diagnosis not present

## 2017-08-20 DIAGNOSIS — Z79899 Other long term (current) drug therapy: Secondary | ICD-10-CM

## 2017-08-20 DIAGNOSIS — Z87891 Personal history of nicotine dependence: Secondary | ICD-10-CM | POA: Insufficient documentation

## 2017-08-20 DIAGNOSIS — D72810 Lymphocytopenia: Secondary | ICD-10-CM | POA: Diagnosis not present

## 2017-08-20 DIAGNOSIS — D72819 Decreased white blood cell count, unspecified: Secondary | ICD-10-CM | POA: Insufficient documentation

## 2017-08-20 DIAGNOSIS — M810 Age-related osteoporosis without current pathological fracture: Secondary | ICD-10-CM

## 2017-08-20 DIAGNOSIS — D519 Vitamin B12 deficiency anemia, unspecified: Secondary | ICD-10-CM | POA: Insufficient documentation

## 2017-08-20 DIAGNOSIS — B351 Tinea unguium: Secondary | ICD-10-CM

## 2017-08-20 LAB — CBC WITH DIFFERENTIAL/PLATELET
BASOS ABS: 0 10*3/uL (ref 0–0.1)
Basophils Relative: 1 %
Eosinophils Absolute: 0 10*3/uL (ref 0–0.7)
Eosinophils Relative: 1 %
HCT: 37.9 % (ref 35.0–47.0)
HEMOGLOBIN: 12.2 g/dL (ref 12.0–16.0)
LYMPHS PCT: 27 %
Lymphs Abs: 0.8 10*3/uL — ABNORMAL LOW (ref 1.0–3.6)
MCH: 25.5 pg — AB (ref 26.0–34.0)
MCHC: 32.3 g/dL (ref 32.0–36.0)
MCV: 79.1 fL — ABNORMAL LOW (ref 80.0–100.0)
Monocytes Absolute: 0.4 10*3/uL (ref 0.2–0.9)
Monocytes Relative: 13 %
NEUTROS PCT: 58 %
Neutro Abs: 1.7 10*3/uL (ref 1.4–6.5)
Platelets: 209 10*3/uL (ref 150–440)
RBC: 4.8 MIL/uL (ref 3.80–5.20)
RDW: 15.9 % — ABNORMAL HIGH (ref 11.5–14.5)
WBC: 2.9 10*3/uL — AB (ref 3.6–11.0)

## 2017-08-20 LAB — IRON AND TIBC
Iron: 32 ug/dL (ref 28–170)
Saturation Ratios: 9 % — ABNORMAL LOW (ref 10.4–31.8)
TIBC: 342 ug/dL (ref 250–450)
UIBC: 310 ug/dL

## 2017-08-20 LAB — FERRITIN: FERRITIN: 23 ng/mL (ref 11–307)

## 2017-08-20 NOTE — Progress Notes (Signed)
Hematology/Oncology Consult note [facility]  Telephone:([phone] [phone]  Patient Care Team: Madelyn Brunner, MD as PCP - General (Internal Medicine)   Name of the patient: Janet Gross  [id number]  05-Apr-1931   Date of visit: 08/20/17  Diagnosis- iron deficiency anemia  Chief complaint/ Reason for visit- f/u of iron deficiency anemia  Heme/Onc history: Patient was initially seen by Dr. Sherrine Maples for evaluation of iron deficiency anemia on 06/27/2016. At that time she was referred from her PCP for hemoglobin of 6.3 and iron saturation of 14%. CT chest from 05/26/2013 showed a large cyst involving the midportion of the left kidney measuring up to 4.9 x 5.6 cm. Last known normal hemoglobin was 11 on 05/26/2015. She was given 2 doses of ferrahemegiven that she was significantly anemic although minimally symptomatic.Marland Kitchen She has been seen by [facility] gastroenterology in the past. Patient apparently had a colonoscopy in 2006 which showed a tubular adenoma.Also had a repeat colonoscopy on 06/27/2012 which again showed tubular adenoma and a repeat colonoscopy in 3 years was recommended. She has also had EGD in 2006 and 2013. History of peptic ulcer many years ago. Labs on 06/27/2016 showed ferritin of 3, low iron of 9, elevated TIBC of 476. H&H were 6/19.8 with an MCV of 62.9. B12 levels were low normal at 182  Last dose of Feraheme was in feb 2018  Colonoscopy April 2018-2 mm polyp hepatic flexure, 2 mm polyps cecum, 5 mm polyp rectum, diverticulosis sigmoid, descending, transverse. Path w/ 2 TAs  EGD 2018-low grade narrowing at GEJ, grade B esophagitis, gastritis, duodenitis. Negative H. Pylori, gastritis.    Interval history- she reports doing well. Denies any blood loss in urine or stool. She did not take po b12 and did not have interim bloodwork in 3 months. Son reports she has on and off memory issues but remains functional and independent of  ADL's and IADL's  ECOG PS- 0 Pain scale- 0 Opioid associated constipation- no  Review of systems- Review of Systems  Constitutional: Negative for chills, fever, malaise/fatigue and weight loss.  HENT: Negative for congestion, ear discharge and nosebleeds.   Eyes: Negative for blurred vision.  Respiratory: Negative for cough, hemoptysis, sputum production, shortness of breath and wheezing.   Cardiovascular: Negative for chest pain, palpitations, orthopnea and claudication.  Gastrointestinal: Negative for abdominal pain, blood in stool, constipation, diarrhea, heartburn, melena, nausea and vomiting.  Genitourinary: Negative for dysuria, flank pain, frequency, hematuria and urgency.  Musculoskeletal: Negative for back pain, joint pain and myalgias.  Skin: Negative for rash.  Neurological: Negative for dizziness, tingling, focal weakness, seizures, weakness and headaches.  Endo/Heme/Allergies: Does not bruise/bleed easily.  Psychiatric/Behavioral: Negative for depression and suicidal ideas. The patient does not have insomnia.      Allergies  Allergen Reactions  . Sulfa Antibiotics Other (See Comments)    INCREASES HER BLOOD SUGAR , PT STATED  INCREASES HER BLOOD SUGAR , PT STATED      Past Medical History:  Diagnosis Date  . Anemia   . B12 deficiency 08/03/2016  . Diabetes mellitus without complication ([place])   . Gastric ulcer   . Hypothyroidism   . Onychomycosis 02/04/2013  . Osteoporosis   . Peptic ulcer      Past Surgical History:  Procedure Laterality Date  . cataract left    . COLONOSCOPY    . COLONOSCOPY WITH PROPOFOL N/A 11/13/2016   Procedure: COLONOSCOPY WITH PROPOFOL;  Surgeon: Lollie Sails, MD;  Location: [facility] ENDOSCOPY;  Service: Endoscopy;  Laterality: N/A;  . ESOPHAGOGASTRODUODENOSCOPY    . ESOPHAGOGASTRODUODENOSCOPY (EGD) WITH PROPOFOL N/A 11/13/2016   Procedure: ESOPHAGOGASTRODUODENOSCOPY (EGD) WITH PROPOFOL;  Surgeon: Lollie Sails, MD;  Location:  [facility] ENDOSCOPY;  Service: Endoscopy;  Laterality: N/A;  . EXCISION VAGINAL CYST    . neck artery     carotid endarterectomy    Social History   Socioeconomic History  . Marital status: Widowed    Spouse name: Not on file  . Number of children: Not on file  . Years of education: Not on file  . Highest education level: Not on file  Social Needs  . Financial resource strain: Not on file  . Food insecurity - worry: Not on file  . Food insecurity - inability: Not on file  . Transportation needs - medical: Not on file  . Transportation needs - non-medical: Not on file  Occupational History  . Not on file  Tobacco Use  . Smoking status: Former Smoker    Packs/day: 0.25    Years: 10.00    Pack years: 2.50    Types: Cigarettes    Last attempt to quit: 08/07/1963    Years since quitting: 54.0  . Smokeless tobacco: Never Used  . Tobacco comment: quit over 40 years ago  Substance and Sexual Activity  . Alcohol use: No  . Drug use: No  . Sexual activity: Not on file  Other Topics Concern  . Not on file  Social History Narrative  . Not on file    No family history on file.   Current Outpatient Medications:  .  alendronate (FOSAMAX) 70 MG tablet, Take 70 mg by mouth once a week. Take with a full glass of water on an empty stomach., Disp: , Rfl:  .  aspirin EC 81 MG tablet, Take 81 mg by mouth daily. , Disp: , Rfl:  .  ferrous sulfate 325 (65 FE) MG tablet, Take by mouth., Disp: , Rfl:  .  gabapentin (NEURONTIN) 300 MG capsule, Take 300 mg by mouth at bedtime. , Disp: , Rfl:  .  glipiZIDE (GLUCOTROL) 5 MG tablet, Take by mouth., Disp: , Rfl:  .  levothyroxine (SYNTHROID, LEVOTHROID) 100 MCG tablet, Take 100 mcg by mouth daily before breakfast. , Disp: , Rfl:  .  magnesium oxide (MAG-OX) 400 MG tablet, Take 400 mg by mouth daily. , Disp: , Rfl:  .  meloxicam (MOBIC) 7.5 MG tablet, Take 7.5 mg by mouth daily., Disp: , Rfl:  .  metFORMIN (GLUCOPHAGE) 1000 MG tablet, Take 1,000 mg by  mouth 2 (two) times daily with a meal., Disp: , Rfl:  .  simvastatin (ZOCOR) 40 MG tablet, Take 40 mg by mouth at bedtime. , Disp: , Rfl:  No current facility-administered medications for this visit.   Facility-Administered Medications Ordered in Other Visits:  .  cyanocobalamin ((VITAMIN B-12)) injection 1,000 mcg, 1,000 mcg, Intramuscular, Once, Sindy Guadeloupe, MD  Physical exam:  Vitals:   08/20/17 1451  BP: (!) 143/76  Pulse: 68  Resp: 16  Temp: (!) 97.1 F (36.2 C)  TempSrc: Tympanic  Weight: 143 lb (64.9 kg)   Physical Exam  Constitutional: She is oriented to person, place, and time and well-developed, well-nourished, and in no distress.  HENT:  Head: Normocephalic and atraumatic.  Eyes: EOM are normal. Pupils are equal, round, and reactive to light.  Neck: Normal range of motion.  Cardiovascular: Normal rate, regular rhythm and normal heart sounds.  Pulmonary/Chest: Effort normal and breath sounds normal.  Abdominal: Soft. Bowel sounds are normal.  Neurological: She is alert and oriented to person, place, and time.  Skin: Skin is warm and dry.     CMP Latest Ref Rng & Units 07/14/2015  Glucose 65 - 99 mg/dL 84  BUN 6 - 20 mg/dL 11  Creatinine 0.44 - 1.00 mg/dL 0.60  Sodium 135 - 145 mmol/L 140  Potassium 3.5 - 5.1 mmol/L 3.8  Chloride 101 - 111 mmol/L 109  CO2 22 - 32 mmol/L 27  Calcium 8.9 - 10.3 mg/dL 8.7(L)   CBC Latest Ref Rng & Units 08/20/2017  WBC 3.6 - 11.0 K/uL 2.9(L)  Hemoglobin 12.0 - 16.0 g/dL 12.2  Hematocrit 35.0 - 47.0 % 37.9  Platelets 150 - 440 K/uL 209     Assessment and plan- Patient is a 84 y.o. female with following issues:  1. Iron deficiency anemia- last dose of feraheme was in feb 2018. Hb stable around 12 since then. Iron studies from today are pending. If they come back low, I will schedule 2 more doses of feraheme and touch base with GI as well regarding need for capsule endoscopy  2. B12 deficiency- levels checked 6 months back  were low. Patient did not take her po b12 and does not want to come for monthly shots if possib.e she is agreeable to start taking po b12 1000 mcg po daily. Repeat levels in 3 and 6 months. If levels are still low at 3 months, will switch to b12 shots  3. Leukopenia- wbc typically ranges round 3 in the past. Lower today at 2.9. Differential shows lymphopenia- could be due to b12 deficiency versus MDS. Repeat check in 3 months after B12 supplementation  Cbc ferritin and iron studies and b12 in 3 and 6 months and I will see her back in 6 months   Visit Diagnosis 1. Iron deficiency anemia, unspecified iron deficiency anemia type   2. Anemia due to vitamin B12 deficiency, unspecified B12 deficiency type   3. Lymphocytopenia      Dr. Randa Evens, MD, MPH [facility] Pager- [phone] 08/20/2017 2:36 PM

## 2017-08-21 ENCOUNTER — Other Ambulatory Visit: Payer: Self-pay | Admitting: Oncology

## 2017-08-23 ENCOUNTER — Inpatient Hospital Stay: Payer: Medicare Other

## 2017-08-23 VITALS — BP 137/68 | HR 68 | Temp 97.7°F

## 2017-08-23 DIAGNOSIS — E538 Deficiency of other specified B group vitamins: Secondary | ICD-10-CM

## 2017-08-23 DIAGNOSIS — D500 Iron deficiency anemia secondary to blood loss (chronic): Secondary | ICD-10-CM

## 2017-08-23 DIAGNOSIS — D509 Iron deficiency anemia, unspecified: Secondary | ICD-10-CM | POA: Diagnosis not present

## 2017-08-23 MED ORDER — SODIUM CHLORIDE 0.9 % IV SOLN
510.0000 mg | Freq: Once | INTRAVENOUS | Status: AC
  Administered 2017-08-23: 510 mg via INTRAVENOUS
  Filled 2017-08-23: qty 17

## 2017-08-23 MED ORDER — SODIUM CHLORIDE 0.9 % IV SOLN
Freq: Once | INTRAVENOUS | Status: AC
  Administered 2017-08-23: 14:00:00 via INTRAVENOUS
  Filled 2017-08-23: qty 1000

## 2017-08-30 ENCOUNTER — Inpatient Hospital Stay: Payer: Medicare Other

## 2017-08-30 VITALS — BP 108/68 | HR 60 | Temp 98.0°F | Resp 18

## 2017-08-30 DIAGNOSIS — D509 Iron deficiency anemia, unspecified: Secondary | ICD-10-CM | POA: Diagnosis not present

## 2017-08-30 DIAGNOSIS — D500 Iron deficiency anemia secondary to blood loss (chronic): Secondary | ICD-10-CM

## 2017-08-30 DIAGNOSIS — E538 Deficiency of other specified B group vitamins: Secondary | ICD-10-CM

## 2017-08-30 MED ORDER — SODIUM CHLORIDE 0.9 % IV SOLN
Freq: Once | INTRAVENOUS | Status: AC
  Administered 2017-08-30: 14:00:00 via INTRAVENOUS
  Filled 2017-08-30: qty 1000

## 2017-08-30 MED ORDER — SODIUM CHLORIDE 0.9 % IV SOLN
510.0000 mg | Freq: Once | INTRAVENOUS | Status: AC
  Administered 2017-08-30: 510 mg via INTRAVENOUS
  Filled 2017-08-30: qty 17

## 2017-10-15 ENCOUNTER — Other Ambulatory Visit: Payer: Self-pay | Admitting: Gastroenterology

## 2017-10-15 DIAGNOSIS — D509 Iron deficiency anemia, unspecified: Secondary | ICD-10-CM

## 2017-10-23 ENCOUNTER — Ambulatory Visit
Admission: RE | Admit: 2017-10-23 | Discharge: 2017-10-23 | Disposition: A | Discharge status: Home / Self Care | Payer: Medicare Other | Source: Ambulatory Visit | Attending: Gastroenterology | Admitting: Gastroenterology

## 2017-10-23 DIAGNOSIS — K228 Other specified diseases of esophagus: Secondary | ICD-10-CM | POA: Insufficient documentation

## 2017-10-23 DIAGNOSIS — Q394 Esophageal web: Secondary | ICD-10-CM | POA: Diagnosis not present

## 2017-10-23 DIAGNOSIS — K219 Gastro-esophageal reflux disease without esophagitis: Secondary | ICD-10-CM | POA: Insufficient documentation

## 2017-10-23 DIAGNOSIS — K449 Diaphragmatic hernia without obstruction or gangrene: Secondary | ICD-10-CM | POA: Diagnosis not present

## 2017-10-23 DIAGNOSIS — D509 Iron deficiency anemia, unspecified: Secondary | ICD-10-CM | POA: Diagnosis present

## 2017-11-01 ENCOUNTER — Ambulatory Visit: Admit: 2017-11-01 | Payer: Medicare Other | Admitting: Gastroenterology

## 2017-11-01 SURGERY — ESOPHAGOGASTRODUODENOSCOPY (EGD) WITH PROPOFOL
Anesthesia: General

## 2017-11-08 ENCOUNTER — Other Ambulatory Visit (HOSPITAL_COMMUNITY): Payer: Self-pay | Admitting: Nurse Practitioner

## 2017-11-08 DIAGNOSIS — R413 Other amnesia: Secondary | ICD-10-CM

## 2017-11-15 ENCOUNTER — Ambulatory Visit (HOSPITAL_COMMUNITY)
Admission: RE | Admit: 2017-11-15 | Discharge: 2017-11-15 | Disposition: A | Discharge status: Home / Self Care | Payer: Medicare Other | Source: Ambulatory Visit | Attending: Nurse Practitioner | Admitting: Nurse Practitioner

## 2017-11-15 DIAGNOSIS — R413 Other amnesia: Secondary | ICD-10-CM | POA: Insufficient documentation

## 2017-11-15 DIAGNOSIS — G319 Degenerative disease of nervous system, unspecified: Secondary | ICD-10-CM | POA: Insufficient documentation

## 2017-11-19 ENCOUNTER — Inpatient Hospital Stay: Payer: Medicare Other | Attending: Oncology

## 2017-12-31 DIAGNOSIS — F0280 Dementia in other diseases classified elsewhere without behavioral disturbance: Secondary | ICD-10-CM | POA: Insufficient documentation

## 2018-02-18 ENCOUNTER — Inpatient Hospital Stay: Payer: Medicare Other | Admitting: Oncology

## 2018-02-18 ENCOUNTER — Inpatient Hospital Stay: Payer: Medicare Other | Attending: Oncology

## 2018-09-05 ENCOUNTER — Ambulatory Visit: Payer: Medicare Other | Admitting: Podiatry

## 2018-09-08 ENCOUNTER — Ambulatory Visit: Payer: Medicare Other | Admitting: Podiatry

## 2018-09-11 ENCOUNTER — Ambulatory Visit: Payer: Medicare Other | Admitting: Podiatry

## 2018-09-11 ENCOUNTER — Encounter: Payer: Self-pay | Admitting: Podiatry

## 2018-09-11 DIAGNOSIS — B351 Tinea unguium: Secondary | ICD-10-CM | POA: Diagnosis not present

## 2018-09-11 DIAGNOSIS — E119 Type 2 diabetes mellitus without complications: Secondary | ICD-10-CM | POA: Diagnosis not present

## 2018-09-11 DIAGNOSIS — E1142 Type 2 diabetes mellitus with diabetic polyneuropathy: Secondary | ICD-10-CM

## 2018-09-11 DIAGNOSIS — M79676 Pain in unspecified toe(s): Secondary | ICD-10-CM | POA: Diagnosis not present

## 2018-09-11 NOTE — Progress Notes (Signed)
Complaint:  Visit Type: Patient returns to my office for continued preventative foot care services. Patient says the nails are painful walking and wearing her shoes.  She is unable to self treat.. Patient has been diagnosed with DM with neuropathy. The patient presents for preventative foot care services.   Podiatric Exam: Vascular: dorsalis pedis and posterior tibial pulses are palpable bilateral. Capillary return is immediate. Temperature gradient is WNL. Skin turgor WNL  Sensorium: Diminished  Semmes Weinstein monofilament test. Normal tactile sensation bilaterally. Nail Exam: Pt has thick disfigured discolored nails with subungual debris noted bilateral entire nail hallux through fifth toenails Ulcer Exam: There is no evidence of ulcer or pre-ulcerative changes or infection. Orthopedic Exam: Muscle tone and strength are WNL. No limitations in general ROM. No crepitus or effusions noted. Foot type and digits show no abnormalities. Bony prominences are present fifth digit right foot. Skin: No Porokeratosis. No infection or ulcers.  Heloma molle 4/5 toes right foot.  No infection noted.  Diagnosis:  Onychomycosis  B/L  Diabetes with neuropathy.  Treatment & Plan Procedures and Treatment: Consent by patient was obtained for treatment procedures. The patient understood the discussion of treatment and procedures well. All questions were answered thoroughly reviewed. Debridement and grinding of long thick nails. Return Visit-Office Procedure: Patient instructed to return to the office for a follow up visit 3 months for continued evaluation and treatment.    Gardiner Barefoot DPM

## 2019-01-29 ENCOUNTER — Ambulatory Visit: Payer: Medicare Other | Admitting: Podiatry

## 2019-01-29 ENCOUNTER — Encounter: Payer: Self-pay | Admitting: Podiatry

## 2019-01-29 ENCOUNTER — Other Ambulatory Visit: Payer: Self-pay

## 2019-01-29 DIAGNOSIS — E1142 Type 2 diabetes mellitus with diabetic polyneuropathy: Secondary | ICD-10-CM | POA: Diagnosis not present

## 2019-01-29 DIAGNOSIS — B351 Tinea unguium: Secondary | ICD-10-CM

## 2019-01-29 DIAGNOSIS — M79675 Pain in left toe(s): Secondary | ICD-10-CM | POA: Diagnosis not present

## 2019-01-29 DIAGNOSIS — M79674 Pain in right toe(s): Secondary | ICD-10-CM

## 2019-01-29 DIAGNOSIS — E1140 Type 2 diabetes mellitus with diabetic neuropathy, unspecified: Secondary | ICD-10-CM | POA: Insufficient documentation

## 2019-01-29 NOTE — Progress Notes (Signed)
Complaint:  Visit Type: Patient returns to my office for continued preventative foot care services. Patient says the nails are painful walking and wearing her shoes.  She is unable to self treat.. Patient has been diagnosed with DM with neuropathy. The patient presents for preventative foot care services.   Podiatric Exam: Vascular: dorsalis pedis and posterior tibial pulses are palpable bilateral. Capillary return is immediate. Temperature gradient is WNL. Skin turgor WNL  Sensorium: Diminished  Semmes Weinstein monofilament test. Normal tactile sensation bilaterally. Nail Exam: Pt has thick disfigured discolored nails with subungual debris noted bilateral entire nail hallux through fifth toenails Ulcer Exam: There is no evidence of ulcer or pre-ulcerative changes or infection. Orthopedic Exam: Muscle tone and strength are WNL. No limitations in general ROM. No crepitus or effusions noted. Foot type and digits show no abnormalities. Bony prominences are present fifth digit right foot. Skin: No Porokeratosis. No infection or ulcers.    Asymptomatic  HD  Fifth toe right foot.  Diagnosis:  Onychomycosis  B/L  Diabetes with neuropathy.  Treatment & Plan Procedures and Treatment: Consent by patient was obtained for treatment procedures. The patient understood the discussion of treatment and procedures well. All questions were answered thoroughly reviewed. Debridement and grinding of long thick nails. Return Visit-Office Procedure: Patient instructed to return to the office for a follow up visit 3 months for continued evaluation and treatment.    Gardiner Barefoot DPM

## 2019-03-17 DIAGNOSIS — I10 Essential (primary) hypertension: Secondary | ICD-10-CM | POA: Insufficient documentation

## 2019-04-30 ENCOUNTER — Other Ambulatory Visit: Payer: Self-pay

## 2019-04-30 ENCOUNTER — Encounter: Payer: Self-pay | Admitting: Podiatry

## 2019-04-30 ENCOUNTER — Ambulatory Visit (INDEPENDENT_AMBULATORY_CARE_PROVIDER_SITE_OTHER): Payer: Medicare Other | Admitting: Podiatry

## 2019-04-30 DIAGNOSIS — M79675 Pain in left toe(s): Secondary | ICD-10-CM | POA: Diagnosis not present

## 2019-04-30 DIAGNOSIS — B351 Tinea unguium: Secondary | ICD-10-CM | POA: Diagnosis not present

## 2019-04-30 DIAGNOSIS — E1142 Type 2 diabetes mellitus with diabetic polyneuropathy: Secondary | ICD-10-CM | POA: Diagnosis not present

## 2019-04-30 DIAGNOSIS — M79674 Pain in right toe(s): Secondary | ICD-10-CM | POA: Diagnosis not present

## 2019-04-30 NOTE — Progress Notes (Signed)
Complaint:  Visit Type: Patient returns to my office for continued preventative foot care services. Patient says the nails are painful walking and wearing her shoes.  She is unable to self treat.. Patient has been diagnosed with DM with neuropathy. The patient presents for preventative foot care services.   Podiatric Exam: Vascular: dorsalis pedis and posterior tibial pulses are palpable bilateral. Capillary return is immediate. Temperature gradient is WNL. Skin turgor WNL  Sensorium: Diminished  Semmes Weinstein monofilament test. Normal tactile sensation bilaterally. Nail Exam: Pt has thick disfigured discolored nails with subungual debris noted bilateral entire nail hallux through fifth toenails Ulcer Exam: There is no evidence of ulcer or pre-ulcerative changes or infection. Orthopedic Exam: Muscle tone and strength are WNL. No limitations in general ROM. No crepitus or effusions noted. Foot type and digits show no abnormalities. Bony prominences are present fifth digit right foot. Skin: No Porokeratosis. No infection or ulcers.    Asymptomatic  HD  Fifth toe right foot. Asymptomatic HM 4/5 right foot.  Diagnosis:  Onychomycosis  B/L  Diabetes with neuropathy.  Treatment & Plan Procedures and Treatment: Consent by patient was obtained for treatment procedures. The patient understood the discussion of treatment and procedures well. All questions were answered thoroughly reviewed. Debridement and grinding of long thick nails. Return Visit-Office Procedure: Patient instructed to return to the office for a follow up visit 3 months for continued evaluation and treatment.    Gardiner Barefoot DPM

## 2019-05-18 ENCOUNTER — Other Ambulatory Visit: Payer: Self-pay

## 2019-05-18 DIAGNOSIS — Z20822 Contact with and (suspected) exposure to covid-19: Secondary | ICD-10-CM

## 2019-05-19 LAB — NOVEL CORONAVIRUS, NAA: SARS-CoV-2, NAA: NOT DETECTED

## 2019-07-09 ENCOUNTER — Other Ambulatory Visit: Payer: Self-pay

## 2019-07-09 DIAGNOSIS — Z20822 Contact with and (suspected) exposure to covid-19: Secondary | ICD-10-CM

## 2019-07-12 LAB — NOVEL CORONAVIRUS, NAA: SARS-CoV-2, NAA: NOT DETECTED

## 2019-07-23 ENCOUNTER — Other Ambulatory Visit: Payer: Self-pay

## 2019-07-23 ENCOUNTER — Ambulatory Visit: Payer: Medicare Other | Admitting: Podiatry

## 2019-07-23 ENCOUNTER — Encounter: Payer: Self-pay | Admitting: Podiatry

## 2019-07-23 DIAGNOSIS — B351 Tinea unguium: Secondary | ICD-10-CM | POA: Diagnosis not present

## 2019-07-23 DIAGNOSIS — M79674 Pain in right toe(s): Secondary | ICD-10-CM

## 2019-07-23 DIAGNOSIS — E1142 Type 2 diabetes mellitus with diabetic polyneuropathy: Secondary | ICD-10-CM

## 2019-07-23 DIAGNOSIS — M79675 Pain in left toe(s): Secondary | ICD-10-CM | POA: Diagnosis not present

## 2019-07-23 NOTE — Progress Notes (Signed)
Complaint:  Visit Type: Patient returns to my office for continued preventative foot care services. Patient says the nails are painful walking and wearing her shoes.  She is unable to self treat.. Patient has been diagnosed with DM with neuropathy. The patient presents for preventative foot care services.   Podiatric Exam: Vascular: dorsalis pedis and posterior tibial pulses are palpable bilateral. Capillary return is immediate. Temperature gradient is WNL. Skin turgor WNL  Sensorium: Diminished  Semmes Weinstein monofilament test. Normal tactile sensation bilaterally. Nail Exam: Pt has thick disfigured discolored nails with subungual debris noted bilateral entire nail hallux through fifth toenails Ulcer Exam: There is no evidence of ulcer or pre-ulcerative changes or infection. Orthopedic Exam: Muscle tone and strength are WNL. No limitations in general ROM. No crepitus or effusions noted. Foot type and digits show no abnormalities. Bony prominences are present fifth digit right foot. Skin: No Porokeratosis. No infection or ulcers.    Asymptomatic  HD  Fifth toe right foot. Asymptomatic HM 4/5 right foot.  Diagnosis:  Onychomycosis  B/L  Diabetes with neuropathy.  Treatment & Plan Procedures and Treatment: Consent by patient was obtained for treatment procedures. The patient understood the discussion of treatment and procedures well. All questions were answered thoroughly reviewed. Debridement and grinding of long thick nails. Return Visit-Office Procedure: Patient instructed to return to the office for a follow up visit 4 months for continued evaluation and treatment.    Gardiner Barefoot DPM

## 2019-08-11 ENCOUNTER — Ambulatory Visit: Payer: Medicare PPO | Attending: Internal Medicine

## 2019-08-11 DIAGNOSIS — Z20822 Contact with and (suspected) exposure to covid-19: Secondary | ICD-10-CM

## 2019-08-13 ENCOUNTER — Telehealth: Payer: Self-pay

## 2019-08-13 LAB — NOVEL CORONAVIRUS, NAA: SARS-CoV-2, NAA: NOT DETECTED

## 2019-08-13 NOTE — Telephone Encounter (Signed)
Pt notified of negative COVID-19 results. Understanding verbalized.  Janet Gross   

## 2019-11-26 ENCOUNTER — Ambulatory Visit: Payer: Medicare Other | Admitting: Podiatry

## 2020-10-31 ENCOUNTER — Emergency Department: Payer: Medicare PPO

## 2020-10-31 ENCOUNTER — Other Ambulatory Visit: Payer: Self-pay

## 2020-10-31 ENCOUNTER — Encounter: Payer: Self-pay | Admitting: Emergency Medicine

## 2020-10-31 ENCOUNTER — Emergency Department
Admission: EM | Admit: 2020-10-31 | Discharge: 2020-10-31 | Disposition: A | Discharge status: Home / Self Care | Payer: Medicare PPO | Attending: Emergency Medicine | Admitting: Emergency Medicine

## 2020-10-31 DIAGNOSIS — F0390 Unspecified dementia without behavioral disturbance: Secondary | ICD-10-CM | POA: Diagnosis not present

## 2020-10-31 DIAGNOSIS — Z87891 Personal history of nicotine dependence: Secondary | ICD-10-CM | POA: Diagnosis not present

## 2020-10-31 DIAGNOSIS — Z7984 Long term (current) use of oral hypoglycemic drugs: Secondary | ICD-10-CM | POA: Insufficient documentation

## 2020-10-31 DIAGNOSIS — Z79899 Other long term (current) drug therapy: Secondary | ICD-10-CM | POA: Diagnosis not present

## 2020-10-31 DIAGNOSIS — R531 Weakness: Secondary | ICD-10-CM

## 2020-10-31 DIAGNOSIS — E039 Hypothyroidism, unspecified: Secondary | ICD-10-CM | POA: Insufficient documentation

## 2020-10-31 DIAGNOSIS — K5900 Constipation, unspecified: Secondary | ICD-10-CM | POA: Insufficient documentation

## 2020-10-31 DIAGNOSIS — E119 Type 2 diabetes mellitus without complications: Secondary | ICD-10-CM | POA: Diagnosis not present

## 2020-10-31 DIAGNOSIS — E1140 Type 2 diabetes mellitus with diabetic neuropathy, unspecified: Secondary | ICD-10-CM | POA: Insufficient documentation

## 2020-10-31 DIAGNOSIS — Z7982 Long term (current) use of aspirin: Secondary | ICD-10-CM | POA: Diagnosis not present

## 2020-10-31 LAB — URINALYSIS, COMPLETE (UACMP) WITH MICROSCOPIC
Bacteria, UA: NONE SEEN
Bilirubin Urine: NEGATIVE
Glucose, UA: NEGATIVE mg/dL
Hgb urine dipstick: NEGATIVE
Ketones, ur: NEGATIVE mg/dL
Nitrite: NEGATIVE
Protein, ur: NEGATIVE mg/dL
Specific Gravity, Urine: 1.011 (ref 1.005–1.030)
pH: 5 (ref 5.0–8.0)

## 2020-10-31 LAB — COMPREHENSIVE METABOLIC PANEL
ALT: 13 U/L (ref 0–44)
AST: 17 U/L (ref 15–41)
Albumin: 4 g/dL (ref 3.5–5.0)
Alkaline Phosphatase: 60 U/L (ref 38–126)
Anion gap: 8 (ref 5–15)
BUN: 10 mg/dL (ref 8–23)
CO2: 27 mmol/L (ref 22–32)
Calcium: 9.3 mg/dL (ref 8.9–10.3)
Chloride: 104 mmol/L (ref 98–111)
Creatinine, Ser: 0.82 mg/dL (ref 0.44–1.00)
GFR, Estimated: >60 mL/min (ref >60)
Glucose, Bld: 130 mg/dL — ABNORMAL HIGH (ref 70–99)
Potassium: 3.8 mmol/L (ref 3.5–5.1)
Sodium: 139 mmol/L (ref 135–145)
Total Bilirubin: 0.8 mg/dL (ref 0.3–1.2)
Total Protein: 7.1 g/dL (ref 6.5–8.1)

## 2020-10-31 LAB — CBC
HCT: 41.7 % (ref 36.0–46.0)
Hemoglobin: 12.9 g/dL (ref 12.0–15.0)
MCH: 25.3 pg — ABNORMAL LOW (ref 26.0–34.0)
MCHC: 30.9 g/dL (ref 30.0–36.0)
MCV: 81.8 fL (ref 80.0–100.0)
Platelets: 257 10*3/uL (ref 150–400)
RBC: 5.1 MIL/uL (ref 3.87–5.11)
RDW: 15 % (ref 11.5–15.5)
WBC: 3.7 10*3/uL — ABNORMAL LOW (ref 4.0–10.5)
nRBC: 0 % (ref 0.0–0.2)

## 2020-10-31 LAB — LIPASE, BLOOD: Lipase: 39 U/L (ref 11–51)

## 2020-10-31 NOTE — ED Provider Notes (Signed)
[facility] Emergency Department Provider Note ____________________________________________   Event Date/Time   First MD Initiated Contact with Patient 10/31/20 1644     (approximate)  I have reviewed the triage vital signs and the nursing notes.  HISTORY  Chief Complaint No chief complaint on file.   HPI Janet Gross is a 87 y.o. femalewho presents to the ED for evaluation of constipation  Chart review indicates hypothyroidism on Synthroid.  DM on oral agents.  Dementia. Patient seen at [place] ED 5 days ago and diagnosed with mesenteric ischemia.  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  Vascular surgery noted good collateral flow through IMA.  Patient was deemed not a good surgical candidate and was discharged home from the ED.  Patient is brought to the ED by her son via POV due to subacute generalized weakness, shuffling gait, lessened appetite and "she just seems off with low energy."  Son reports concerned that patient has not passed a bowel movement 3-4 days, but does indicate that she has not been eating as much.  He reports that she has never complained of abdominal pain postprandially.  He denies any concerns for fever, cough, syncopal episodes, falls or injury.  Patient lives at home with 24/7 caregivers during the week, and her son cares for her on the weekends.  She is independently ambulatory without assistance device.  Here in the ED, patient reports that she feels fine and has no complaints.  Denies abdominal pain.    Past Medical History:  Diagnosis Date  . Anemia   . B12 deficiency 08/03/2016  . Diabetes mellitus without complication ([place])   . Gastric ulcer   . Hypothyroidism   . Onychomycosis 02/04/2013  . Osteoporosis   . Peptic ulcer     Patient Active Problem List   Diagnosis Date Noted  . Essential hypertension 03/17/2019  . Pain due  to onychomycosis of toenails of both feet 01/29/2019  . Diabetic neuropathy ([place]) 01/29/2019  . Late onset Alzheimer's disease without behavioral disturbance ([place]) 12/31/2017  . B12 deficiency 08/03/2016  . Iron deficiency anemia 06/27/2016  . Hypoglycemia 07/13/2015  . Controlled type 2 diabetes mellitus without complication ([place]) 63/08/6008  . Gastric ulcer 02/22/2014  . HLD (hyperlipidemia) 02/22/2014  . Adult hypothyroidism 02/22/2014  . Adiposity 02/22/2014  . Osteoporosis, 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  . Onychomycosis     Past Surgical History:  Procedure Laterality Date  . cataract left    . COLONOSCOPY    . COLONOSCOPY WITH PROPOFOL N/A 11/13/2016   Procedure: COLONOSCOPY WITH PROPOFOL;  Surgeon: Lollie Sails, MD;  Location: [facility] ENDOSCOPY;  Service: Endoscopy;  Laterality: N/A;  . ESOPHAGOGASTRODUODENOSCOPY    . ESOPHAGOGASTRODUODENOSCOPY (EGD) WITH PROPOFOL N/A 11/13/2016   Procedure: ESOPHAGOGASTRODUODENOSCOPY (EGD) WITH PROPOFOL;  Surgeon: Lollie Sails, MD;  Location: [facility] ENDOSCOPY;  Service: Endoscopy;  Laterality: N/A;  . EXCISION VAGINAL CYST    . neck artery     carotid endarterectomy    Prior to Admission medications   Medication Sig Start Date End Date Taking? Authorizing Provider  alendronate (FOSAMAX) 70 MG tablet Take 70 mg by mouth once a week. Take with a full glass of water on an empty stomach.    [provider]  aspirin EC 81 MG tablet Take 81 mg by mouth daily.     [provider]  donepezil (ARICEPT) 5 MG tablet  04/25/19  [provider]  ferrous sulfate 325 (65 FE) MG tablet Take by mouth. 06/22/16 06/22/17  [provider]  gabapentin (NEURONTIN) 300 MG capsule Take 300 mg by mouth at bedtime.     [provider]  glimepiride (AMARYL) 1 MG tablet  12/18/18   [provider]  glipiZIDE (GLUCOTROL) 5 MG tablet Take by mouth. 01/19/15   [provider]  levothyroxine (SYNTHROID) 100  MCG tablet  11/17/19   [provider]  levothyroxine (SYNTHROID) 112 MCG tablet  12/21/18   [provider]  magnesium oxide (MAG-OX) 400 MG tablet Take 400 mg by mouth daily.     [provider]  meloxicam (MOBIC) 7.5 MG tablet Take 7.5 mg by mouth daily.    [provider]  memantine (NAMENDA) 10 MG tablet Take by mouth. 01/27/19 01/27/20  [provider]  metFORMIN (GLUCOPHAGE) 1000 MG tablet Take 1,000 mg by mouth 2 (two) times daily with a meal.    [provider]  simvastatin (ZOCOR) 40 MG tablet Take 40 mg by mouth at bedtime.     [provider]  UNABLE TO FIND  01/28/19   [provider]  UNABLE TO FIND  11/29/18   [provider]  UNABLE TO FIND  11/20/18   [provider]    Allergies Sulfa antibiotics  No family history on file.  Social History Social History   Tobacco Use  . Smoking status: Former Smoker    Packs/day: 0.25    Years: 10.00    Pack years: 2.50    Types: Cigarettes    Quit date: 08/07/1963    Years since quitting: 57.2  . Smokeless tobacco: Never Used  . Tobacco comment: quit over 40 years ago  Vaping Use  . Vaping Use: Never used  Substance Use Topics  . Alcohol use: No  . Drug use: No    Review of Systems: When discussing with patient's son. Patient is unable to provide any relevant ROS due to her pleasant disorientation.  Constitutional: No fever/chills.  Positive generalized weakness Eyes: No visual changes. ENT: No sore throat. Cardiovascular: Denies chest pain. Respiratory: Denies shortness of breath. Gastrointestinal: No abdominal pain.  No nausea, no vomiting.  No diarrhea.  No constipation. Positive for poor intake Genitourinary: Negative for dysuria. Musculoskeletal: Negative for back pain. Skin: Negative for rash. Neurological: Negative for headaches, focal weakness or numbness.  ____________________________________________   PHYSICAL  EXAM:  VITAL SIGNS: Vitals:   10/31/20 1705 10/31/20 1730  BP: (!) 153/116 (!) 166/66  Pulse: (!) 58 100  Resp: 15 16  Temp:    SpO2: 97% 97%     Constitutional: Alert and pleasantly disoriented. Well appearing and in no acute distress.  Makes some jokes and asks to pray with me when I first start talking with her. Eyes: Conjunctivae are normal. PERRL. EOMI. Head: Atraumatic. Nose: No congestion/rhinnorhea. Mouth/Throat: Mucous membranes are moist.  Oropharynx non-erythematous. Neck: No stridor. No cervical spine tenderness to palpation. Cardiovascular: Normal rate, regular rhythm. Grossly normal heart sounds.  Good peripheral circulation. Respiratory: Normal respiratory effort.  No retractions. Lungs CTAB. Gastrointestinal: Soft , nondistended, nontender to palpation. No CVA tenderness.  Benign throughout Musculoskeletal: No lower extremity tenderness nor edema.  No joint effusions. No signs of acute trauma. Neurologic:  Normal speech and language. No gross focal neurologic deficits are appreciated. Cranial nerves II through XII intact 5/5 strength and sensation in all 4 extremities Skin:  Skin is warm, dry and intact.  No rash noted. Psychiatric: Mood and affect are normal. Speech and behavior are normal.  ____________________________________________   LABS (all labs ordered are listed, but only abnormal results are displayed)  Labs Reviewed  COMPREHENSIVE METABOLIC PANEL - Abnormal; Notable for the following components:      Result Value   Glucose, Bld 130 (*)    All other components within normal limits  CBC - Abnormal; Notable for the following components:   WBC 3.7 (*)    MCH 25.3 (*)    All other components within normal limits  URINALYSIS, COMPLETE (UACMP) WITH MICROSCOPIC - Abnormal; Notable for the following components:   Color, Urine YELLOW (*)    APPearance CLEAR (*)    Leukocytes,Ua SMALL (*)    All other components within normal limits  LIPASE, BLOOD    ____________________________________________  RADIOLOGY  ED MD interpretation: CT head reviewed by me without evidence of acute intracranial pathology. Acute abdominal series reviewed by me without evidence of acute cardiopulmonary pathology or SBO.  Constipation/colonic stool burden noted.  Official radiology report(s): CT Head Wo Contrast  Result Date: 10/31/2020 CLINICAL DATA:  Altered mental status. EXAM: CT HEAD WITHOUT CONTRAST TECHNIQUE: Contiguous axial images were obtained from the base of the skull through the vertex without intravenous contrast. COMPARISON:  April 30, 2008. FINDINGS: Brain: Mild chronic ischemic white matter disease is noted. No mass effect or midline shift is noted. Ventricular size is within normal limits. There is no evidence of mass lesion, hemorrhage or acute infarction. Vascular: No hyperdense vessel or unexpected calcification. Skull: Normal. Negative for fracture or focal lesion. Sinuses/Orbits: No acute finding. Other: None. IMPRESSION: Mild chronic ischemic white matter disease. No acute intracranial abnormality seen. Electronically Signed   By: Marijo Conception M.D.   On: 10/31/2020 18:12   DG Abdomen Acute W/Chest  Result Date: 10/31/2020 CLINICAL DATA:  constipation for 3 days, weak and poor intake. Eval PNA, SBO EXAM: DG ABDOMEN ACUTE WITH 1 VIEW CHEST COMPARISON:  CT 05/26/2013 FINDINGS: Linear subsegmental atelectasis or scarring in the left lower lobe. Lungs are otherwise clear. The heart is normal in size. Aortic atherosclerosis. No pleural fluid or pulmonary edema. No bowel dilatation to suggest obstruction. No abnormal gastric distension. Moderate volume of stool in the ascending, transverse and descending colon. No abnormal rectal distention. Calcified fibroids in the pelvis as seen on prior CT. No radiopaque calculi. No acute osseous abnormalities are seen IMPRESSION: 1. No acute chest findings.  No evidence of pneumonia. 2. Normal bowel gas  pattern. Moderate volume of colonic stool. No evidence of obstruction. 3. Uterine fibroids. Electronically Signed   By: Keith Rake M.D.   On: 10/31/2020 18:23    ____________________________________________   PROCEDURES and INTERVENTIONS  Procedure(s) performed (including Critical Care):  .1-3 Lead EKG Interpretation Performed by: Vladimir Crofts, MD Authorized by: Vladimir Crofts, MD     Interpretation: normal     ECG rate:  58   ECG rate assessment: bradycardic     Rhythm: sinus bradycardia     Ectopy: none     Conduction: normal      Medications - No data to display  ____________________________________________   MDM / ED COURSE   87 year old woman is brought into the ED by her son due to subacute generalized weakness without evidence of acute derangements and amenable to outpatient management with treatment for constipation and vascular surgery follow-up.  Hemodynamically stable.  Exam without evidence of acute derangements.  She looks well and has no complaints  or needs.  Benign abdomen, no signs of trauma, no signs of distress or any neurovascular deficits.  Work-up is benign.  UA without infectious features to suggest acute cystitis contributing to her weakness.  Blood work without acute derangements.  CT head demonstrates no evidence of ICH or CVA.  Acute abdominal series without evidence of infiltrate or pneumonia, or SBO.  She has no pain to suggest acute mesenteric ischemia.  Constipation noted, and discuss MiraLAX dosing with son to be performed at home.  We discussed following up with vascular surgery as an outpatient to discuss elective intervention, but I see no indication for urgent consultation this evening.  We discussed return precautions for the ED and patient is stable for outpatient management.  Clinical Course as of 10/31/20 1936  Mon Oct 31, 2020  1838 Reassessed.  Educated patient and son on reassuring work-up.  Patient continues to feel well and denies  complaints.  Continues to have a benign abdomen.  We discussed MiraLAX for constipation.  We discussed following up with vascular surgery in the clinic and we discussed return precautions for the ED [DS]    Clinical Course User Index [DS] Vladimir Crofts, MD    ____________________________________________   FINAL CLINICAL IMPRESSION(S) / ED DIAGNOSES  Final diagnoses:  Generalized weakness     ED Discharge Orders    None       Kaycen Whitworth   Note:  This document was prepared using Dragon voice recognition software and may include unintentional dictation errors.   Vladimir Crofts, MD 10/31/20 [phone]

## 2020-10-31 NOTE — Discharge Instructions (Signed)
As we discussed, please use MiraLAX for constipation. Mix in her favorite noncarbonated drink, such as milk, water or juice.  Mix 1-2 capfuls of MiraLAX powder into the drink per dose. Repeat this 1-2 times per day until she is passing bowel movements, and then cut back to may be just 1 capful per day.  Please follow-up with the vascular surgeons in the clinic, and I have included their information for your reference.  If she develops any severe pain in her abdomen or worsening symptoms despite the MiraLAX, please return to the ED.

## 2020-10-31 NOTE — ED Triage Notes (Addendum)
Son reports, per caregivers, patient was sleepy, poor PO, emesis x 1 last Monday, Tuesday, Wednesday.  Seen through [facility] and had testing done.  Patient was transferred from [facility] to [place].  Seen by PCP today.  Son reports that scans showed some possible decreased blood flow to intestines.  PO intake has improved, no BM x 4 days, and energy levels remain low.  Patient also seeing double per son, which is a new finding.  Patient is awake and alert.  Skin wam and dry. NAD

## 2020-11-04 ENCOUNTER — Other Ambulatory Visit (INDEPENDENT_AMBULATORY_CARE_PROVIDER_SITE_OTHER): Payer: Self-pay | Admitting: Vascular Surgery

## 2020-11-04 DIAGNOSIS — K55059 Acute (reversible) ischemia of intestine, part and extent unspecified: Secondary | ICD-10-CM

## 2020-11-04 DIAGNOSIS — K559 Vascular disorder of intestine, unspecified: Secondary | ICD-10-CM

## 2020-11-06 NOTE — H&P (View-Only) (Signed)
MRN : [id number]  Janet Gross is a 87 y.o. (07-20-1931) female who presents with chief complaint of No chief complaint on file. Marland Kitchen  History of Present Illness:   I am asked to evaluate the patient for the complaint of abdominal pain with uncertain etiology.  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  The patient's son has noted she has had significant weight loss as well as nausea.  The patient does substantiate food fear, particular foods do not seem to aggravate or alleviate the symptoms.  There is no history of bloody bowel movements or diarrhea.    No history of peptic ulcer disease.   No prior peripheral angiograms or vascular interventions.  The patient denies amaurosis fugax or recent TIA symptoms. There are no recent neurological changes noted. The patient denies claudication symptoms or rest pain symptoms. The patient denies history of DVT, PE or superficial thrombophlebitis. 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   No outpatient medications have been marked as taking for the 11/07/20 encounter (Appointment) with Delana Meyer, Dolores Lory, MD.    Past Medical History:  Diagnosis Date  . Anemia   . B12 deficiency 08/03/2016  . Diabetes mellitus without complication ([place])   . Gastric ulcer   . Hypothyroidism   . Onychomycosis 02/04/2013  . Osteoporosis   . Peptic ulcer     Past Surgical History:  Procedure Laterality Date  . cataract left    . COLONOSCOPY    . COLONOSCOPY WITH PROPOFOL N/A 11/13/2016   Procedure: COLONOSCOPY WITH PROPOFOL;  Surgeon: Lollie Sails, MD;  Location: [facility] ENDOSCOPY;  Service: Endoscopy;  Laterality: N/A;  . ESOPHAGOGASTRODUODENOSCOPY    .  ESOPHAGOGASTRODUODENOSCOPY (EGD) WITH PROPOFOL N/A 11/13/2016   Procedure: ESOPHAGOGASTRODUODENOSCOPY (EGD) WITH PROPOFOL;  Surgeon: Lollie Sails, MD;  Location: [facility] ENDOSCOPY;  Service: Endoscopy;  Laterality: N/A;  . EXCISION VAGINAL CYST    . neck artery     carotid endarterectomy    Social History Social History   Tobacco Use  . Smoking status: Former Smoker    Packs/day: 0.25    Years: 10.00    Pack years: 2.50    Types: Cigarettes    Quit date: 08/07/1963    Years since quitting: 57.2  . Smokeless tobacco: Never Used  . Tobacco comment: quit over 40 years ago  Vaping Use  . Vaping Use: Never used  Substance Use Topics  . Alcohol use: No  . Drug use: No    Family History No family history of bleeding/clotting disorders, porphyria or autoimmune disease   Allergies  Allergen Reactions  . Sulfa Antibiotics Other (See Comments)    INCREASES HER BLOOD SUGAR , PT STATED  INCREASES HER BLOOD SUGAR , PT STATED  INCREASES HER BLOOD SUGAR , PT STATED      REVIEW OF SYSTEMS (Negative unless checked)  Constitutional: [] Weight loss  [] Fever  [] Chills Cardiac: [] Chest pain   [] Chest pressure   [] Palpitations   [] Shortness of breath when laying flat   [] Shortness of breath with exertion. Vascular:  [] Pain in legs with walking   [] Pain in legs at rest  [] History of DVT   [] Phlebitis   [] Swelling in legs   [] Varicose veins   []   Non-healing ulcers Pulmonary:   [] Uses home oxygen   [] Productive cough   [] Hemoptysis   [] Wheeze  [] COPD   [] Asthma Neurologic:  [] Dizziness   [] Seizures   [] History of stroke   [] History of TIA  [] Aphasia   [] Vissual changes   [] Weakness or numbness in arm   [] Weakness or numbness in leg Musculoskeletal:   [] Joint swelling   [] Joint pain   [] Low back pain Hematologic:  [] Easy bruising  [] Easy bleeding   [] Hypercoagulable state   [] Anemic Gastrointestinal:  [] Diarrhea   [x] Vomiting  [] Gastroesophageal reflux/heartburn   [] Difficulty  swallowing. Genitourinary:  [] Chronic kidney disease   [] Difficult urination  [] Frequent urination   [] Blood in urine Skin:  [] Rashes   [] Ulcers  Psychological:  [] History of anxiety   []  History of major depression.  Physical Examination  There were no vitals filed for this visit. There is no height or weight on file to calculate BMI. Gen: WD/WN, NAD Head: [place]/AT, No temporalis wasting.  Ear/Nose/Throat: Hearing grossly intact, nares w/o erythema or drainage, poor dentition Eyes: PER, EOMI, sclera nonicteric.  Neck: Supple, no masses.  No bruit or JVD.  Pulmonary:  Good air movement, clear to auscultation bilaterally, no use of accessory muscles.  Cardiac: RRR, normal S1, S2, no Murmurs. Vascular:  Vessel Right Left  Radial Palpable Palpable  Brachial Palpable Palpable  Carotid Palpable Palpable  Gastrointestinal: soft, non-distended. No guarding/no peritoneal signs.  Musculoskeletal: M/S 5/5 throughout.  No deformity or atrophy.  Neurologic: CN 2-12 intact. Pain and light touch intact in extremities.  Symmetrical.  Speech is fluent. Motor exam as listed above. Psychiatric: Judgment intact, Mood & affect appropriate for pt's clinical situation. Dermatologic: No rashes or ulcers noted.  No changes consistent with cellulitis.   CBC Lab Results  Component Value Date   WBC 3.7 (L) 10/31/2020   HGB 12.9 10/31/2020   HCT 41.7 10/31/2020   MCV 81.8 10/31/2020   PLT 257 10/31/2020    BMET    Component Value Date/Time   NA 139 10/31/2020 1643   NA 139 08/19/2013 1919   K 3.8 10/31/2020 1643   K 3.8 08/19/2013 1919   CL 104 10/31/2020 1643   CL 106 08/19/2013 1919   CO2 27 10/31/2020 1643   CO2 29 08/19/2013 1919   GLUCOSE 130 (H) 10/31/2020 1643   GLUCOSE 52 (L) 08/19/2013 1919   BUN 10 10/31/2020 1643   BUN 14 08/19/2013 1919   CREATININE 0.82 10/31/2020 1643   CREATININE 0.81 08/19/2013 1919   CALCIUM 9.3 10/31/2020 1643   CALCIUM 9.3 08/19/2013 1919   GFRNONAA >60  10/31/2020 1643   GFRNONAA >60 08/19/2013 1919   GFRAA >60 07/14/2015 0535   GFRAA >60 08/19/2013 1919   Estimated Creatinine Clearance: 45.2 mL/min (by C-G formula based on SCr of 0.82 mg/dL).  COAG No results found for: INR, PROTIME  Radiology CT Head Wo Contrast  Result Date: 10/31/2020 CLINICAL DATA:  Altered mental status. EXAM: CT HEAD WITHOUT CONTRAST TECHNIQUE: Contiguous axial images were obtained from the base of the skull through the vertex without intravenous contrast. COMPARISON:  April 30, 2008. FINDINGS: Brain: Mild chronic ischemic white matter disease is noted. No mass effect or midline shift is noted. Ventricular size is within normal limits. There is no evidence of mass lesion, hemorrhage or acute infarction. Vascular: No hyperdense vessel or unexpected calcification. Skull: Normal. Negative for fracture or focal lesion. Sinuses/Orbits: No acute finding. Other: None. IMPRESSION: Mild chronic ischemic white matter disease. No acute  intracranial abnormality seen. Electronically Signed   By: Marijo Conception M.D.   On: 10/31/2020 18:12   DG Abdomen Acute W/Chest  Result Date: 10/31/2020 CLINICAL DATA:  constipation for 3 days, weak and poor intake. Eval PNA, SBO EXAM: DG ABDOMEN ACUTE WITH 1 VIEW CHEST COMPARISON:  CT 05/26/2013 FINDINGS: Linear subsegmental atelectasis or scarring in the left lower lobe. Lungs are otherwise clear. The heart is normal in size. Aortic atherosclerosis. No pleural fluid or pulmonary edema. No bowel dilatation to suggest obstruction. No abnormal gastric distension. Moderate volume of stool in the ascending, transverse and descending colon. No abnormal rectal distention. Calcified fibroids in the pelvis as seen on prior CT. No radiopaque calculi. No acute osseous abnormalities are seen IMPRESSION: 1. No acute chest findings.  No evidence of pneumonia. 2. Normal bowel gas pattern. Moderate volume of colonic stool. No evidence of obstruction. 3. Uterine  fibroids. Electronically Signed   By: Keith Rake M.D.   On: 10/31/2020 18:23     Assessment/Plan 1. Chronic mesenteric ischemia (HCC) Recommend:  The patient has evidence of severe atherosclerotic changes of the mesenteric arteries associated with weight loss as well as abdominal pain and N/V.  This represents a high risk for bowel infarction and death.  Patient should undergo angiography of the mesenteric arteries with the hope for intervention to eliminate the ischemic symptoms.    The risks and benefits as well as the alternative therapies was discussed in detail with the patient.  All questions were answered.  Patient agrees to proceed with angiography and intervention.  The patient will follow up with me after the angiogram.  2. Essential hypertension Continue antihypertensive medications as already ordered, these medications have been reviewed and there are no changes at this time.   3. Controlled type 2 diabetes mellitus without complication, unspecified whether long term insulin use ([place]) Continue hypoglycemic medications as already ordered, these medications have been reviewed and there are no changes at this time.  Hgb A1C to be monitored as already arranged by primary service   4. Mixed hyperlipidemia Continue statin as ordered and reviewed, no changes at this time     Hortencia Pilar, MD  11/06/2020 8:20 PM

## 2020-11-06 NOTE — Progress Notes (Signed)
MRN : [id number]  Janet Gross is a 87 y.o. (03/11/1931) female who presents with chief complaint of No chief complaint on file. Marland Kitchen  History of Present Illness:   I am asked to evaluate the patient for the complaint of abdominal pain with uncertain etiology.  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  The patient's son has noted she has had significant weight loss as well as nausea.  The patient does substantiate food fear, particular foods do not seem to aggravate or alleviate the symptoms.  There is no history of bloody bowel movements or diarrhea.    No history of peptic ulcer disease.   No prior peripheral angiograms or vascular interventions.  The patient denies amaurosis fugax or recent TIA symptoms. There are no recent neurological changes noted. The patient denies claudication symptoms or rest pain symptoms. The patient denies history of DVT, PE or superficial thrombophlebitis. 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   No outpatient medications have been marked as taking for the 11/07/20 encounter (Appointment) with Delana Meyer, Dolores Lory, MD.    Past Medical History:  Diagnosis Date  . Anemia   . B12 deficiency 08/03/2016  . Diabetes mellitus without complication ([place])   . Gastric ulcer   . Hypothyroidism   . Onychomycosis 02/04/2013  . Osteoporosis   . Peptic ulcer     Past Surgical History:  Procedure Laterality Date  . cataract left    . COLONOSCOPY    . COLONOSCOPY WITH PROPOFOL N/A 11/13/2016   Procedure: COLONOSCOPY WITH PROPOFOL;  Surgeon: Lollie Sails, MD;  Location[facility] ENDOSCOPY;  Service: Endoscopy;  Laterality: N/A;  . ESOPHAGOGASTRODUODENOSCOPY    .  ESOPHAGOGASTRODUODENOSCOPY (EGD) WITH PROPOFOL N/A 11/13/2016   Procedure: ESOPHAGOGASTRODUODENOSCOPY (EGD) WITH PROPOFOL;  Surgeon: Lollie Sails, MD;  Location: [facility] ENDOSCOPY;  Service: Endoscopy;  Laterality: N/A;  . EXCISION VAGINAL CYST    . neck artery     carotid endarterectomy    Social History Social History   Tobacco Use  . Smoking status: Former Smoker    Packs/day: 0.25    Years: 10.00    Pack years: 2.50    Types: Cigarettes    Quit date: 08/07/1963    Years since quitting: 57.2  . Smokeless tobacco: Never Used  . Tobacco comment: quit over 40 years ago  Vaping Use  . Vaping Use: Never used  Substance Use Topics  . Alcohol use: No  . Drug use: No    Family History No family history of bleeding/clotting disorders, porphyria or autoimmune disease   Allergies  Allergen Reactions  . Sulfa Antibiotics Other (See Comments)    INCREASES HER BLOOD SUGAR , PT STATED  INCREASES HER BLOOD SUGAR , PT STATED  INCREASES HER BLOOD SUGAR , PT STATED      REVIEW OF SYSTEMS (Negative unless checked)  Constitutional: [] Weight loss  [] Fever  [] Chills Cardiac: [] Chest pain   [] Chest pressure   [] Palpitations   [] Shortness of breath when laying flat   [] Shortness of breath with exertion. Vascular:  [] Pain in legs with walking   [] Pain in legs at rest  [] History of DVT   [] Phlebitis   [] Swelling in legs   [] Varicose veins   []   Non-healing ulcers Pulmonary:   [] Uses home oxygen   [] Productive cough   [] Hemoptysis   [] Wheeze  [] COPD   [] Asthma Neurologic:  [] Dizziness   [] Seizures   [] History of stroke   [] History of TIA  [] Aphasia   [] Vissual changes   [] Weakness or numbness in arm   [] Weakness or numbness in leg Musculoskeletal:   [] Joint swelling   [] Joint pain   [] Low back pain Hematologic:  [] Easy bruising  [] Easy bleeding   [] Hypercoagulable state   [] Anemic Gastrointestinal:  [] Diarrhea   [x] Vomiting  [] Gastroesophageal reflux/heartburn   [] Difficulty  swallowing. Genitourinary:  [] Chronic kidney disease   [] Difficult urination  [] Frequent urination   [] Blood in urine Skin:  [] Rashes   [] Ulcers  Psychological:  [] History of anxiety   []  History of major depression.  Physical Examination  There were no vitals filed for this visit. There is no height or weight on file to calculate BMI. Gen: WD/WN, NAD Head: [place]/AT, No temporalis wasting.  Ear/Nose/Throat: Hearing grossly intact, nares w/o erythema or drainage, poor dentition Eyes: PER, EOMI, sclera nonicteric.  Neck: Supple, no masses.  No bruit or JVD.  Pulmonary:  Good air movement, clear to auscultation bilaterally, no use of accessory muscles.  Cardiac: RRR, normal S1, S2, no Murmurs. Vascular:  Vessel Right Left  Radial Palpable Palpable  Brachial Palpable Palpable  Carotid Palpable Palpable  Gastrointestinal: soft, non-distended. No guarding/no peritoneal signs.  Musculoskeletal: M/S 5/5 throughout.  No deformity or atrophy.  Neurologic: CN 2-12 intact. Pain and light touch intact in extremities.  Symmetrical.  Speech is fluent. Motor exam as listed above. Psychiatric: Judgment intact, Mood & affect appropriate for pt's clinical situation. Dermatologic: No rashes or ulcers noted.  No changes consistent with cellulitis.   CBC Lab Results  Component Value Date   WBC 3.7 (L) 10/31/2020   HGB 12.9 10/31/2020   HCT 41.7 10/31/2020   MCV 81.8 10/31/2020   PLT 257 10/31/2020    BMET    Component Value Date/Time   NA 139 10/31/2020 1643   NA 139 08/19/2013 1919   K 3.8 10/31/2020 1643   K 3.8 08/19/2013 1919   CL 104 10/31/2020 1643   CL 106 08/19/2013 1919   CO2 27 10/31/2020 1643   CO2 29 08/19/2013 1919   GLUCOSE 130 (H) 10/31/2020 1643   GLUCOSE 52 (L) 08/19/2013 1919   BUN 10 10/31/2020 1643   BUN 14 08/19/2013 1919   CREATININE 0.82 10/31/2020 1643   CREATININE 0.81 08/19/2013 1919   CALCIUM 9.3 10/31/2020 1643   CALCIUM 9.3 08/19/2013 1919   GFRNONAA >60  10/31/2020 1643   GFRNONAA >60 08/19/2013 1919   GFRAA >60 07/14/2015 0535   GFRAA >60 08/19/2013 1919   Estimated Creatinine Clearance: 45.2 mL/min (by C-G formula based on SCr of 0.82 mg/dL).  COAG No results found for: INR, PROTIME  Radiology CT Head Wo Contrast  Result Date: 10/31/2020 CLINICAL DATA:  Altered mental status. EXAM: CT HEAD WITHOUT CONTRAST TECHNIQUE: Contiguous axial images were obtained from the base of the skull through the vertex without intravenous contrast. COMPARISON:  April 30, 2008. FINDINGS: Brain: Mild chronic ischemic white matter disease is noted. No mass effect or midline shift is noted. Ventricular size is within normal limits. There is no evidence of mass lesion, hemorrhage or acute infarction. Vascular: No hyperdense vessel or unexpected calcification. Skull: Normal. Negative for fracture or focal lesion. Sinuses/Orbits: No acute finding. Other: None. IMPRESSION: Mild chronic ischemic white matter disease. No acute  intracranial abnormality seen. Electronically Signed   By: Marijo Conception M.D.   On: 10/31/2020 18:12   DG Abdomen Acute W/Chest  Result Date: 10/31/2020 CLINICAL DATA:  constipation for 3 days, weak and poor intake. Eval PNA, SBO EXAM: DG ABDOMEN ACUTE WITH 1 VIEW CHEST COMPARISON:  CT 05/26/2013 FINDINGS: Linear subsegmental atelectasis or scarring in the left lower lobe. Lungs are otherwise clear. The heart is normal in size. Aortic atherosclerosis. No pleural fluid or pulmonary edema. No bowel dilatation to suggest obstruction. No abnormal gastric distension. Moderate volume of stool in the ascending, transverse and descending colon. No abnormal rectal distention. Calcified fibroids in the pelvis as seen on prior CT. No radiopaque calculi. No acute osseous abnormalities are seen IMPRESSION: 1. No acute chest findings.  No evidence of pneumonia. 2. Normal bowel gas pattern. Moderate volume of colonic stool. No evidence of obstruction. 3. Uterine  fibroids. Electronically Signed   By: Keith Rake M.D.   On: 10/31/2020 18:23     Assessment/Plan 1. Chronic mesenteric ischemia (HCC) Recommend:  The patient has evidence of severe atherosclerotic changes of the mesenteric arteries associated with weight loss as well as abdominal pain and N/V.  This represents a high risk for bowel infarction and death.  Patient should undergo angiography of the mesenteric arteries with the hope for intervention to eliminate the ischemic symptoms.    The risks and benefits as well as the alternative therapies was discussed in detail with the patient.  All questions were answered.  Patient agrees to proceed with angiography and intervention.  The patient will follow up with me after the angiogram.  2. Essential hypertension Continue antihypertensive medications as already ordered, these medications have been reviewed and there are no changes at this time.   3. Controlled type 2 diabetes mellitus without complication, unspecified whether long term insulin use ([place]) Continue hypoglycemic medications as already ordered, these medications have been reviewed and there are no changes at this time.  Hgb A1C to be monitored as already arranged by primary service   4. Mixed hyperlipidemia Continue statin as ordered and reviewed, no changes at this time     Hortencia Pilar, MD  11/06/2020 8:20 PM

## 2020-11-07 ENCOUNTER — Encounter (INDEPENDENT_AMBULATORY_CARE_PROVIDER_SITE_OTHER): Payer: Self-pay | Admitting: Vascular Surgery

## 2020-11-07 ENCOUNTER — Ambulatory Visit (INDEPENDENT_AMBULATORY_CARE_PROVIDER_SITE_OTHER): Payer: Medicare PPO

## 2020-11-07 ENCOUNTER — Ambulatory Visit (INDEPENDENT_AMBULATORY_CARE_PROVIDER_SITE_OTHER): Payer: Medicare PPO | Admitting: Vascular Surgery

## 2020-11-07 ENCOUNTER — Other Ambulatory Visit: Payer: Self-pay

## 2020-11-07 VITALS — BP 148/73 | HR 55 | Resp 16 | Ht 67.0 in | Wt 130.0 lb

## 2020-11-07 DIAGNOSIS — I10 Essential (primary) hypertension: Secondary | ICD-10-CM | POA: Diagnosis not present

## 2020-11-07 DIAGNOSIS — E119 Type 2 diabetes mellitus without complications: Secondary | ICD-10-CM | POA: Diagnosis not present

## 2020-11-07 DIAGNOSIS — K551 Chronic vascular disorders of intestine: Secondary | ICD-10-CM | POA: Insufficient documentation

## 2020-11-07 DIAGNOSIS — E782 Mixed hyperlipidemia: Secondary | ICD-10-CM

## 2020-11-07 DIAGNOSIS — K559 Vascular disorder of intestine, unspecified: Secondary | ICD-10-CM

## 2020-11-08 ENCOUNTER — Telehealth (INDEPENDENT_AMBULATORY_CARE_PROVIDER_SITE_OTHER): Payer: Self-pay

## 2020-11-08 NOTE — Telephone Encounter (Signed)
Spoke with the patient's son Rolan Bucco and she is scheduled with Dr. Delana Meyer for a SMA stent placement on 11/15/20 with a 2:15 pm arrival time to the MM. Covid testing on 11/11/20 between 8-2 pm at the [facility]. Pre-procedure instructions were discussed and will be mailed.

## 2020-11-11 ENCOUNTER — Other Ambulatory Visit: Payer: Self-pay

## 2020-11-11 ENCOUNTER — Other Ambulatory Visit
Admission: RE | Admit: 2020-11-11 | Discharge: 2020-11-11 | Disposition: A | Discharge status: Home / Self Care | Payer: Medicare PPO | Source: Ambulatory Visit | Attending: Vascular Surgery | Admitting: Vascular Surgery

## 2020-11-11 DIAGNOSIS — Z01812 Encounter for preprocedural laboratory examination: Secondary | ICD-10-CM | POA: Diagnosis present

## 2020-11-11 DIAGNOSIS — Z20822 Contact with and (suspected) exposure to covid-19: Secondary | ICD-10-CM | POA: Insufficient documentation

## 2020-11-11 LAB — SARS CORONAVIRUS 2 (TAT 6-24 HRS): SARS Coronavirus 2: NEGATIVE

## 2020-11-11 NOTE — Telephone Encounter (Signed)
Patient's son Rolan Bucco was given a different arrival time for the patient of 9:00 am on 11/15/20 to the MM.

## 2020-11-14 ENCOUNTER — Other Ambulatory Visit (INDEPENDENT_AMBULATORY_CARE_PROVIDER_SITE_OTHER): Payer: Self-pay | Admitting: Nurse Practitioner

## 2020-11-15 ENCOUNTER — Ambulatory Visit
Admission: RE | Admit: 2020-11-15 | Discharge: 2020-11-15 | Disposition: A | Discharge status: Home / Self Care | Payer: Medicare PPO | Attending: Vascular Surgery | Admitting: Vascular Surgery

## 2020-11-15 ENCOUNTER — Encounter
Admission: RE | Disposition: A | Discharge status: Home / Self Care | Payer: Self-pay | Source: Home / Self Care | Attending: Vascular Surgery

## 2020-11-15 ENCOUNTER — Other Ambulatory Visit: Payer: Self-pay

## 2020-11-15 ENCOUNTER — Encounter: Payer: Self-pay | Admitting: Vascular Surgery

## 2020-11-15 DIAGNOSIS — I10 Essential (primary) hypertension: Secondary | ICD-10-CM | POA: Insufficient documentation

## 2020-11-15 DIAGNOSIS — K551 Chronic vascular disorders of intestine: Secondary | ICD-10-CM | POA: Diagnosis present

## 2020-11-15 DIAGNOSIS — Z8711 Personal history of peptic ulcer disease: Secondary | ICD-10-CM | POA: Insufficient documentation

## 2020-11-15 DIAGNOSIS — E782 Mixed hyperlipidemia: Secondary | ICD-10-CM | POA: Insufficient documentation

## 2020-11-15 DIAGNOSIS — Z87891 Personal history of nicotine dependence: Secondary | ICD-10-CM | POA: Insufficient documentation

## 2020-11-15 DIAGNOSIS — Z882 Allergy status to sulfonamides status: Secondary | ICD-10-CM | POA: Insufficient documentation

## 2020-11-15 DIAGNOSIS — E1136 Type 2 diabetes mellitus with diabetic cataract: Secondary | ICD-10-CM | POA: Insufficient documentation

## 2020-11-15 DIAGNOSIS — K55059 Acute (reversible) ischemia of intestine, part and extent unspecified: Secondary | ICD-10-CM | POA: Diagnosis not present

## 2020-11-15 HISTORY — PX: VISCERAL ANGIOGRAPHY: CATH118276

## 2020-11-15 LAB — GLUCOSE, CAPILLARY
Glucose-Capillary: 97 mg/dL (ref 70–99)
Glucose-Capillary: 107 mg/dL — ABNORMAL HIGH (ref 70–99)

## 2020-11-15 SURGERY — VISCERAL ANGIOGRAPHY
Anesthesia: Moderate Sedation

## 2020-11-15 MED ORDER — METHYLPREDNISOLONE SODIUM SUCC 125 MG IJ SOLR: 125.0000 mg | Freq: Once | INTRAMUSCULAR | Status: DC | PRN

## 2020-11-15 MED ORDER — SODIUM CHLORIDE 0.9 % IV SOLN: INTRAVENOUS | Status: DC

## 2020-11-15 MED ORDER — CEFAZOLIN SODIUM-DEXTROSE 2-4 GM/100ML-% IV SOLN
INTRAVENOUS | Status: AC
  Administered 2020-11-15: 2 g via INTRAVENOUS
  Filled 2020-11-15: qty 100

## 2020-11-15 MED ORDER — MIDAZOLAM HCL 2 MG/2ML IJ SOLN
INTRAMUSCULAR | Status: DC | PRN
  Administered 2020-11-15: 0.5 mg via INTRAVENOUS
  Administered 2020-11-15: 1 mg via INTRAVENOUS

## 2020-11-15 MED ORDER — IODIXANOL 320 MG/ML IV SOLN
INTRAVENOUS | Status: DC | PRN
  Administered 2020-11-15: 65 mL

## 2020-11-15 MED ORDER — MORPHINE SULFATE (PF) 4 MG/ML IV SOLN: 2.0000 mg | INTRAVENOUS | Status: DC | PRN

## 2020-11-15 MED ORDER — CLOPIDOGREL BISULFATE 75 MG PO TABS: 75.0000 mg | ORAL_TABLET | Freq: Every day | ORAL | 3 refills | Status: DC

## 2020-11-15 MED ORDER — MIDAZOLAM HCL 2 MG/ML PO SYRP: 8.0000 mg | ORAL_SOLUTION | Freq: Once | ORAL | Status: DC | PRN

## 2020-11-15 MED ORDER — HYDRALAZINE HCL 20 MG/ML IJ SOLN: 5.0000 mg | INTRAMUSCULAR | Status: DC | PRN

## 2020-11-15 MED ORDER — ONDANSETRON HCL 4 MG/2ML IJ SOLN: 4.0000 mg | Freq: Four times a day (QID) | INTRAMUSCULAR | Status: DC | PRN

## 2020-11-15 MED ORDER — HEPARIN SODIUM (PORCINE) 1000 UNIT/ML IJ SOLN
INTRAMUSCULAR | Status: DC | PRN
  Administered 2020-11-15: 4000 [IU] via INTRAVENOUS

## 2020-11-15 MED ORDER — HYDROMORPHONE HCL 1 MG/ML IJ SOLN: 1.0000 mg | Freq: Once | INTRAMUSCULAR | Status: DC | PRN

## 2020-11-15 MED ORDER — ACETAMINOPHEN 325 MG PO TABS: 650.0000 mg | ORAL_TABLET | ORAL | Status: DC | PRN

## 2020-11-15 MED ORDER — ONDANSETRON HCL 4 MG/2ML IJ SOLN
4.0000 mg | Freq: Four times a day (QID) | INTRAMUSCULAR | Status: DC | PRN
Start: 2020-11-15 — End: 2020-11-15

## 2020-11-15 MED ORDER — HEPARIN SODIUM (PORCINE) 1000 UNIT/ML IJ SOLN
INTRAMUSCULAR | Status: AC
  Filled 2020-11-15: qty 1

## 2020-11-15 MED ORDER — FENTANYL CITRATE (PF) 100 MCG/2ML IJ SOLN
INTRAMUSCULAR | Status: AC
  Filled 2020-11-15: qty 2

## 2020-11-15 MED ORDER — SODIUM CHLORIDE 0.9 % IV SOLN: 250.0000 mL | INTRAVENOUS | Status: DC | PRN

## 2020-11-15 MED ORDER — FENTANYL CITRATE (PF) 100 MCG/2ML IJ SOLN
INTRAMUSCULAR | Status: DC | PRN
  Administered 2020-11-15: 12.5 ug via INTRAVENOUS
  Administered 2020-11-15: 25 ug via INTRAVENOUS

## 2020-11-15 MED ORDER — LABETALOL HCL 5 MG/ML IV SOLN: 10.0000 mg | INTRAVENOUS | Status: DC | PRN

## 2020-11-15 MED ORDER — SODIUM CHLORIDE 0.9% FLUSH: 3.0000 mL | INTRAVENOUS | Status: DC | PRN

## 2020-11-15 MED ORDER — DIPHENHYDRAMINE HCL 50 MG/ML IJ SOLN: 50.0000 mg | Freq: Once | INTRAMUSCULAR | Status: DC | PRN

## 2020-11-15 MED ORDER — FAMOTIDINE 20 MG PO TABS: 40.0000 mg | ORAL_TABLET | Freq: Once | ORAL | Status: DC | PRN

## 2020-11-15 MED ORDER — CEFAZOLIN SODIUM-DEXTROSE 2-4 GM/100ML-% IV SOLN: 2.0000 g | Freq: Once | INTRAVENOUS | Status: AC

## 2020-11-15 MED ORDER — MIDAZOLAM HCL 5 MG/5ML IJ SOLN
INTRAMUSCULAR | Status: AC
  Filled 2020-11-15: qty 5

## 2020-11-15 MED ORDER — ASPIRIN EC 81 MG PO TBEC: 81.0000 mg | DELAYED_RELEASE_TABLET | Freq: Every day | ORAL | 2 refills | Status: DC

## 2020-11-15 MED ORDER — OXYCODONE HCL 5 MG PO TABS: 5.0000 mg | ORAL_TABLET | ORAL | Status: DC | PRN

## 2020-11-15 MED ORDER — SODIUM CHLORIDE 0.9% FLUSH: 3.0000 mL | Freq: Two times a day (BID) | INTRAVENOUS | Status: DC

## 2020-11-15 SURGICAL SUPPLY — 18 items
BALLN MUSTANG 7.0X20 75 (BALLOONS) ×2
BALLOON MUSTANG 7.0X20 75 (BALLOONS) IMPLANT
CANNULA 5F STIFF (CANNULA) ×1 IMPLANT
CATH ANGIO 5F PIGTAIL 65CM (CATHETERS) ×1 IMPLANT
CATH VS15FR (CATHETERS) ×1 IMPLANT
COVER EZ STRL 42X30 (DRAPES) ×1 IMPLANT
COVER PROBE U/S 5X48 (MISCELLANEOUS) ×1 IMPLANT
DEVICE STARCLOSE SE CLOSURE (Vascular Products) ×1 IMPLANT
DEVICE TORQUE (MISCELLANEOUS) ×1 IMPLANT
GLIDEWIRE ANGLED SS 035X260CM (WIRE) ×1 IMPLANT
KIT ENCORE 26 ADVANTAGE (KITS) ×1 IMPLANT
PACK ANGIOGRAPHY (CUSTOM PROCEDURE TRAY) ×2 IMPLANT
SHEATH ANL2 6FRX45 HC (SHEATH) ×1 IMPLANT
SHEATH BRITE TIP 5FRX11 (SHEATH) ×1 IMPLANT
STENT LIFESTREAM 6X26X80 (Permanent Stent) ×1 IMPLANT
TUBING CONTRAST HIGH PRESS 72 (TUBING) ×1 IMPLANT
WIRE GUIDERIGHT .035X150 (WIRE) ×1 IMPLANT
WIRE MAGIC TORQUE 260C (WIRE) ×1 IMPLANT

## 2020-11-15 NOTE — Interval H&P Note (Signed)
History and Physical Interval Note:  11/15/2020 9:25 AM  Janet Gross  has presented today for surgery, with the diagnosis of SMA Stent Placement    Chronic mesenteric ischemia Covid April 8.  The various methods of treatment have been discussed with the patient and family. After consideration of risks, benefits and other options for treatment, the patient has consented to  Procedure(s): VISCERAL ANGIOGRAPHY (N/A) as a surgical intervention.  The patient's history has been reviewed, patient examined, no change in status, stable for surgery.  I have reviewed the patient's chart and labs.  Questions were answered to the patient's satisfaction.     Hortencia Pilar

## 2020-11-15 NOTE — Progress Notes (Addendum)
Patient tolerating meal tray well without difficulty. Femoral site is clean dry and intact. Patient's son remains at bedside.

## 2020-11-15 NOTE — Discharge Instructions (Signed)
Femoral Site Care  This sheet gives you information about how to care for yourself after your procedure. Your health care provider may also give you more specific instructions. If you have problems or questions, contact your health care provider. What can I expect after the procedure? After the procedure, it is common to have:  Bruising that usually fades within 1-2 weeks.  Tenderness at the site. Follow these instructions at home: Wound care  Follow instructions from your health care provider about how to take care of your insertion site. Make sure you: ? Wash your hands with soap and water before you change your bandage (dressing). If soap and water are not available, use hand sanitizer. ? Change your dressing as told by your health care provider. ? Leave stitches (sutures), skin glue, or adhesive strips in place. These skin closures may need to stay in place for 2 weeks or longer. If adhesive strip edges start to loosen and curl up, you may trim the loose edges. Do not remove adhesive strips completely unless your health care provider tells you to do that.  Do not take baths, swim, or use a hot tub until your health care provider approves.  You may shower 24-48 hours after the procedure or as told by your health care provider. ? Gently wash the site with plain soap and water. ? Pat the area dry with a clean towel. ? Do not rub the site. This may cause bleeding.  Do not apply powder or lotion to the site. Keep the site clean and dry.  Check your femoral site every day for signs of infection. Check for: ? Redness, swelling, or pain. ? Fluid or blood. ? Warmth. ? Pus or a bad smell. Activity  For the first 2-3 days after your procedure, or as long as directed: ? Avoid climbing stairs as much as possible. ? Do not squat.  Do not lift anything that is heavier than 10 lb (4.5 kg), or the limit that you are told, until your health care provider says that it is safe.  Rest as  directed. ? Avoid sitting for a long time without moving. Get up to take short walks every 1-2 hours.  Do not drive for 24 hours if you were given a medicine to help you relax (sedative). General instructions  Take over-the-counter and prescription medicines only as told by your health care provider.  Keep all follow-up visits as told by your health care provider. This is important. Contact a health care provider if you have:  A fever or chills.  You have redness, swelling, or pain around your insertion site. Get help right away if:  The catheter insertion area swells very fast.  You pass out.  You suddenly start to sweat or your skin gets clammy.  The catheter insertion area is bleeding, and the bleeding does not stop when you hold steady pressure on the area.  The area near or just beyond the catheter insertion site becomes pale, cool, tingly, or numb. These symptoms may represent a serious problem that is an emergency. Do not wait to see if the symptoms will go away. Get medical help right away. Call your local emergency services (911 in the [place] Do not drive yourself to the hospital. Summary  After the procedure, it is common to have bruising that usually fades within 1-2 weeks.  Check your femoral site every day for signs of infection.  Do not lift anything that is heavier than 10 lb (4.5 kg), or   the limit that you are told, until your health care provider says that it is safe. This information is not intended to replace advice given to you by your health care provider. Make sure you discuss any questions you have with your health care provider. Document Revised: 03/25/2020 Document Reviewed: 03/25/2020 Elsevier Patient Education  2021 Elsevier Inc.  

## 2020-11-15 NOTE — Op Note (Signed)
[facility] Percutaneous Study/Intervention Procedural Note   Date: 11/15/2020  Surgeon(s): Hortencia Pilar, MD  Assistants: none  Pre-operative Diagnosis: 1.  Chronic mesenteric ischemia 2.  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   Post-operative diagnosis: Same  Procedure(s) Performed: 1. Ultrasound guidance for vascular access right femoral artery femoral artery 2. Catheter placement into the SMA from right femoral approach 3. Aortogram and selective angiogram of the SMA 4. Stent to the SMA with 6 mm mm diameter x 26 mm length lifestream balloon expandable stent postdilated to 7 mm 5. StarClose closure device right femoral artery  Contrast: 65 cc  Fluoro time: 4.4 minutes  EBL: 5 cc  Anesthesia: Continuous ECG pulse oximetry and cardiopulmonary monitoring was performed throughout the entire procedure by the interventional radiology nurse total sedation time was 31 minutes and 13 seconds.   Indications: Patient is a 87 y.o. female who has symptoms consistent with mesenteric ischemia. The patient has a mesenteric duplex showing critical stenosis of the superior mesenteric artery. The patient is brought in for angiography for further evaluation and potential treatment. Risks and benefits are discussed and informed consent is obtained  Procedure: The patient was identified and appropriate procedural time out was performed. The patient was then placed supine on the table and prepped and draped in the usual sterile fashion.Moderate conscious sedation was administered during a face to face encounter with the patient throughout the procedure with my supervision of the RN administering medicines and monitoring the patient's vital signs, pulse oximetry, telemetry and mental status throughout from the start of the procedure until the patient was taken to the recovery  room.  Ultrasound was used to evaluate the right common femoral artery. It was patent . A digital ultrasound image was acquired. A Seldinger needle was used to access the right common femoral artery under direct ultrasound guidance and a permanent image was performed. A 0.035 J wire was advanced without resistance and a 5Fr sheath was placed. Pigtail catheter was placed into the aorta and an AP aortogram was performed. We transitioned to the lateral projection to image the celiac and SMA.  Diagnostic interpretation: Abdominal aorta is opacified with contrast.  Is diffusely calcified but there are no hemodynamically significant lesions noted.  In the lateral projection the celiac artery is poorly visualized however the SMA is well visualized and demonstrates a greater than 80% stenosis at its origin extending 1 to 2 cm distally from the origin.  Otherwise the superior mesenteric artery appears to be widely patent in both lateral and AP views.  he patient was given 4000 units of IV heparin.  We upsized to a 6 Fr sheath.  A VS 1 catheter was used to selectively cannulate the SMA. Based on her symptoms and these findings, I elected to treat the SMA to try to improve the patient's clinical course. I crossed the lesion without difficulty with a stiff angled Glidewire in combination with the VS 1 catheter and then advanced a 6 [place] Ansell sheath so that the tip of the sheath was positioned distal to the lesion. I then used a 6 mm diameter x 26 mm length balloon expandable lifestream stent to perform treatment of the SMA. I inflated the balloon to 10 atm.  Follow-up imaging demonstrated the stent was slightly undersized proximally and I advanced a 7 mm x 20 mm Mustang balloon across the proximal half of the stent and performed a second inflation to 10 atm for approximately 30 seconds.  On completion  angiogram following this, less than 10%  residual stenosis was identified. At this point, I elected to  terminate the procedure. The diagnostic catheter was removed. StarClose closure device was deployed in usual fashion with excellent hemostatic result. The patient was taken to the recovery room in stable condition having tolerated the procedure well.     Findings: Abdominal aorta is opacified with contrast.  Is diffusely calcified but there are no hemodynamically significant lesions noted.  In the lateral projection the celiac artery is poorly visualized however the SMA is well visualized and demonstrates a greater than 80% stenosis at its origin extending 1 to 2 cm distally from the origin.  Otherwise the superior mesenteric artery appears to be widely patent in both lateral and AP views.  On completion angiogram following this, less than 10%  residual stenosis was identified.  Disposition: Patient was taken to the recovery room in stable condition having tolerated the procedure well.  Complications: None  Hortencia Pilar 11/15/2020 11:11 AM   This note was created with Dragon Medical transcription system. Any errors in dictation are purely unintentional.

## 2020-11-16 ENCOUNTER — Encounter: Payer: Self-pay | Admitting: Vascular Surgery

## 2020-12-06 ENCOUNTER — Other Ambulatory Visit (INDEPENDENT_AMBULATORY_CARE_PROVIDER_SITE_OTHER): Payer: Self-pay | Admitting: Vascular Surgery

## 2020-12-06 DIAGNOSIS — K551 Chronic vascular disorders of intestine: Secondary | ICD-10-CM

## 2020-12-06 DIAGNOSIS — Z95820 Peripheral vascular angioplasty status with implants and grafts: Secondary | ICD-10-CM

## 2020-12-07 ENCOUNTER — Ambulatory Visit (INDEPENDENT_AMBULATORY_CARE_PROVIDER_SITE_OTHER): Payer: Medicare PPO | Admitting: Nurse Practitioner

## 2020-12-07 ENCOUNTER — Ambulatory Visit (INDEPENDENT_AMBULATORY_CARE_PROVIDER_SITE_OTHER): Payer: Medicare PPO

## 2020-12-07 ENCOUNTER — Other Ambulatory Visit: Payer: Self-pay

## 2020-12-07 ENCOUNTER — Encounter (INDEPENDENT_AMBULATORY_CARE_PROVIDER_SITE_OTHER): Payer: Self-pay | Admitting: Nurse Practitioner

## 2020-12-07 VITALS — BP 109/63 | HR 60 | Ht 67.0 in | Wt 124.0 lb

## 2020-12-07 DIAGNOSIS — I10 Essential (primary) hypertension: Secondary | ICD-10-CM

## 2020-12-07 DIAGNOSIS — Z95820 Peripheral vascular angioplasty status with implants and grafts: Secondary | ICD-10-CM | POA: Diagnosis not present

## 2020-12-07 DIAGNOSIS — K551 Chronic vascular disorders of intestine: Secondary | ICD-10-CM

## 2020-12-07 DIAGNOSIS — E782 Mixed hyperlipidemia: Secondary | ICD-10-CM | POA: Diagnosis not present

## 2020-12-07 NOTE — Progress Notes (Signed)
Subjective:    Patient ID: Janet Gross, female    DOB: [birth year], 87 y.o.   MRN: [id number] Chief Complaint  Patient presents with  . 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  The patient denies abdominal pain or postprandial symptoms.  The patient has had some weight loss and decreased appetite but this is not related to food phobia.  The patient's son notes that she just generally has decreased appetite.  The patient does not substantiate food fear, particular foods do not seem to aggravate or alleviate the symptoms.  The patient denies bloody bowel movements or diarrhea.  The patient has a history of colonoscopy which was not diagnostic.  No history of peptic ulcer disease.    On 11/15/2020 the patient underwent angiogram of the SMA with stent placement to the SMA.  The patient has tolerated the procedure well.  The patient denies amaurosis fugax or recent TIA symptoms. There are no recent neurological changes noted. The patient denies claudication symptoms or rest pain symptoms. The patient denies history of DVT, PE or superficial thrombophlebitis. The patient denies recent episodes of angina    Noninvasive studies show decrease in the SMA and celiac velocities are decreased compared to prior to intervention.  No significant stenosis noted with a patent stent.   Review of Systems  Gastrointestinal: Negative for abdominal pain.  Psychiatric/Behavioral: Positive for confusion.  All other systems reviewed and are negative.      Objective:   Physical Exam Vitals reviewed.  HENT:     Head: Normocephalic.  Cardiovascular:     Rate and Rhythm: Normal rate and regular rhythm.     Pulses: Normal pulses.  Pulmonary:     Effort: Pulmonary effort is normal.  Abdominal:     General: Abdomen is flat. Bowel sounds  are normal.     Palpations: Abdomen is soft.  Neurological:     Mental Status: She is alert and oriented to person, place, and time. Mental status is at baseline.  Psychiatric:        Mood and Affect: Mood normal.        Behavior: Behavior normal.        Thought Content: Thought content normal.        Cognition and Memory: Cognition is impaired. Memory is impaired.        Judgment: Judgment normal.     BP 109/63   Pulse 60   Ht 5\' 7"  (1.702 m)   Wt 124 lb (56.2 kg)   BMI 19.42 kg/m   Past Medical History:  Diagnosis Date  . Anemia   . B12 deficiency 08/03/2016  . Diabetes mellitus without complication ([place])   . Gastric ulcer   . Hypothyroidism   . Onychomycosis 02/04/2013  . Osteoporosis   . Peptic ulcer     Social History   Socioeconomic History  . Marital status: Widowed    Spouse name: Not on file  . Number of children: 1  . Years of education: Not on file  . Highest education level: Not on file  Occupational History  . Occupation: retired    Comment: Pharmacist, hospital  Tobacco Use  . Smoking status: Former Smoker    Packs/day: 0.25    Years: 10.00    Pack years: 2.50    Types: Cigarettes  Quit date: 08/07/1963    Years since quitting: 57.3  . Smokeless tobacco: Never Used  . Tobacco comment: quit over 40 years ago  Vaping Use  . Vaping Use: Never used  Substance and Sexual Activity  . Alcohol use: No  . Drug use: No  . Sexual activity: Not on file  Other Topics Concern  . Not on file  Social History Narrative   Lives by herself : 2 caregivers M-F; son cares for her on weekends    Social Determinants of Health   Financial Resource Strain: Not on file  Food Insecurity: Not on file  Transportation Needs: Not on file  Physical Activity: Not on file  Stress: Not on file  Social Connections: Not on file  Intimate Partner Violence: Not on file    Past Surgical History:  Procedure Laterality Date  . cataract left    . COLONOSCOPY    . COLONOSCOPY WITH  PROPOFOL N/A 11/13/2016   Procedure: COLONOSCOPY WITH PROPOFOL;  Surgeon: Lollie Sails, MD;  Location: [facility] ENDOSCOPY;  Service: Endoscopy;  Laterality: N/A;  . ESOPHAGOGASTRODUODENOSCOPY    . ESOPHAGOGASTRODUODENOSCOPY (EGD) WITH PROPOFOL N/A 11/13/2016   Procedure: ESOPHAGOGASTRODUODENOSCOPY (EGD) WITH PROPOFOL;  Surgeon: Lollie Sails, MD;  Location: [facility] ENDOSCOPY;  Service: Endoscopy;  Laterality: N/A;  . EXCISION VAGINAL CYST    . neck artery     carotid endarterectomy  . VISCERAL ANGIOGRAPHY N/A 11/15/2020   Procedure: VISCERAL ANGIOGRAPHY;  Surgeon: Katha Cabal, MD;  Location: [place] CV LAB;  Service: Cardiovascular;  Laterality: N/A;    Family History  Problem Relation Age of Onset  . Heart attack Mother   . Emphysema Father   . Diabetes Sister   . Diabetes Brother     Allergies  Allergen Reactions  . Sulfa Antibiotics Other (See Comments)    INCREASES HER BLOOD SUGAR , PT STATED     CBC Latest Ref Rng & Units 10/31/2020 08/20/2017 02/05/2017  WBC 4.0 - 10.5 K/uL 3.7(L) 2.9(L) 3.1(L)  Hemoglobin 12.0 - 15.0 g/dL 12.9 12.2 12.2  Hematocrit 36.0 - 46.0 % 41.7 37.9 37.3  Platelets 150 - 400 K/uL 257 209 210      CMP     Component Value Date/Time   NA 139 10/31/2020 1643   NA 139 08/19/2013 1919   K 3.8 10/31/2020 1643   K 3.8 08/19/2013 1919   CL 104 10/31/2020 1643   CL 106 08/19/2013 1919   CO2 27 10/31/2020 1643   CO2 29 08/19/2013 1919   GLUCOSE 130 (H) 10/31/2020 1643   GLUCOSE 52 (L) 08/19/2013 1919   BUN 10 10/31/2020 1643   BUN 14 08/19/2013 1919   CREATININE 0.82 10/31/2020 1643   CREATININE 0.81 08/19/2013 1919   CALCIUM 9.3 10/31/2020 1643   CALCIUM 9.3 08/19/2013 1919   PROT 7.1 10/31/2020 1643   ALBUMIN 4.0 10/31/2020 1643   AST 17 10/31/2020 1643   ALT 13 10/31/2020 1643   ALKPHOS 60 10/31/2020 1643   BILITOT 0.8 10/31/2020 1643   GFRNONAA >60 10/31/2020 1643   GFRNONAA >60 08/19/2013 1919   GFRAA >60 07/14/2015 0535    GFRAA >60 08/19/2013 1919     No results found.     Assessment & Plan:   1. Chronic mesenteric ischemia (HCC) Recommend:  The patient is status post successful angiogram with intervention of the mesenteric vessels.  Stent placement to the SMA was performed.  The patient reports that the abdominal pain is improved and  the post prandial symptoms are essentially gone.   The patient denies lifestyle limiting changes at this point in time.  No further invasive studies, angiography or surgery at this time The patient should continue walking and begin a more formal exercise program.  The patient should continue antiplatelet therapy and aggressive treatment of the lipid abnormalities   Patient should undergo noninvasive studies as ordered. The patient will follow up with me after the studies.   The patient will follow-up in 3 months with noninvasive studies 2. Essential hypertension Continue antihypertensive medications as already ordered, these medications have been reviewed and there are no changes at this time.   3. Mixed hyperlipidemia Continue statin as ordered and reviewed, no changes at this time    Current Outpatient Medications on File Prior to Visit  Medication Sig Dispense Refill  . aspirin EC 81 MG tablet Take 1 tablet (81 mg total) by mouth daily. 150 tablet 2  . atorvastatin (LIPITOR) 20 MG tablet Take 20 mg by mouth daily.    . clopidogrel (PLAVIX) 75 MG tablet Take 1 tablet (75 mg total) by mouth daily. 30 tablet 3  . donepezil (ARICEPT) 5 MG tablet Take 5 mg by mouth at bedtime.    . gabapentin (NEURONTIN) 300 MG capsule Take 300 mg by mouth at bedtime.     Marland Kitchen glimepiride (AMARYL) 1 MG tablet Take 1 mg by mouth daily with breakfast.    . levothyroxine (SYNTHROID) 100 MCG tablet 100 mcg daily before breakfast.    . losartan (COZAAR) 25 MG tablet Take 25 mg by mouth daily.    . metFORMIN (GLUCOPHAGE) 1000 MG tablet Take 500 mg by mouth 2 (two) times daily with a meal.     . vitamin B-12 (CYANOCOBALAMIN) 1000 MCG tablet Take 1,000 mcg by mouth daily.     Current Facility-Administered Medications on File Prior to Visit  Medication Dose Route Frequency Provider Last Rate Last Admin  . cyanocobalamin ((VITAMIN B-12)) injection 1,000 mcg  1,000 mcg Intramuscular Once Sindy Guadeloupe, MD        There are no Patient Instructions on file for this visit. No follow-ups on file.   Kris Hartmann, NP

## 2021-02-10 ENCOUNTER — Other Ambulatory Visit (INDEPENDENT_AMBULATORY_CARE_PROVIDER_SITE_OTHER): Payer: Self-pay | Admitting: Vascular Surgery

## 2021-03-04 ENCOUNTER — Encounter: Payer: Self-pay | Admitting: Emergency Medicine

## 2021-03-04 ENCOUNTER — Other Ambulatory Visit: Payer: Self-pay

## 2021-03-04 ENCOUNTER — Emergency Department: Payer: Medicare Other

## 2021-03-04 ENCOUNTER — Inpatient Hospital Stay
Admission: EM | Admit: 2021-03-04 | Discharge: 2021-03-07 | DRG: 812 | Disposition: A | Discharge status: Home / Self Care | Payer: Medicare Other | Attending: Internal Medicine | Admitting: Internal Medicine

## 2021-03-04 DIAGNOSIS — I10 Essential (primary) hypertension: Secondary | ICD-10-CM | POA: Diagnosis not present

## 2021-03-04 DIAGNOSIS — Z8249 Family history of ischemic heart disease and other diseases of the circulatory system: Secondary | ICD-10-CM

## 2021-03-04 DIAGNOSIS — Z8711 Personal history of peptic ulcer disease: Secondary | ICD-10-CM | POA: Diagnosis not present

## 2021-03-04 DIAGNOSIS — D649 Anemia, unspecified: Secondary | ICD-10-CM

## 2021-03-04 DIAGNOSIS — Z833 Family history of diabetes mellitus: Secondary | ICD-10-CM | POA: Diagnosis not present

## 2021-03-04 DIAGNOSIS — R4781 Slurred speech: Secondary | ICD-10-CM | POA: Diagnosis present

## 2021-03-04 DIAGNOSIS — Z87891 Personal history of nicotine dependence: Secondary | ICD-10-CM | POA: Diagnosis not present

## 2021-03-04 DIAGNOSIS — D62 Acute posthemorrhagic anemia: Secondary | ICD-10-CM | POA: Diagnosis not present

## 2021-03-04 DIAGNOSIS — Z79899 Other long term (current) drug therapy: Secondary | ICD-10-CM | POA: Diagnosis not present

## 2021-03-04 DIAGNOSIS — F0280 Dementia in other diseases classified elsewhere without behavioral disturbance: Secondary | ICD-10-CM | POA: Diagnosis not present

## 2021-03-04 DIAGNOSIS — E119 Type 2 diabetes mellitus without complications: Secondary | ICD-10-CM | POA: Diagnosis not present

## 2021-03-04 DIAGNOSIS — Z7902 Long term (current) use of antithrombotics/antiplatelets: Secondary | ICD-10-CM

## 2021-03-04 DIAGNOSIS — Z7984 Long term (current) use of oral hypoglycemic drugs: Secondary | ICD-10-CM | POA: Diagnosis not present

## 2021-03-04 DIAGNOSIS — K551 Chronic vascular disorders of intestine: Secondary | ICD-10-CM | POA: Diagnosis present

## 2021-03-04 DIAGNOSIS — Z79890 Hormone replacement therapy (postmenopausal): Secondary | ICD-10-CM | POA: Diagnosis not present

## 2021-03-04 DIAGNOSIS — G301 Alzheimer's disease with late onset: Secondary | ICD-10-CM | POA: Diagnosis not present

## 2021-03-04 DIAGNOSIS — E039 Hypothyroidism, unspecified: Secondary | ICD-10-CM | POA: Diagnosis present

## 2021-03-04 DIAGNOSIS — E440 Moderate protein-calorie malnutrition: Secondary | ICD-10-CM | POA: Diagnosis not present

## 2021-03-04 DIAGNOSIS — Z20822 Contact with and (suspected) exposure to covid-19: Secondary | ICD-10-CM | POA: Diagnosis present

## 2021-03-04 DIAGNOSIS — D509 Iron deficiency anemia, unspecified: Secondary | ICD-10-CM | POA: Diagnosis not present

## 2021-03-04 DIAGNOSIS — Z882 Allergy status to sulfonamides status: Secondary | ICD-10-CM | POA: Diagnosis not present

## 2021-03-04 DIAGNOSIS — M810 Age-related osteoporosis without current pathological fracture: Secondary | ICD-10-CM | POA: Diagnosis not present

## 2021-03-04 DIAGNOSIS — K922 Gastrointestinal hemorrhage, unspecified: Secondary | ICD-10-CM

## 2021-03-04 DIAGNOSIS — Z7982 Long term (current) use of aspirin: Secondary | ICD-10-CM | POA: Diagnosis not present

## 2021-03-04 LAB — COMPREHENSIVE METABOLIC PANEL
ALT: 14 U/L (ref 0–44)
AST: 17 U/L (ref 15–41)
Albumin: 3.7 g/dL (ref 3.5–5.0)
Alkaline Phosphatase: 56 U/L (ref 38–126)
Anion gap: 7 (ref 5–15)
BUN: 12 mg/dL (ref 8–23)
CO2: 22 mmol/L (ref 22–32)
Calcium: 8.8 mg/dL — ABNORMAL LOW (ref 8.9–10.3)
Chloride: 110 mmol/L (ref 98–111)
Creatinine, Ser: 0.83 mg/dL (ref 0.44–1.00)
GFR, Estimated: >60 mL/min (ref >60)
Glucose, Bld: 154 mg/dL — ABNORMAL HIGH (ref 70–99)
Potassium: 3.6 mmol/L (ref 3.5–5.1)
Sodium: 139 mmol/L (ref 135–145)
Total Bilirubin: 0.4 mg/dL (ref 0.3–1.2)
Total Protein: 6.5 g/dL (ref 6.5–8.1)

## 2021-03-04 LAB — BRAIN NATRIURETIC PEPTIDE: B Natriuretic Peptide: 73.2 pg/mL (ref 0.0–100.0)

## 2021-03-04 LAB — IRON AND TIBC
Iron: 107 ug/dL (ref 28–170)
Saturation Ratios: 23 % (ref 10.4–31.8)
TIBC: 456 ug/dL — ABNORMAL HIGH (ref 250–450)
UIBC: 349 ug/dL

## 2021-03-04 LAB — PROCALCITONIN: Procalcitonin: <0.1 ng/mL

## 2021-03-04 LAB — CBC WITH DIFFERENTIAL/PLATELET
Abs Immature Granulocytes: 0.02 10*3/uL (ref 0.00–0.07)
Basophils Absolute: 0 10*3/uL (ref 0.0–0.1)
Basophils Relative: 0 %
Eosinophils Absolute: 0 10*3/uL (ref 0.0–0.5)
Eosinophils Relative: 1 %
HCT: 14 % — CL (ref 36.0–46.0)
Hemoglobin: 3.9 g/dL — CL (ref 12.0–15.0)
Immature Granulocytes: 0 %
Lymphocytes Relative: 26 %
Lymphs Abs: 1.2 10*3/uL (ref 0.7–4.0)
MCH: 18.3 pg — ABNORMAL LOW (ref 26.0–34.0)
MCHC: 27.9 g/dL — ABNORMAL LOW (ref 30.0–36.0)
MCV: 65.7 fL — ABNORMAL LOW (ref 80.0–100.0)
Monocytes Absolute: 0.5 10*3/uL (ref 0.1–1.0)
Monocytes Relative: 11 %
Neutro Abs: 2.9 10*3/uL (ref 1.7–7.7)
Neutrophils Relative %: 62 %
Platelets: 368 10*3/uL (ref 150–400)
RBC: 2.13 MIL/uL — ABNORMAL LOW (ref 3.87–5.11)
RDW: 21.9 % — ABNORMAL HIGH (ref 11.5–15.5)
Smear Review: NORMAL
WBC: 4.7 10*3/uL (ref 4.0–10.5)
nRBC: 0.6 % — ABNORMAL HIGH (ref 0.0–0.2)

## 2021-03-04 LAB — RESP PANEL BY RT-PCR (FLU A&B, COVID) ARPGX2
Influenza A by PCR: NEGATIVE
Influenza B by PCR: NEGATIVE
SARS Coronavirus 2 by RT PCR: NEGATIVE

## 2021-03-04 LAB — ABO/RH: ABO/RH(D): O POS

## 2021-03-04 LAB — MAGNESIUM: Magnesium: 2 mg/dL (ref 1.7–2.4)

## 2021-03-04 LAB — TROPONIN I (HIGH SENSITIVITY): Troponin I (High Sensitivity): 7 ng/L (ref <18)

## 2021-03-04 MED ORDER — PANTOPRAZOLE SODIUM 40 MG IV SOLR
40.0000 mg | Freq: Once | INTRAVENOUS | Status: AC
  Administered 2021-03-04: 40 mg via INTRAVENOUS
  Filled 2021-03-04: qty 40

## 2021-03-04 MED ORDER — IOHEXOL 350 MG/ML SOLN
100.0000 mL | Freq: Once | INTRAVENOUS | Status: AC | PRN
  Administered 2021-03-04: 100 mL via INTRAVENOUS

## 2021-03-04 MED ORDER — SODIUM CHLORIDE 0.9 % IV SOLN: 10.0000 mL/h | Freq: Once | INTRAVENOUS | Status: DC

## 2021-03-04 NOTE — ED Triage Notes (Signed)
Pt to ED via POV with son, pt's son reports hx of dementia. Reports this morning had episode of slurred speech that resolved, happened again approx 45 mins ago and resolved. Pt's son reports increasing fatigue. X several weeks. Pt reports mentation at baseline. Pt with noted severely pale mucous membranes in triage. Pt with noted dyspnea with exertion.

## 2021-03-04 NOTE — ED Provider Notes (Signed)
[facility] Emergency Department Provider Note  ____________________________________________   Event Date/Time   First MD Initiated Contact with Patient 03/04/21 2030     (approximate)  I have reviewed the triage vital signs and the nursing notes.   HISTORY  Chief Complaint Shortness of Breath and Fatigue   HPI Janet Gross is a 87 y.o. female with a past medical history of anemia, B12 deficiency, DM, gastric ulcer, hypothyroidism, osteoporosis and dementia not typically oriented at baseline who presents accompanied by her son for assessment of 2 episodes of difficulty with her speech today as well as some progressive dyspnea on exertion.  Patient's son states they are emergency room because this morning around breakfast he noticed for about couple minutes that the speech seemed slurred.  This seemed to resolve after 3 to 4 minutes.  This happened again shortly prior to arrival to emergency room.  This also resolved after a few minutes.  Patient does not recall this and denies any acute complaints including headache, chest pain, shortness of breath, cough, fevers vomiting, diarrhea, urinary symptoms or focal weakness numbness or decree sensation.  No reported history of blood in the stool         Past Medical History:  Diagnosis Date   Anemia    B12 deficiency 08/03/2016   Diabetes mellitus without complication ([place])    Gastric ulcer    Hypothyroidism    Onychomycosis 02/04/2013   Osteoporosis    Peptic ulcer     Patient Active Problem List   Diagnosis Date Noted   Symptomatic anemia 03/04/2021   Slurred speech 03/04/2021   Chronic mesenteric ischemia ([place]) 11/07/2020   Essential hypertension 03/17/2019   Pain due to onychomycosis of toenails of both feet 01/29/2019   Diabetic neuropathy ([place]) 01/29/2019   Late onset Alzheimer's disease without behavioral disturbance ([place]) 12/31/2017   B12 deficiency 08/03/2016   Iron deficiency anemia  06/27/2016   Hypoglycemia 07/13/2015   Controlled type 2 diabetes mellitus without complication ([place]) [id number]   Gastric ulcer 02/22/2014   HLD (hyperlipidemia) 02/22/2014   Adult hypothyroidism 02/22/2014   Adiposity 02/22/2014   Osteoporosis, 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   Onychomycosis     Past Surgical History:  Procedure Laterality Date   cataract left     COLONOSCOPY     COLONOSCOPY WITH PROPOFOL N/A 11/13/2016   Procedure: COLONOSCOPY WITH PROPOFOL;  Surgeon: Lollie Sails, MD;  Location: [facility] ENDOSCOPY;  Service: Endoscopy;  Laterality: N/A;   ESOPHAGOGASTRODUODENOSCOPY     ESOPHAGOGASTRODUODENOSCOPY (EGD) WITH PROPOFOL N/A 11/13/2016   Procedure: ESOPHAGOGASTRODUODENOSCOPY (EGD) WITH PROPOFOL;  Surgeon: Lollie Sails, MD;  Location: [facility] ENDOSCOPY;  Service: Endoscopy;  Laterality: N/A;   EXCISION VAGINAL CYST     neck artery     carotid endarterectomy   VISCERAL ANGIOGRAPHY N/A 11/15/2020   Procedure: VISCERAL ANGIOGRAPHY;  Surgeon: Katha Cabal, MD;  Location: [facility];  Service: Cardiovascular;  Laterality: N/A;    Prior to Admission medications   Medication Sig Start Date End Date Taking? Authorizing Provider  aspirin EC 81 MG tablet Take 1 tablet (81 mg total) by mouth daily. 11/16/20  Yes Schnier, Dolores Lory, MD  atorvastatin (LIPITOR) 20 MG tablet Take 20 mg by mouth daily.   Yes [provider]  clopidogrel (PLAVIX) 75 MG tablet TAKE 1 TABLET BY MOUTH EVERY DAY 02/10/21  Yes Schnier, Dolores Lory, MD  donepezil (ARICEPT) 5 MG tablet Take 5 mg by mouth at bedtime. 04/25/19  Yes  [provider]  gabapentin (NEURONTIN) 300 MG capsule Take 300 mg by mouth at bedtime.    Yes [provider]  glimepiride (AMARYL) 1 MG tablet Take 1 mg by mouth daily with breakfast. 12/18/18  Yes [provider]  levothyroxine (SYNTHROID) 100 MCG tablet 100 mcg daily before breakfast. 11/17/19  Yes [provider]  losartan  (COZAAR) 25 MG tablet Take 25 mg by mouth daily.   Yes [provider]  memantine (NAMENDA) 10 MG tablet Take 10 mg by mouth 2 (two) times daily. 12/19/20  Yes [provider]  metFORMIN (GLUCOPHAGE) 1000 MG tablet Take 500 mg by mouth 2 (two) times daily with a meal.   Yes [provider]  vitamin B-12 (CYANOCOBALAMIN) 1000 MCG tablet Take 1,000 mcg by mouth daily.   Yes [provider]    Allergies Sulfa antibiotics  Family History  Problem Relation Age of Onset   Heart attack Mother    Emphysema Father    Diabetes Sister    Diabetes Brother     Social History Social History   Tobacco Use   Smoking status: Former    Packs/day: 0.25    Years: 10.00    Pack years: 2.50    Types: Cigarettes    Quit date: 08/07/1963    Years since quitting: 57.6   Smokeless tobacco: Never   Tobacco comments:    quit over 40 years ago  Vaping Use   Vaping Use: Never used  Substance Use Topics   Alcohol use: No   Drug use: No    Review of Systems  Review of Systems  Unable to perform ROS: Dementia  Respiratory:  Positive for shortness of breath (w/ exertion).   Neurological:  Positive for speech change (x2 slurred and since resolved).     ____________________________________________   PHYSICAL EXAM:  VITAL SIGNS: ED Triage Vitals  Enc Vitals Group     BP 03/04/21 2019 (!) 146/46     Pulse Rate 03/04/21 2019 79     Resp 03/04/21 2019 20     Temp 03/04/21 2019 98.4 F (36.9 C)     Temp Source 03/04/21 2019 Oral     SpO2 03/04/21 2019 100 %     Weight 03/04/21 2016 133 lb (60.3 kg)     Height 03/04/21 2016 '5\' 7"'$  (1.702 m)     Head Circumference --      Peak Flow --      Pain Score 03/04/21 2016 0     Pain Loc --      Pain Edu? --      Excl. in [place]? --    Vitals:   03/04/21 2154 03/04/21 2316  BP: (!) 126/92 (!) 122/48  Pulse: 79 76  Resp: 19 19  Temp:  98.2 F (36.8 C)  SpO2: 100%    Physical Exam Vitals and nursing note  reviewed.  Constitutional:      General: She is not in acute distress.    Appearance: She is well-developed.  HENT:     Head: Normocephalic and atraumatic.     Right Ear: External ear normal.     Left Ear: External ear normal.     Nose: Nose normal.  Eyes:     Conjunctiva/sclera: Conjunctivae normal.  Cardiovascular:     Rate and Rhythm: Normal rate and regular rhythm.     Pulses: Normal pulses.     Heart sounds: No murmur heard. Pulmonary:     Effort: Pulmonary  effort is normal. No respiratory distress.     Breath sounds: Normal breath sounds.  Abdominal:     Palpations: Abdomen is soft.     Tenderness: There is no abdominal tenderness. There is no right CVA tenderness or left CVA tenderness.  Musculoskeletal:     Cervical back: Neck supple.     Right lower leg: No edema.     Left lower leg: No edema.  Skin:    General: Skin is warm and dry.  Neurological:     Mental Status: She is alert. Mental status is at baseline. She is disoriented.  Psychiatric:        Mood and Affect: Mood normal.    Cranial nerves II through XII grossly intact.  No pronator drift.  No finger dysmetria.  Symmetric 5/5 strength of all extremities.  Sensation intact to light touch in all extremities.    On rectal exam patient has brown but Hemoccult positive stool.  No active bleeding noted. ____________________________________________   LABS (all labs ordered are listed, but only abnormal results are displayed)  Labs Reviewed  CBC WITH DIFFERENTIAL/PLATELET - Abnormal; Notable for the following components:      Result Value   RBC 2.13 (*)    Hemoglobin 3.9 (*)    HCT 14.0 (*)    MCV 65.7 (*)    MCH 18.3 (*)    MCHC 27.9 (*)    RDW 21.9 (*)    nRBC 0.6 (*)    All other components within normal limits  COMPREHENSIVE METABOLIC PANEL - Abnormal; Notable for the following components:   Glucose, Bld 154 (*)    Calcium 8.8 (*)    All other components within normal limits  IRON AND TIBC -  Abnormal; Notable for the following components:   TIBC 456 (*)    All other components within normal limits  RESP PANEL BY RT-PCR (FLU A&B, COVID) ARPGX2  MAGNESIUM  PROCALCITONIN  BRAIN NATRIURETIC PEPTIDE  URINALYSIS, COMPLETE (UACMP) WITH MICROSCOPIC  RETICULOCYTES  TYPE AND SCREEN  PREPARE RBC (CROSSMATCH)  ABO/RH  TROPONIN I (HIGH SENSITIVITY)  TROPONIN I (HIGH SENSITIVITY)   ____________________________________________  EKG  ECG shows sinus rhythm with a ventricular to 73, some artifact in lead II and lead III as well as lead I and a nonspecific change in V3 without other clearance of acute ischemia or significant arrhythmia. ____________________________________________  RADIOLOGY  ED MD interpretation: CT head shows no acute intracranial process.  Unchanged chronic small vessel ischemia.  Chest x-ray has no evidence of pneumonia, edema, pneumothorax or any other clear acute intrathoracic process.  CTA abdomen pelvis shows no active bleeding.  There is moderate aortic atherosclerosis with stent seen in SMA which is patent.  Moderate diffuse colonic stool burden without diverticulitis.  There is a small hiatal hernia and fluid in distal esophagus and right nonobstructing kidney stone.  Calcified fibroids in the uterus and aortic atherosclerosis.  No other clear acute abdominopelvic process.  Official radiology report(s): DG Chest 2 View  Result Date: 03/04/2021 CLINICAL DATA:  Dyspnea. Fatigue. [place] EXAM: CHEST - 2 VIEW COMPARISON:  11/24/2008 FINDINGS: The heart size and mediastinal contours are within normal limits. Aortic atherosclerotic calcification noted. Both lungs are clear. The visualized skeletal structures are unremarkable. IMPRESSION: No active cardiopulmonary disease. Electronically Signed   By: Marlaine Hind M.D.   On: 03/04/2021 21:58   CT Head Wo Contrast  Result Date: 03/04/2021 CLINICAL DATA:  Neuro deficit, acute, stroke suspected Episodes of slurred  speech today, now resolved. EXAM: CT HEAD WITHOUT CONTRAST TECHNIQUE: Contiguous axial images were obtained from the base of the skull through the vertex without intravenous contrast. COMPARISON:  Head CT 10/31/2020 FINDINGS: Brain: No intracranial hemorrhage, mass effect, or midline shift. No hydrocephalus. The basilar cisterns are patent. Similar periventricular and deep chronic small vessel ischemia to prior exam. No evidence of territorial infarct or acute ischemia. No extra-axial or intracranial fluid collection. Vascular: Atherosclerosis of skullbase vasculature without hyperdense vessel or abnormal calcification. Skull: No fracture or focal lesion. Sinuses/Orbits: None acute, allowing for motion artifact. Other: None. IMPRESSION: 1. No acute intracranial abnormality. 2. Unchanged chronic small vessel ischemia. Electronically Signed   By: Keith Rake M.D.   On: 03/04/2021 22:02   CT Angio Abd/Pel W and/or Wo Contrast  Result Date: 03/04/2021 CLINICAL DATA:  GI bleed.  Fatigue. EXAM: CTA ABDOMEN AND PELVIS WITHOUT AND WITH CONTRAST TECHNIQUE: Multidetector CT imaging of the abdomen and pelvis was performed using the standard protocol during bolus administration of intravenous contrast. Multiplanar reconstructed images and MIPs were obtained and reviewed to evaluate the vascular anatomy. CONTRAST:  133m OMNIPAQUE IOHEXOL 350 MG/ML SOLN COMPARISON:  None. FINDINGS: VASCULAR Aorta: Normal caliber aorta without aneurysm, dissection, vasculitis or significant stenosis. Moderate atherosclerosis. Celiac: Plaque at the origin of the celiac artery causes minimal stenosis. Patent distal branches. No dissection or acute findings. SMA: Stented proximal SMA, stent is patent. Distal branches are patent. No dissection or acute findings. Renals: Plaque at the origin of both renal arteries causing mild stenosis, right greater than left. No dissection or acute findings. IMA: Patent. Inflow: Patent without evidence of  aneurysm, dissection, vasculitis or significant stenosis. Moderate atheromatous plaque. Proximal Outflow: Bilateral common femoral and visualized portions of the superficial and profunda femoral arteries are patent without evidence of aneurysm, dissection, vasculitis or significant stenosis. Veins: Venous phase imaging demonstrates patent iliac system and IVC. Patent portal, splenic, and mesenteric veins. No acute venous findings. Review of the MIP images confirms the above findings. NON-VASCULAR Lower chest: Cardiomegaly. Linear atelectasis in both lower lobes. Decreased density of the blood pool suggesting anemia. Hepatobiliary: No focal liver abnormality is seen. No gallstones, gallbladder wall thickening, or biliary dilatation. Pancreas: No ductal dilatation or inflammation. Spleen: Normal in size without focal abnormality. Adrenals/Urinary Tract: Normal adrenal glands. No hydronephrosis. 8 mm nonobstructing stone in the upper right kidney. 6.3 cm central left renal cyst. Decompressed ureters. Minimally distended urinary bladder. Stomach/Bowel: Fluid distended stomach. Small hiatal hernia with fluid in the distal esophagus and mild distal esophageal wall thickening. There is no extravasation of contrast in the GI tract to localize site of GI bleed. Unremarkable small bowel. Moderate stool burden throughout the colon. Sigmoid colon is redundant. Minimal sigmoid diverticulosis. No diverticulitis. No colonic inflammation. Normal appendix. Lymphatic: No enlarged lymph nodes in the abdomen or pelvis. Reproductive: Enlarged lobulated uterus with multiple calcified fibroids. Ovaries are not discretely seen. Other: No free air or free fluid. Tiny fat containing umbilical hernia. Musculoskeletal: Degenerative change in the spine and both hips. There are no acute or suspicious osseous abnormalities. IMPRESSION: 1. No accumulation of contrast in the GI tract to localize site of GI bleed. 2. Moderate aortic atherosclerosis.  Moderate branch atherosclerosis. SMA stent which is patent. 3. Moderate diffuse colonic stool burden with colonic redundancy. Minimal sigmoid diverticulosis without diverticulitis. 4. Small hiatal hernia with fluid in the distal esophagus and mild distal esophageal wall thickening, can be seen with reflux or esophagitis. There is ingested material distending  the stomach. 5. Right nonobstructing renal stone. 6. Enlarged lobulated uterus with multiple calcified fibroids. Aortic Atherosclerosis (ICD10-I70.0). Electronically Signed   By: Keith Rake M.D.   On: 03/04/2021 22:48    ____________________________________________   PROCEDURES  Procedure(s) performed (including Critical Care):  .Critical Care  Date/Time: 03/04/2021 11:02 PM Performed by: Lucrezia Starch, MD Authorized by: Lucrezia Starch, MD   Critical care provider statement:    Critical care time (minutes):  45   Critical care was necessary to treat or prevent imminent or life-threatening deterioration of the following conditions:  Circulatory failure   Critical care was time spent personally by me on the following activities:  Discussions with consultants, evaluation of patient's response to treatment, examination of patient, ordering and performing treatments and interventions, ordering and review of laboratory studies, ordering and review of radiographic studies, pulse oximetry, re-evaluation of patient's condition, obtaining history from patient or surrogate and review of old charts   ____________________________________________   INITIAL IMPRESSION / [place] / ED COURSE      Patient presents with above-stated history and exam for assessment of 2 seemingly separate but possibly related issues.  First patient had a couple minutes earlier this morning around 8 or 9 of some slurred speech that seem to resolve.  This can happen right as they were coming into the emergency room on the last couple minutes and again  resolved.  This was witnessed by son who is at bedside and provides a family history as patient is demented and does not recall events earlier today or recent medical history.  In addition son notes she has had several weeks of worsening shortness of breath primarily with exertion.  He notes he has also been fairly fatigued.  She is on aspirin Plavix but does not take any other anticoagulation.  He is not sure if she has ever had any GI bleeding.  With regard to the slurred speech differential includes possible CVA, TIA, seizure versus brief altered mental status from possible metabolic derangements.  No historical or exam features to suggest acute infectious process and have a low suspicion for toxic ingestion.  However will check a UA to see if patient could have a UTI.  With regard to dyspnea on exertion of the last couple of weeks worsening differential includes symptomatic anemia, arrhythmia, ACS, PE, heart failure and metabolic derangements.   CT head shows no acute intracranial process.  Unchanged chronic small vessel ischemia.  Chest x-ray has no evidence of pneumonia, edema, pneumothorax or any other clear acute intrathoracic process.  CTA abdomen pelvis shows no active bleeding.  There is moderate aortic atherosclerosis with stent seen in SMA which is patent.  Moderate diffuse colonic stool burden without diverticulitis.  There is a small hiatal hernia and fluid in distal esophagus and right nonobstructing kidney stone.  Calcified fibroids in the uterus and aortic atherosclerosis.  No other clear acute abdominopelvic process.  Patient for PE at this time as patient has no tachycardia, hypoxia, tachypnea and denies any current chest pain or shortness of breath without clear risk factors.  ECG and initial troponin are not consistent with ACS or myocarditis.  No significant arrhythmia identified.  CMP shows no significant electrolyte or metabolic derangements.  CBC shows no leukocytosis but  hemoglobin of 3.9 compared to 12.94 months ago.  Platelets are normal.  Procalcitonin is undetectable and given absence of focal consolidation on chest x-ray fever or leukocytosis I have a low suspicion for pneumonia or acute  bacterial infection.  BNP is double digits and overall patient does not appear acutely volume overloaded.  COVID influenza PCR is negative.  I suspect patient's dyspnea with exertion and fatigue last couple weeks is likely due to her anemia.  I am concerned for possible GI source given Hemoccult positive findings.  We will start Protonix.  No active bleeding visualized on exam.  Unclear if this is related or contributing to her slurred speech although will order MRI to assess for possible CVA however she has no objective findings on exam this may also represent a TIA.  I will admit to medicine service for further evaluation and management.     ____________________________________________   FINAL CLINICAL IMPRESSION(S) / ED DIAGNOSES  Final diagnoses:  Slurred speech  Low hemoglobin  Gastrointestinal hemorrhage, unspecified gastrointestinal hemorrhage type    Medications  0.9 %  sodium chloride infusion (0 mL/hr Intravenous Hold 03/04/21 2323)  pantoprazole (PROTONIX) injection 40 mg (40 mg Intravenous Given 03/04/21 2306)  iohexol (OMNIPAQUE) 350 MG/ML injection 100 mL (100 mLs Intravenous Contrast Given 03/04/21 2215)     ED Discharge Orders     None        Note:  This document was prepared using Dragon voice recognition software and may include unintentional dictation errors.    Lucrezia Starch, MD 03/04/21 [phone]

## 2021-03-04 NOTE — H&P (Signed)
History and Physical    Janet Gross [id number] DOB: 1931/03/07 DOA: 03/04/2021  PCP: Baxter Hire, MD   Patient coming from: home  I have personally briefly reviewed patient's old medical records in [place]  Chief Complaint: slurred speech  HPI: Janet Gross is a 87 y.o. female with medical history significant for DM, HTN, dementia, chronic mesenteric ischemia status post SMA stent on DAPT, with history of iron deficiency anemia and upper and lower endoscopy in 2018 showing mild reactive gastropathy on EGD and tubular adenomas and hyperplastic polyps on colonoscopy who was brought to the emergency room by her son after 2 episodes of slurred speech at home lasting 3 to 4 minutes.  Patient returned to baseline after the episodes.  Most of the history is given by her son at the bedside who states that his mother had been previously very active up until 3 months ago and would walk in the neighborhood for up to a mile with him daily.  He also shows me a video of his mother riding a bicycle up until a year ago.  He states over the past 3 months she would get very fatigued walking short distances and would need to sit down.  States he brought it to the attention of the PCP but they thought it was related to her age.  She has had no recent cough, fever or chills, abdominal pain, nausea vomiting or diarrhea.  No reports of black or bloody stool.  He states that she may have had symptoms but she does not like to complain so he might not know about it.  ED course: Afebrile with BP 146/46, pulse 79 with O2 sat 100% on room air Hemoglobin 3.9 down from 12.9 four months prior.  Other labs unremarkable.  Stool guaiac positive  EKG, personally viewed and interpreted: NSR at 76 with nonspecific ST-T wave changes  Imaging: CT angio abdomen and pelvis showed no accumulation of contrast in the GI tract to localize site of GI bleed.  Also showed other nonacute findings CT head no acute  intracranial abnormality Chest x-ray no active disease  Patient started on blood transfusions.  Hospitalist consulted for admission. The ED provider ordered an MRI to evaluate for acute stroke, currently pending.    Review of Systems: Limited due to dementia  Past Medical History:  Diagnosis Date   Anemia    B12 deficiency 08/03/2016   Diabetes mellitus without complication ([place])    Gastric ulcer    Hypothyroidism    Onychomycosis 02/04/2013   Osteoporosis    Peptic ulcer     Past Surgical History:  Procedure Laterality Date   cataract left     COLONOSCOPY     COLONOSCOPY WITH PROPOFOL N/A 11/13/2016   Procedure: COLONOSCOPY WITH PROPOFOL;  Surgeon: Lollie Sails, MD;  Location: [facility] ENDOSCOPY;  Service: Endoscopy;  Laterality: N/A;   ESOPHAGOGASTRODUODENOSCOPY     ESOPHAGOGASTRODUODENOSCOPY (EGD) WITH PROPOFOL N/A 11/13/2016   Procedure: ESOPHAGOGASTRODUODENOSCOPY (EGD) WITH PROPOFOL;  Surgeon: Lollie Sails, MD;  Location: [facility] ENDOSCOPY;  Service: Endoscopy;  Laterality: N/A;   EXCISION VAGINAL CYST     neck artery     carotid endarterectomy   VISCERAL ANGIOGRAPHY N/A 11/15/2020   Procedure: VISCERAL ANGIOGRAPHY;  Surgeon: Katha Cabal, MD;  Location: [place] CV LAB;  Service: Cardiovascular;  Laterality: N/A;     reports that she quit smoking about 57 years ago. Her smoking use included cigarettes. She has a 2.50 pack-year  smoking history. She has never used smokeless tobacco. She reports that she does not drink alcohol and does not use drugs.  Allergies  Allergen Reactions   Sulfa Antibiotics Other (See Comments)    INCREASES HER BLOOD SUGAR , PT STATED     Family History  Problem Relation Age of Onset   Heart attack Mother    Emphysema Father    Diabetes Sister    Diabetes Brother       Prior to Admission medications   Medication Sig Start Date End Date Taking? Authorizing Provider  aspirin EC 81 MG tablet Take 1 tablet (81 mg total) by  mouth daily. 11/16/20  Yes Schnier, Dolores Lory, MD  atorvastatin (LIPITOR) 20 MG tablet Take 20 mg by mouth daily.   Yes [provider]  clopidogrel (PLAVIX) 75 MG tablet TAKE 1 TABLET BY MOUTH EVERY DAY 02/10/21  Yes Schnier, Dolores Lory, MD  donepezil (ARICEPT) 5 MG tablet Take 5 mg by mouth at bedtime. 04/25/19  Yes [provider]  gabapentin (NEURONTIN) 300 MG capsule Take 300 mg by mouth at bedtime.    Yes [provider]  glimepiride (AMARYL) 1 MG tablet Take 1 mg by mouth daily with breakfast. 12/18/18  Yes [provider]  levothyroxine (SYNTHROID) 100 MCG tablet 100 mcg daily before breakfast. 11/17/19  Yes [provider]  losartan (COZAAR) 25 MG tablet Take 25 mg by mouth daily.   Yes [provider]  memantine (NAMENDA) 10 MG tablet Take 10 mg by mouth 2 (two) times daily. 12/19/20  Yes [provider]  metFORMIN (GLUCOPHAGE) 1000 MG tablet Take 500 mg by mouth 2 (two) times daily with a meal.   Yes [provider]  vitamin B-12 (CYANOCOBALAMIN) 1000 MCG tablet Take 1,000 mcg by mouth daily.   Yes [provider]    Physical Exam: Vitals:   03/04/21 2016 03/04/21 2019 03/04/21 2154 03/04/21 2316  BP:  (!) 146/46 (!) 126/92 (!) 122/48  Pulse:  79 79 76  Resp:  '20 19 19  '$ Temp:  98.4 F (36.9 C)  98.2 F (36.8 C)  TempSrc:  Oral  Oral  SpO2:  100% 100%   Weight: 60.3 kg     Height: '5\' 7"'$  (1.702 m)        Vitals:   03/04/21 2016 03/04/21 2019 03/04/21 2154 03/04/21 2316  BP:  (!) 146/46 (!) 126/92 (!) 122/48  Pulse:  79 79 76  Resp:  '20 19 19  '$ Temp:  98.4 F (36.9 C)  98.2 F (36.8 C)  TempSrc:  Oral  Oral  SpO2:  100% 100%   Weight: 60.3 kg     Height: '5\' 7"'$  (1.702 m)         Constitutional: Lethargic, somewhat restless  . Not in any apparent distress HEENT:      Head: Normocephalic and atraumatic.         Eyes: PERLA, EOMI, Conjunctivae are normal. Sclera is non-icteric.        Mouth/Throat: Mucous membranes are moist.       Neck: Supple with no signs of meningismus. Cardiovascular: Regular rate and rhythm. No murmurs, gallops, or rubs. 2+ symmetrical distal pulses are present . No JVD. No LE edema Respiratory: Respiratory effort normal .Lungs sounds clear bilaterally. No wheezes, crackles, or rhonchi.  Gastrointestinal: Soft, non tender, and non distended with positive bowel sounds.  Genitourinary: No CVA tenderness. Musculoskeletal: Nontender with normal range of motion in all extremities. No cyanosis,  or erythema of extremities. Neurologic:  Face is symmetric. Moving all extremities. No gross focal neurologic deficits . Skin: Skin is warm, dry.  No rash or ulcers Psychiatric: Mood and affect difficult to assess due to lethargy   Labs on Admission: I have personally reviewed following labs and imaging studies  CBC: Recent Labs  Lab 03/04/21 2023  WBC 4.7  NEUTROABS 2.9  HGB 3.9*  HCT 14.0*  MCV 65.7*  PLT 123XX123   Basic Metabolic Panel: Recent Labs  Lab 03/04/21 2023  NA 139  K 3.6  CL 110  CO2 22  GLUCOSE 154*  BUN 12  CREATININE 0.83  CALCIUM 8.8*  MG 2.0   GFR: Estimated Creatinine Clearance: 45.5 mL/min (by C-G formula based on SCr of 0.83 mg/dL). Liver Function Tests: Recent Labs  Lab 03/04/21 2023  AST 17  ALT 14  ALKPHOS 56  BILITOT 0.4  PROT 6.5  ALBUMIN 3.7   No results for input(s): LIPASE, AMYLASE in the last 168 hours. No results for input(s): AMMONIA in the last 168 hours. Coagulation Profile: No results for input(s): INR, PROTIME in the last 168 hours. Cardiac Enzymes: No results for input(s): CKTOTAL, CKMB, CKMBINDEX, TROPONINI in the last 168 hours. BNP (last 3 results) No results for input(s): PROBNP in the last 8760 hours. HbA1C: No results for input(s): HGBA1C in the last 72 hours. CBG: No results for input(s): GLUCAP in the last 168 hours. Lipid Profile: No results for input(s): CHOL, HDL, LDLCALC, TRIG,  CHOLHDL, LDLDIRECT in the last 72 hours. Thyroid Function Tests: No results for input(s): TSH, T4TOTAL, FREET4, T3FREE, THYROIDAB in the last 72 hours. Anemia Panel: Recent Labs    03/04/21 2023  TIBC 456*  IRON 107   Urine analysis:    Component Value Date/Time   COLORURINE YELLOW (A) 10/31/2020 1810   APPEARANCEUR CLEAR (A) 10/31/2020 1810   APPEARANCEUR Clear 08/19/2013 2014   LABSPEC 1.011 10/31/2020 1810   LABSPEC 1.020 08/19/2013 2014   PHURINE 5.0 10/31/2020 1810   GLUCOSEU NEGATIVE 10/31/2020 1810   GLUCOSEU 150 mg/dL 08/19/2013 2014   HGBUR NEGATIVE 10/31/2020 1810   BILIRUBINUR NEGATIVE 10/31/2020 1810   BILIRUBINUR Negative 08/19/2013 2014   KETONESUR NEGATIVE 10/31/2020 1810   PROTEINUR NEGATIVE 10/31/2020 1810   NITRITE NEGATIVE 10/31/2020 1810   LEUKOCYTESUR SMALL (A) 10/31/2020 1810   LEUKOCYTESUR 1+ 08/19/2013 2014    Radiological Exams on Admission: DG Chest 2 View  Result Date: 03/04/2021 CLINICAL DATA:  Dyspnea. Fatigue. [place] EXAM: CHEST - 2 VIEW COMPARISON:  11/24/2008 FINDINGS: The heart size and mediastinal contours are within normal limits. Aortic atherosclerotic calcification noted. Both lungs are clear. The visualized skeletal structures are unremarkable. IMPRESSION: No active cardiopulmonary disease. Electronically Signed   By: Marlaine Hind M.D.   On: 03/04/2021 21:58   CT Head Wo Contrast  Result Date: 03/04/2021 CLINICAL DATA:  Neuro deficit, acute, stroke suspected Episodes of slurred speech today, now resolved. EXAM: CT HEAD WITHOUT CONTRAST TECHNIQUE: Contiguous axial images were obtained from the base of the skull through the vertex without intravenous contrast. COMPARISON:  Head CT 10/31/2020 FINDINGS: Brain: No intracranial hemorrhage, mass effect, or midline shift. No hydrocephalus. The basilar cisterns are patent. Similar periventricular and deep chronic small vessel ischemia to prior exam. No evidence of territorial infarct or acute  ischemia. No extra-axial or intracranial fluid collection. Vascular: Atherosclerosis of skullbase vasculature without hyperdense vessel or abnormal calcification. Skull: No fracture or focal lesion. Sinuses/Orbits: None acute, allowing for motion  artifact. Other: None. IMPRESSION: 1. No acute intracranial abnormality. 2. Unchanged chronic small vessel ischemia. Electronically Signed   By: Keith Rake M.D.   On: 03/04/2021 22:02   CT Angio Abd/Pel W and/or Wo Contrast  Result Date: 03/04/2021 CLINICAL DATA:  GI bleed.  Fatigue. EXAM: CTA ABDOMEN AND PELVIS WITHOUT AND WITH CONTRAST TECHNIQUE: Multidetector CT imaging of the abdomen and pelvis was performed using the standard protocol during bolus administration of intravenous contrast. Multiplanar reconstructed images and MIPs were obtained and reviewed to evaluate the vascular anatomy. CONTRAST:  142m OMNIPAQUE IOHEXOL 350 MG/ML SOLN COMPARISON:  None. FINDINGS: VASCULAR Aorta: Normal caliber aorta without aneurysm, dissection, vasculitis or significant stenosis. Moderate atherosclerosis. Celiac: Plaque at the origin of the celiac artery causes minimal stenosis. Patent distal branches. No dissection or acute findings. SMA: Stented proximal SMA, stent is patent. Distal branches are patent. No dissection or acute findings. Renals: Plaque at the origin of both renal arteries causing mild stenosis, right greater than left. No dissection or acute findings. IMA: Patent. Inflow: Patent without evidence of aneurysm, dissection, vasculitis or significant stenosis. Moderate atheromatous plaque. Proximal Outflow: Bilateral common femoral and visualized portions of the superficial and profunda femoral arteries are patent without evidence of aneurysm, dissection, vasculitis or significant stenosis. Veins: Venous phase imaging demonstrates patent iliac system and IVC. Patent portal, splenic, and mesenteric veins. No acute venous findings. Review of the MIP images  confirms the above findings. NON-VASCULAR Lower chest: Cardiomegaly. Linear atelectasis in both lower lobes. Decreased density of the blood pool suggesting anemia. Hepatobiliary: No focal liver abnormality is seen. No gallstones, gallbladder wall thickening, or biliary dilatation. Pancreas: No ductal dilatation or inflammation. Spleen: Normal in size without focal abnormality. Adrenals/Urinary Tract: Normal adrenal glands. No hydronephrosis. 8 mm nonobstructing stone in the upper right kidney. 6.3 cm central left renal cyst. Decompressed ureters. Minimally distended urinary bladder. Stomach/Bowel: Fluid distended stomach. Small hiatal hernia with fluid in the distal esophagus and mild distal esophageal wall thickening. There is no extravasation of contrast in the GI tract to localize site of GI bleed. Unremarkable small bowel. Moderate stool burden throughout the colon. Sigmoid colon is redundant. Minimal sigmoid diverticulosis. No diverticulitis. No colonic inflammation. Normal appendix. Lymphatic: No enlarged lymph nodes in the abdomen or pelvis. Reproductive: Enlarged lobulated uterus with multiple calcified fibroids. Ovaries are not discretely seen. Other: No free air or free fluid. Tiny fat containing umbilical hernia. Musculoskeletal: Degenerative change in the spine and both hips. There are no acute or suspicious osseous abnormalities. IMPRESSION: 1. No accumulation of contrast in the GI tract to localize site of GI bleed. 2. Moderate aortic atherosclerosis. Moderate branch atherosclerosis. SMA stent which is patent. 3. Moderate diffuse colonic stool burden with colonic redundancy. Minimal sigmoid diverticulosis without diverticulitis. 4. Small hiatal hernia with fluid in the distal esophagus and mild distal esophageal wall thickening, can be seen with reflux or esophagitis. There is ingested material distending the stomach. 5. Right nonobstructing renal stone. 6. Enlarged lobulated uterus with multiple  calcified fibroids. Aortic Atherosclerosis (ICD10-I70.0). Electronically Signed   By: MKeith RakeM.D.   On: 03/04/2021 22:48     Assessment/Plan 87year old female with history of DM, HTN, dementia, chronic mesenteric ischemia status post SMA stent, brought by son after 2 episodes of slurred speech at home .  33-monthistory of fatigue, though very active at baseline.  Hemoglobin 3.9    Symptomatic anemia    Acute blood loss anemia, GI -Patient presented with 2 episodes of slurred  speech with finding of hemoglobin of 3.9, down from 12.9  four months prior and positive guaiac - Patient on dual antiplatelet therapy with aspirin and Plavix for chronic mesenteric ischemia status post stent to SMA - Last EGD 2018 with erosive gastropathy, colonoscopy with tubular adenomas and hyperplastic polyps - Continue transfusion of 2-3 units packed red blood cells - Monitor hemoglobin -IV Protonix twice daily - N.p.o. except ice chips - GI consult    Slurred speech x2 - Possible TIA related to low flow state from symptomatic anemia - Head CT negative - Follow MRI - Hold aspirin and Plavix  Chronic mesenteric ischemia status post SMA stent - Holding aspirin and Plavix due to GI bleed    Controlled type 2 diabetes mellitus without complication (HCC) - Sliding scale insulin coverage    Adult hypothyroidism - Continue levothyroxine    Essential hypertension - Holding antihypertensives due to somewhat soft blood pressure    Late onset Alzheimer's disease without behavioral disturbance (HCC) - Stable   Chronic mesenteric ischemia (HCC)    DVT prophylaxis: SCDs Code Status: full code  Family Communication:  none  Disposition Plan: Back to previous home environment Consults called: GI Status:At the time of admission, it appears that the appropriate admission status for this patient is INPATIENT. This is judged to be reasonable and necessary in order to provide the required intensity of  service to ensure the patient's safety given the presenting symptoms, physical exam findings, and initial radiographic and laboratory data in the context of their  Comorbid conditions.   Patient requires inpatient status due to high intensity of service, high risk for further deterioration and high frequency of surveillance required.   I certify that at the point of admission it is my clinical judgment that the patient will require inpatient hospital care spanning beyond [place] MD [facility]     03/04/2021, 11:30 PM

## 2021-03-05 ENCOUNTER — Inpatient Hospital Stay: Payer: Medicare Other

## 2021-03-05 DIAGNOSIS — D62 Acute posthemorrhagic anemia: Secondary | ICD-10-CM | POA: Diagnosis not present

## 2021-03-05 DIAGNOSIS — D649 Anemia, unspecified: Secondary | ICD-10-CM | POA: Diagnosis not present

## 2021-03-05 DIAGNOSIS — D509 Iron deficiency anemia, unspecified: Secondary | ICD-10-CM

## 2021-03-05 DIAGNOSIS — R4781 Slurred speech: Secondary | ICD-10-CM

## 2021-03-05 LAB — RETICULOCYTES
Immature Retic Fract: 20 % — ABNORMAL HIGH (ref 2.3–15.9)
RBC.: 3.3 MIL/uL — ABNORMAL LOW (ref 3.87–5.11)
Retic Count, Absolute: 69 10*3/uL (ref 19.0–186.0)
Retic Ct Pct: 2.1 % (ref 0.4–3.1)

## 2021-03-05 LAB — GLUCOSE, CAPILLARY
Glucose-Capillary: 62 mg/dL — ABNORMAL LOW (ref 70–99)
Glucose-Capillary: 103 mg/dL — ABNORMAL HIGH (ref 70–99)
Glucose-Capillary: 99 mg/dL (ref 70–99)
Glucose-Capillary: 132 mg/dL — ABNORMAL HIGH (ref 70–99)

## 2021-03-05 LAB — CBC
HCT: 24.5 % — ABNORMAL LOW (ref 36.0–46.0)
HCT: 28.2 % — ABNORMAL LOW (ref 36.0–46.0)
Hemoglobin: 7.5 g/dL — ABNORMAL LOW (ref 12.0–15.0)
Hemoglobin: 9.1 g/dL — ABNORMAL LOW (ref 12.0–15.0)
MCH: 22.7 pg — ABNORMAL LOW (ref 26.0–34.0)
MCH: 24.7 pg — ABNORMAL LOW (ref 26.0–34.0)
MCHC: 30.6 g/dL (ref 30.0–36.0)
MCHC: 32.3 g/dL (ref 30.0–36.0)
MCV: 74.2 fL — ABNORMAL LOW (ref 80.0–100.0)
MCV: 76.4 fL — ABNORMAL LOW (ref 80.0–100.0)
Platelets: 290 10*3/uL (ref 150–400)
Platelets: 357 10*3/uL (ref 150–400)
RBC: 3.3 MIL/uL — ABNORMAL LOW (ref 3.87–5.11)
RBC: 3.69 MIL/uL — ABNORMAL LOW (ref 3.87–5.11)
RDW: 23 % — ABNORMAL HIGH (ref 11.5–15.5)
RDW: 22.1 % — ABNORMAL HIGH (ref 11.5–15.5)
WBC: 7.5 10*3/uL (ref 4.0–10.5)
WBC: 6.2 10*3/uL (ref 4.0–10.5)
nRBC: 0 % (ref 0.0–0.2)
nRBC: 0.5 % — ABNORMAL HIGH (ref 0.0–0.2)

## 2021-03-05 LAB — HEMOGLOBIN A1C
Hgb A1c MFr Bld: 5.7 % — ABNORMAL HIGH (ref 4.8–5.6)
Mean Plasma Glucose: 116.89 mg/dL

## 2021-03-05 LAB — CBG MONITORING, ED: Glucose-Capillary: 118 mg/dL — ABNORMAL HIGH (ref 70–99)

## 2021-03-05 LAB — TROPONIN I (HIGH SENSITIVITY): Troponin I (High Sensitivity): 11 ng/L (ref <18)

## 2021-03-05 MED ORDER — DONEPEZIL HCL 5 MG PO TABS
5.0000 mg | ORAL_TABLET | Freq: Every day | ORAL | Status: DC
  Administered 2021-03-05 – 2021-03-06 (×2): 5 mg via ORAL
  Filled 2021-03-05 (×4): qty 1

## 2021-03-05 MED ORDER — ONDANSETRON HCL 4 MG PO TABS: 4.0000 mg | ORAL_TABLET | Freq: Four times a day (QID) | ORAL | Status: DC | PRN

## 2021-03-05 MED ORDER — HALOPERIDOL LACTATE 5 MG/ML IJ SOLN
INTRAMUSCULAR | Status: AC
  Administered 2021-03-05: 1 mg via INTRAVENOUS
  Filled 2021-03-05: qty 1

## 2021-03-05 MED ORDER — SODIUM CHLORIDE 0.9% IV SOLUTION
Freq: Once | INTRAVENOUS | Status: DC
  Filled 2021-03-05: qty 250

## 2021-03-05 MED ORDER — FUROSEMIDE 10 MG/ML IJ SOLN
20.0000 mg | Freq: Once | INTRAMUSCULAR | Status: DC
  Filled 2021-03-05: qty 4

## 2021-03-05 MED ORDER — DEXTROSE 50 % IV SOLN
12.5000 g | INTRAVENOUS | Status: AC
  Administered 2021-03-05: 12.5 g via INTRAVENOUS

## 2021-03-05 MED ORDER — LORAZEPAM 2 MG/ML IJ SOLN
INTRAMUSCULAR | Status: AC
  Administered 2021-03-05: 0.5 mg via INTRAVENOUS
  Filled 2021-03-05: qty 1

## 2021-03-05 MED ORDER — HALOPERIDOL LACTATE 5 MG/ML IJ SOLN: 1.0000 mg | Freq: Once | INTRAMUSCULAR | Status: AC | PRN

## 2021-03-05 MED ORDER — FUROSEMIDE 10 MG/ML IJ SOLN: 20.0000 mg | Freq: Once | INTRAMUSCULAR | Status: DC

## 2021-03-05 MED ORDER — ONDANSETRON HCL 4 MG/2ML IJ SOLN: 4.0000 mg | Freq: Four times a day (QID) | INTRAMUSCULAR | Status: DC | PRN

## 2021-03-05 MED ORDER — ACETAMINOPHEN 650 MG RE SUPP
650.0000 mg | Freq: Four times a day (QID) | RECTAL | Status: DC | PRN
Start: 2021-03-05 — End: 2021-03-06

## 2021-03-05 MED ORDER — DEXTROSE 50 % IV SOLN
INTRAVENOUS | Status: AC
  Filled 2021-03-05: qty 50

## 2021-03-05 MED ORDER — LOSARTAN POTASSIUM 25 MG PO TABS
25.0000 mg | ORAL_TABLET | Freq: Every day | ORAL | Status: DC
  Administered 2021-03-05 – 2021-03-07 (×3): 25 mg via ORAL
  Filled 2021-03-05 (×3): qty 1

## 2021-03-05 MED ORDER — LEVOTHYROXINE SODIUM 100 MCG PO TABS
100.0000 ug | ORAL_TABLET | Freq: Every day | ORAL | Status: DC
  Administered 2021-03-05 – 2021-03-07 (×3): 100 ug via ORAL
  Filled 2021-03-05 (×3): qty 1

## 2021-03-05 MED ORDER — GABAPENTIN 300 MG PO CAPS
300.0000 mg | ORAL_CAPSULE | Freq: Every day | ORAL | Status: DC
  Administered 2021-03-05 – 2021-03-06 (×2): 300 mg via ORAL
  Filled 2021-03-05 (×2): qty 1

## 2021-03-05 MED ORDER — MORPHINE SULFATE (PF) 2 MG/ML IV SOLN
2.0000 mg | INTRAVENOUS | Status: DC | PRN
Start: 2021-03-05 — End: 2021-03-07

## 2021-03-05 MED ORDER — ATORVASTATIN CALCIUM 20 MG PO TABS
20.0000 mg | ORAL_TABLET | Freq: Every day | ORAL | Status: DC
  Administered 2021-03-05 – 2021-03-07 (×3): 20 mg via ORAL
  Filled 2021-03-05 (×4): qty 1

## 2021-03-05 MED ORDER — PANTOPRAZOLE SODIUM 40 MG IV SOLR
40.0000 mg | Freq: Two times a day (BID) | INTRAVENOUS | Status: DC
  Administered 2021-03-05 – 2021-03-07 (×5): 40 mg via INTRAVENOUS
  Filled 2021-03-05 (×5): qty 40

## 2021-03-05 MED ORDER — MEMANTINE HCL 5 MG PO TABS
10.0000 mg | ORAL_TABLET | Freq: Two times a day (BID) | ORAL | Status: DC
  Administered 2021-03-05 – 2021-03-07 (×5): 10 mg via ORAL
  Filled 2021-03-05: qty 1
  Filled 2021-03-05: qty 2
  Filled 2021-03-05 (×2): qty 1
  Filled 2021-03-05: qty 2
  Filled 2021-03-05: qty 1

## 2021-03-05 MED ORDER — LORAZEPAM 2 MG/ML IJ SOLN: 0.5000 mg | Freq: Once | INTRAMUSCULAR | Status: AC | PRN

## 2021-03-05 MED ORDER — INSULIN ASPART 100 UNIT/ML IJ SOLN
0.0000 [IU] | INTRAMUSCULAR | Status: DC
  Administered 2021-03-05: 1 [IU] via SUBCUTANEOUS
  Filled 2021-03-05 (×2): qty 1

## 2021-03-05 MED ORDER — ACETAMINOPHEN 325 MG PO TABS: 650.0000 mg | ORAL_TABLET | Freq: Four times a day (QID) | ORAL | Status: DC | PRN

## 2021-03-05 NOTE — ED Notes (Signed)
Patient transported to MRI 

## 2021-03-05 NOTE — Consult Note (Addendum)
Janet Antigua, MD [address], [place], [place], [place], [address] [address], [place], [place], [place], [address] Phone: [phone]  Fax: [phone]  Consultation  Referring Provider:     Dr. Damita Dunnings Primary Care Physician:  Baxter Hire, MD Reason for Consultation:     Anemia  Date of Admission:  03/04/2021 Date of Consultation:  03/05/2021         HPI:   Janet Gross is a 87 y.o. female with history of dementia and unable to provide history herself, with history obtained from son at bedside, and documentation in her chart.  Patient denies any complaints at this time herself.  Son states that patient has seemed to have less energy and fatigue over the last month.  Son states that patient has staff that comes to her house to take care of the patient during the week, and on the weekends, the son comes from [place] to take care of her.  Patient was also reported to have slurred sleeps on presentation and TIA was in the differential admission with MRI brain not showing any acute abnormalities.  He states that the patient has had low appetite and weight loss recently and they have also noted that she has new dysphagia as of the last 3 to 4 weeks.  They have not noted any melena, hematochezia or any sources of bleeding.  She is on aspirin Plavix at home.  Previous records reviewed and patient underwent SMA stent placement due to chronic mesenteric ischemia on November 15, 2020.  Clinic note from vascular surgery from May 2022 reviewed.  Patient has previously been evaluated by [facility] GI, last in 2019.  I reviewed the note by Laurine Blazer in 2019 in [place].  As per their notes, patient has had history of iron deficiency anemia and B12 deficiency and that she has had negative EGD and colonoscopy in 2018 for bleeding.  Colonoscopy record itself shows 3 subcentimeter polyps that were removed at the time.  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   The plan on the clinic visit in 2019 was to proceed with upper GI series and small bowel follow-through.  Upper GI series showed hiatal hernia, cervical esophageal web which was obstructive to passage of the barium tablet but did not obstruct liquid barium, direct visualization was recommended.  Normal stomach duodenum jejunum and ileum were noted.  As per telephone notes after this, capsule was done, but the results are not available anywhere in her chart or any other telephone notes  As per 2018 GI notes in Care Everywhere  "Colonoscopy - 06/2012- sessile polyp TC, sessile polyp cecum, path with tubular adenoma.  EGD 2013-benign appearing esophageal stricture dilated, gastritis, otherwise normal. Pathology with negative H. Pylori."  Past Medical History:  Diagnosis Date  . Anemia   . B12 deficiency 08/03/2016  . Diabetes mellitus without complication ([place])   . Gastric ulcer   . Hypothyroidism   . Onychomycosis 02/04/2013  . Osteoporosis   . Peptic ulcer     Past Surgical History:  Procedure Laterality Date  . cataract left    . COLONOSCOPY    . COLONOSCOPY WITH PROPOFOL N/A 11/13/2016   Procedure: COLONOSCOPY WITH PROPOFOL;  Surgeon: Lollie Sails, MD;  Location: [facility] ENDOSCOPY;  Service: Endoscopy;  Laterality: N/A;  . ESOPHAGOGASTRODUODENOSCOPY    . ESOPHAGOGASTRODUODENOSCOPY (EGD) WITH PROPOFOL N/A 11/13/2016   Procedure: ESOPHAGOGASTRODUODENOSCOPY (EGD) WITH PROPOFOL;  Surgeon: Lollie Sails,  MD;  Location: [facility] ENDOSCOPY;  Service: Endoscopy;  Laterality: N/A;  . EXCISION VAGINAL CYST    . neck artery     carotid endarterectomy  . VISCERAL ANGIOGRAPHY N/A 11/15/2020   Procedure: VISCERAL ANGIOGRAPHY;  Surgeon: Katha Cabal, MD;  Location: [facility] CV LAB;  Service: Cardiovascular;  Laterality: N/A;    Prior to Admission medications   Medication Sig Start Date End Date Taking? Authorizing Provider  aspirin EC 81 MG tablet Take 1  tablet (81 mg total) by mouth daily. 11/16/20  Yes Schnier, Dolores Lory, MD  atorvastatin (LIPITOR) 20 MG tablet Take 20 mg by mouth daily.   Yes [provider]  clopidogrel (PLAVIX) 75 MG tablet TAKE 1 TABLET BY MOUTH EVERY DAY 02/10/21  Yes Schnier, Dolores Lory, MD  donepezil (ARICEPT) 5 MG tablet Take 5 mg by mouth at bedtime. 04/25/19  Yes [provider]  gabapentin (NEURONTIN) 300 MG capsule Take 300 mg by mouth at bedtime.    Yes [provider]  glimepiride (AMARYL) 1 MG tablet Take 1 mg by mouth daily with breakfast. 12/18/18  Yes [provider]  levothyroxine (SYNTHROID) 100 MCG tablet 100 mcg daily before breakfast. 11/17/19  Yes [provider]  losartan (COZAAR) 25 MG tablet Take 25 mg by mouth daily.   Yes [provider]  memantine (NAMENDA) 10 MG tablet Take 10 mg by mouth 2 (two) times daily. 12/19/20  Yes [provider]  metFORMIN (GLUCOPHAGE) 1000 MG tablet Take 500 mg by mouth 2 (two) times daily with a meal.   Yes [provider]  vitamin B-12 (CYANOCOBALAMIN) 1000 MCG tablet Take 1,000 mcg by mouth daily.   Yes [provider]    Family History  Problem Relation Age of Onset  . Heart attack Mother   . Emphysema Father   . Diabetes Sister   . Diabetes Brother      Social History   Tobacco Use  . Smoking status: Former    Packs/day: 0.25    Years: 10.00    Pack years: 2.50    Types: Cigarettes    Quit date: 08/07/1963    Years since quitting: 57.6  . Smokeless tobacco: Never  . Tobacco comments:    quit over 40 years ago  Vaping Use  . Vaping Use: Never used  Substance Use Topics  . Alcohol use: No  . Drug use: No    Allergies as of 03/04/2021 - Review Complete 03/04/2021  Allergen Reaction Noted  . Sulfa antibiotics Other (See Comments) 04/22/2013    Review of Systems:    Unable to obtain due to patient's dementia.  Negative except per HPI as mentioned by son.  Patient herself  denies any abdominal pain or any other complaints at this time   Physical Exam:  Constitutional: General:   Alert,  Well-developed, well-nourished, pleasant and cooperative in NAD BP (!) 183/81 (BP Location: Right Arm)   Pulse 68   Temp 98.2 F (36.8 C)   Resp 16   Ht 5' 7.01" (1.702 m)   Wt 61.2 kg   SpO2 97%   BMI 21.14 kg/m   Eyes:  Sclera clear, no icterus.   Conjunctiva pink. PERRLA  Ears:  No scars, lesions or masses, Normal auditory acuity. Nose:  No deformity, discharge, or lesions. Mouth:  No deformity or lesions, oropharynx pink & moist.  Neck:  Supple; no masses or thyromegaly.  Respiratory: Normal respiratory effort, Normal percussion  Gastrointestinal:  Normal bowel  sounds.  No bruits.  Soft, non-tender and non-distended without masses, hepatosplenomegaly or hernias noted.  No guarding or rebound tenderness.     Cardiac: No clubbing or edema.  No cyanosis. Normal posterior tibial pedal pulses noted.  Lymphatic:  No significant cervical or axillary adenopathy.  Psych:  Alert and cooperative. Normal mood and affect.  Musculoskeletal:  Normal gait. Head normocephalic, atraumatic. Symmetrical without gross deformities. 5/5 Upper and Lower extremity strength bilaterally.  Skin: Warm. Intact without significant lesions or rashes. No jaundice.  Neurologic:  Face symmetrical, tongue midline, Normal sensation to touch;  grossly normal neurologically.  Psych:   Alert and cooperative. Normal mood and affect.   LAB RESULTS: Recent Labs    03/04/21 2023  WBC 4.7  HGB 3.9*  HCT 14.0*  PLT 368   BMET Recent Labs    03/04/21 2023  NA 139  K 3.6  CL 110  CO2 22  GLUCOSE 154*  BUN 12  CREATININE 0.83  CALCIUM 8.8*   LFT Recent Labs    03/04/21 2023  PROT 6.5  ALBUMIN 3.7  AST 17  ALT 14  ALKPHOS 56  BILITOT 0.4   PT/INR No results for input(s): LABPROT, INR in the last 72 hours.  STUDIES: DG Chest 2 View  Result Date: 03/04/2021 CLINICAL  DATA:  Dyspnea. Fatigue. [place]. EXAM: CHEST - 2 VIEW COMPARISON:  11/24/2008 FINDINGS: The heart size and mediastinal contours are within normal limits. Aortic atherosclerotic calcification noted. Both lungs are clear. The visualized skeletal structures are unremarkable. IMPRESSION: No active cardiopulmonary disease. Electronically Signed   By: Marlaine Hind M.D.   On: 03/04/2021 21:58   CT Head Wo Contrast  Result Date: 03/04/2021 CLINICAL DATA:  Neuro deficit, acute, stroke suspected Episodes of slurred speech today, now resolved. EXAM: CT HEAD WITHOUT CONTRAST TECHNIQUE: Contiguous axial images were obtained from the base of the skull through the vertex without intravenous contrast. COMPARISON:  Head CT 10/31/2020 FINDINGS: Brain: No intracranial hemorrhage, mass effect, or midline shift. No hydrocephalus. The basilar cisterns are patent. Similar periventricular and deep chronic small vessel ischemia to prior exam. No evidence of territorial infarct or acute ischemia. No extra-axial or intracranial fluid collection. Vascular: Atherosclerosis of skullbase vasculature without hyperdense vessel or abnormal calcification. Skull: No fracture or focal lesion. Sinuses/Orbits: None acute, allowing for motion artifact. Other: None. IMPRESSION: 1. No acute intracranial abnormality. 2. Unchanged chronic small vessel ischemia. Electronically Signed   By: Keith Rake M.D.   On: 03/04/2021 22:02   MR BRAIN WO CONTRAST  Result Date: 03/05/2021 CLINICAL DATA:  Initial evaluation for acute TIA. EXAM: MRI HEAD WITHOUT CONTRAST TECHNIQUE: Multiplanar, multiecho pulse sequences of the brain and surrounding structures were obtained without intravenous contrast. COMPARISON:  CT from 03/04/2021 and CTA from 11/03/2008. Major intracranial vascular flow voids are otherwise maintained. FINDINGS: Brain: Examination moderately degraded by motion artifact. Generalized age-related cerebral atrophy. Relatively mild chronic  microvascular ischemic disease noted involving the periventricular deep white matter both cerebral hemispheres. No abnormal foci of restricted diffusion to suggest acute or subacute ischemia. Gray-white matter differentiation maintained. No encephalomalacia to suggest chronic cortical infarction. No foci of susceptibility artifact to suggest acute or chronic intracranial hemorrhage. No mass lesion, midline shift or mass effect. Mild ventricular prominence related to global parenchymal volume loss without hydrocephalus. No extra-axial fluid collection. Pituitary gland and suprasellar region within normal limits. Midline structures intact. Vascular: Abnormal flow void within the left ICA to the siphon, consistent with chronic left ICA  occlusion Skull and upper cervical spine: Craniocervical junction within normal limits. Bone marrow signal intensity normal. No scalp soft tissue abnormality. Sinuses/Orbits: Prior bilateral ocular lens replacement. Paranasal sinuses are largely clear. Small left with trace right mastoid effusions, of doubtful significance. Inner ear structures grossly normal. Visualized nasopharynx unremarkable. Other: None. IMPRESSION: 1. No acute intracranial abnormality. 2. Generalized age-related cerebral atrophy with mild chronic small vessel ischemic disease. 3. Chronic left ICA occlusion. Electronically Signed   By: Jeannine Boga M.D.   On: 03/05/2021 03:56   CT Angio Abd/Pel W and/or Wo Contrast  Result Date: 03/04/2021 CLINICAL DATA:  GI bleed.  Fatigue. EXAM: CTA ABDOMEN AND PELVIS WITHOUT AND WITH CONTRAST TECHNIQUE: Multidetector CT imaging of the abdomen and pelvis was performed using the standard protocol during bolus administration of intravenous contrast. Multiplanar reconstructed images and MIPs were obtained and reviewed to evaluate the vascular anatomy. CONTRAST:  162m OMNIPAQUE IOHEXOL 350 MG/ML SOLN COMPARISON:  None. FINDINGS: VASCULAR Aorta: Normal caliber aorta  without aneurysm, dissection, vasculitis or significant stenosis. Moderate atherosclerosis. Celiac: Plaque at the origin of the celiac artery causes minimal stenosis. Patent distal branches. No dissection or acute findings. SMA: Stented proximal SMA, stent is patent. Distal branches are patent. No dissection or acute findings. Renals: Plaque at the origin of both renal arteries causing mild stenosis, right greater than left. No dissection or acute findings. IMA: Patent. Inflow: Patent without evidence of aneurysm, dissection, vasculitis or significant stenosis. Moderate atheromatous plaque. Proximal Outflow: Bilateral common femoral and visualized portions of the superficial and profunda femoral arteries are patent without evidence of aneurysm, dissection, vasculitis or significant stenosis. Veins: Venous phase imaging demonstrates patent iliac system and IVC. Patent portal, splenic, and mesenteric veins. No acute venous findings. Review of the MIP images confirms the above findings. NON-VASCULAR Lower chest: Cardiomegaly. Linear atelectasis in both lower lobes. Decreased density of the blood pool suggesting anemia. Hepatobiliary: No focal liver abnormality is seen. No gallstones, gallbladder wall thickening, or biliary dilatation. Pancreas: No ductal dilatation or inflammation. Spleen: Normal in size without focal abnormality. Adrenals/Urinary Tract: Normal adrenal glands. No hydronephrosis. 8 mm nonobstructing stone in the upper right kidney. 6.3 cm central left renal cyst. Decompressed ureters. Minimally distended urinary bladder. Stomach/Bowel: Fluid distended stomach. Small hiatal hernia with fluid in the distal esophagus and mild distal esophageal wall thickening. There is no extravasation of contrast in the GI tract to localize site of GI bleed. Unremarkable small bowel. Moderate stool burden throughout the colon. Sigmoid colon is redundant. Minimal sigmoid diverticulosis. No diverticulitis. No colonic  inflammation. Normal appendix. Lymphatic: No enlarged lymph nodes in the abdomen or pelvis. Reproductive: Enlarged lobulated uterus with multiple calcified fibroids. Ovaries are not discretely seen. Other: No free air or free fluid. Tiny fat containing umbilical hernia. Musculoskeletal: Degenerative change in the spine and both hips. There are no acute or suspicious osseous abnormalities. IMPRESSION: 1. No accumulation of contrast in the GI tract to localize site of GI bleed. 2. Moderate aortic atherosclerosis. Moderate branch atherosclerosis. SMA stent which is patent. 3. Moderate diffuse colonic stool burden with colonic redundancy. Minimal sigmoid diverticulosis without diverticulitis. 4. Small hiatal hernia with fluid in the distal esophagus and mild distal esophageal wall thickening, can be seen with reflux or esophagitis. There is ingested material distending the stomach. 5. Right nonobstructing renal stone. 6. Enlarged lobulated uterus with multiple calcified fibroids. Aortic Atherosclerosis (ICD10-I70.0). Electronically Signed   By: MKeith RakeM.D.   On: 03/04/2021 22:48  Impression / Plan:   Janet Gross is a 87 y.o. y/o female with presentation for slurred speech (MRI brain negative for acute findings), found to have severe microcytic anemia, with previous history of iron deficiency and B12 deficiency anemia, with no evidence of active GI bleeding, with stool guaiac positive in the ER  Patient's son reports that they have noted dysphagia and that the patient is spitting out her favorite foods such as peanuts, and seems to not be able to swallow solid foods and is having to spit those as soon as she swallows those and this started over the last 3 to 4 weeks  Given the above new symptom, would recommend EGD to evaluate for dysphagia and acute on chronic anemia  If EGD is negative, would recommend proceeding with colonoscopy for further evaluation of anemia and guaiac positive  stool  The stepwise approach above would allow for evaluation with EGD first to rule out malignancy given her dysphagia as patient may not be able to easily complete her colonoscopy prep.  And then if EGD is negative, can then proceed with colonoscopy in this elderly patient may take some time completing her prep or have challenges completing her prep.  In addition, she has had visualization with colonoscopy within the last 5 years  Would recommend completing anemia work-up given that iron saturation and serum iron were normal (23% and 107 respectively), to rule out any other causes  I did ask Dr. Reesa Chew if it would be safe to hold her Plavix given recent stent placement, and she subsequently asked vascular surgery on-call and they state okay to hold  As per son, last Plavix dose was on Friday  As per guidelines, Plavix will need to be held for 5 days before endoscopy.  In addition, case will need to be discussed with anesthesia team during the week given her presentation for slurred speech and possible TIA on admission, to see if they are okay with proceeding with endoscopy given these risk factors  Continue medical optimization at this time with serial CBCs and transfuse as needed.  Hemoglobin 3.9 on presentation with repeat pending post PRBC transfusion  PPI IV twice daily  Continue serial CBCs and transfuse PRN Avoid NSAIDs Maintain 2 large-bore IV lines Please page GI with any acute hemodynamic changes, or signs of active GI bleeding  No signs of frank GI bleeding or indication for emergent endoscopy at this time  Above discussed with son in detail and he would like to proceed with endoscopy for further evaluation of her anemia as well  I have discussed alternative options, risks & benefits,  which include, but are not limited to, bleeding, infection, perforation,respiratory complication & drug reaction.  The patient agrees with this plan & written consent will be obtained.    Dr. Allen Norris  will follow the patient from tomorrow  Thank you for involving me in the care of this patient.      LOS: 1 day   Virgel Manifold, MD  03/05/2021, 12:05 PM

## 2021-03-05 NOTE — ED Notes (Signed)
Pt's son came out of room and notified this RN that pt had removed her 2nd IV.  Pt cleaned of blood from infusion and gown changed.  Will hold off starting another IV at this time.  Pt also assisted to toilet at this time.

## 2021-03-05 NOTE — Progress Notes (Signed)
0630; Received pt from ED. NAD observed. Pt SR on tele. Pt with 1 unit of blood transfusing with no s/s of allergic reaction. Vitals taken partially d/t pt's dementia; pt aggravated upon arrival to room with son, Son will stay with Mother.

## 2021-03-05 NOTE — Progress Notes (Signed)
PROGRESS NOTE    Janet Gross  [id number] DOB: 04/06/1931 DOA: 03/04/2021 PCP: Baxter Hire, MD   Brief Narrative: Taken from H&P. Janet Gross is a 87 y.o. female with medical history significant for DM, HTN, dementia, chronic mesenteric ischemia status post SMA stent on DAPT, with history of iron deficiency anemia and upper and lower endoscopy in 2018 showing mild reactive gastropathy on EGD and tubular adenomas and hyperplastic polyps on colonoscopy who was brought to the emergency room by her son after 2 episodes of slurred speech at home lasting 3 to 4 minutes.  Patient returned to baseline after the episodes.  Most of the history is given by her son at the bedside, according to him her mother was pretty active approximately 3 months ago.  Since May she developed worsening fatigue and exertional dyspnea.  She had PCI to SMA done in April 2022 for critical mesenteric ischemia and was placed on DAPT.  Patient has underlying dementia and cannot tell me if she has experiencing black/tarry color stools or not.  Son has not seen any obvious bleeding.  This saw the PCP last week with similar complaints and apparently have some blood work-up, son was not sure about it but he was told by the doctor that blood work-up looks okay, on care everywhere she just had BMP and A1c which was within normal limit.  Cannot see any CBC done.  Prior CBC in March with hemoglobin of 13.  She was found to have hemoglobin of 3.9 when came to ED.  FOBT positive. Ordered initially 2 units of PRBC which she has already received. Ordered 2 more units. GI was consulted-they will proceed with scopes after washing out of aspirin and Plavix. Aspirin and Plavix is on hold-discussed with vascular and according to them that can be held until she stabilizes from possible GI bleed.  Subjective: Patient was seen and examined today.  She appears lethargic, easily arousable.  Denies any pain.  Son at bedside.  Per son he  has not noticed any obvious bleeding.  Patient is mostly oriented to self only and could not answer the question whether she has noticed any melena or hematochezia.  Denies any nausea or vomiting.  Denies any pain.  Assessment & Plan:   Principal Problem:   Symptomatic anemia Active Problems:   Controlled type 2 diabetes mellitus without complication (HCC)   Adult hypothyroidism   Essential hypertension   Late onset Alzheimer's disease without behavioral disturbance (HCC)   Chronic mesenteric ischemia (HCC)   Slurred speech  Symptomatic anemia.  Most likely secondary to GI losses as patient's FOBT was positive.  No obvious bleeding.  Worsening fatigue and exertional dyspnea for about 33-month since May.  No recorded CBC since then, CBC done in March was 13.  She was started on DAPT in April 2022 after getting PCI to SMA for mesenteric ischemia. GI was consulted and they will wait for Plavix washout. Discussed with vascular surgery and adjust okay to hold aspirin and Plavix till her bleed stabilizes. Already received 2 units, will be getting another 2 units. -Monitor hemoglobin -Transfuse below 7.  Slurred speech.  There was some questionable transient slurred speech recently which lasted for few minutes.  TIA can be a possibility as patient had very low hemoglobin.  CT head and MRI brain was negative for any acute abnormality. -Holding aspirin and Plavix -Continue statin  Chronic mesenteric ischemia status post SMA stent - Holding aspirin and Plavix due to GI  bleed     Controlled type 2 diabetes mellitus without complication (HCC) - Sliding scale insulin coverage     Adult hypothyroidism - Continue levothyroxine     Essential hypertension.  Blood pressure trending up.  Initially home dose of losartan was held due to softer blood pressure. -Restart home meds   Late onset Alzheimer's disease without behavioral disturbance (HCC) - Stable -Continue home dose of Aricept and  Namenda  Objective: Vitals:   03/05/21 0800 03/05/21 0820 03/05/21 1006 03/05/21 1152  BP: (!) 180/90 (!) 177/89 (!) 169/76 (!) 183/81  Pulse: 69 65 66 68  Resp:  '16 16 16  '$ Temp:  (!) 96.8 F (36 C) 98.1 F (36.7 C) 98.2 F (36.8 C)  TempSrc:  Axillary Oral   SpO2:  99%  97%  Weight:      Height:        Intake/Output Summary (Last 24 hours) at 03/05/2021 1159 Last data filed at 03/05/2021 0820 Gross per 24 hour  Intake 680 ml  Output --  Net 680 ml   Filed Weights   03/04/21 2016 03/05/21 0624  Weight: 60.3 kg 61.2 kg    Examination:  General exam: Frail elderly lady, appears calm and comfortable  Respiratory system: Clear to auscultation. Respiratory effort normal. Cardiovascular system: S1 & S2 heard, RRR.  Gastrointestinal system: Soft, nontender, nondistended, bowel sounds positive. Central nervous system: Alert and oriented. No focal neurological deficits. Extremities: No edema, no cyanosis, pulses intact and symmetrical. Psychiatry: Judgement and insight appear impaired.   DVT prophylaxis: SCDs Code Status: Full Family Communication: Son was updated at bedside Disposition Plan:  Status is: Inpatient  Remains inpatient appropriate because:Inpatient level of care appropriate due to severity of illness  Dispo: The patient is from: Home              Anticipated d/c is to: Home              Patient currently is not medically stable to d/c.   Difficult to place patient No              Level of care: Progressive Cardiac  All the records are reviewed and case discussed with Care Management/Social Worker. Management plans discussed with the patient, nursing and they are in agreement.  Consultants:  GI  Procedures:  Antimicrobials:   Data Reviewed: I have personally reviewed following labs and imaging studies  CBC: Recent Labs  Lab 03/04/21 2023  WBC 4.7  NEUTROABS 2.9  HGB 3.9*  HCT 14.0*  MCV 65.7*  PLT [id number]   Basic Metabolic Panel: Recent Labs   Lab 03/04/21 2023  NA 139  K 3.6  CL 110  CO2 22  GLUCOSE 154*  BUN 12  CREATININE 0.83  CALCIUM 8.8*  MG 2.0   GFR: Estimated Creatinine Clearance: 46.1 mL/min (by C-G formula based on SCr of 0.83 mg/dL). Liver Function Tests: Recent Labs  Lab 03/04/21 2023  AST 17  ALT 14  ALKPHOS 56  BILITOT 0.4  PROT 6.5  ALBUMIN 3.7   No results for input(s): LIPASE, AMYLASE in the last 168 hours. No results for input(s): AMMONIA in the last 168 hours. Coagulation Profile: No results for input(s): INR, PROTIME in the last 168 hours. Cardiac Enzymes: No results for input(s): CKTOTAL, CKMB, CKMBINDEX, TROPONINI in the last 168 hours. BNP (last 3 results) No results for input(s): PROBNP in the last 8760 hours. HbA1C: No results for input(s): HGBA1C in the last 72 hours. CBG: Recent  Labs  Lab 03/05/21 0339 03/05/21 1110 03/05/21 1148  GLUCAP 118* 62* 103*   Lipid Profile: No results for input(s): CHOL, HDL, LDLCALC, TRIG, CHOLHDL, LDLDIRECT in the last 72 hours. Thyroid Function Tests: No results for input(s): TSH, T4TOTAL, FREET4, T3FREE, THYROIDAB in the last 72 hours. Anemia Panel: Recent Labs    03/04/21 2023  TIBC 456*  IRON 107   Sepsis Labs: Recent Labs  Lab 03/04/21 2023  PROCALCITON <0.10    Recent Results (from the past 240 hour(s))  Resp Panel by RT-PCR (Flu A&B, Covid) Nasopharyngeal Swab     Status: None   Collection Time: 03/04/21  8:43 PM   Specimen: Nasopharyngeal Swab; Nasopharyngeal(NP) swabs in vial transport medium  Result Value Ref Range Status   SARS Coronavirus 2 by RT PCR NEGATIVE NEGATIVE Final    Comment: (NOTE) SARS-CoV-2 target nucleic acids are NOT DETECTED.  The SARS-CoV-2 RNA is generally detectable in upper respiratory specimens during the acute phase of infection. The lowest concentration of SARS-CoV-2 viral copies this assay can detect is 138 copies/mL. A negative result does not preclude SARS-Cov-2 infection and should not  be used as the sole basis for treatment or other patient management decisions. A negative result may occur with  improper specimen collection/handling, submission of specimen other than nasopharyngeal swab, presence of viral mutation(s) within the areas targeted by this assay, and inadequate number of viral copies(<138 copies/mL). A negative result must be combined with clinical observations, patient history, and epidemiological information. The expected result is Negative.  Fact Sheet for Patients:  EntrepreneurPulse.com.au  Fact Sheet for Healthcare Providers:  IncredibleEmployment.be  This test is no t yet approved or cleared by the [place] FDA and  has been authorized for detection and/or diagnosis of SARS-CoV-2 by FDA under an Emergency Use Authorization (EUA). This EUA will remain  in effect (meaning this test can be used) for the duration of the COVID-19 declaration under Section 564(b)(1) of the Act, 21 U.S.C.section 360bbb-3(b)(1), unless the authorization is terminated  or revoked sooner.       Influenza A by PCR NEGATIVE NEGATIVE Final   Influenza B by PCR NEGATIVE NEGATIVE Final    Comment: (NOTE) The Xpert Xpress SARS-CoV-2/FLU/RSV plus assay is intended as an aid in the diagnosis of influenza from Nasopharyngeal swab specimens and should not be used as a sole basis for treatment. Nasal washings and aspirates are unacceptable for Xpert Xpress SARS-CoV-2/FLU/RSV testing.  Fact Sheet for Patients: EntrepreneurPulse.com.au  Fact Sheet for Healthcare Providers: IncredibleEmployment.be  This test is not yet approved or cleared by the [place] FDA and has been authorized for detection and/or diagnosis of SARS-CoV-2 by FDA under an Emergency Use Authorization (EUA). This EUA will remain in effect (meaning this test can be used) for the duration of the COVID-19 declaration under Section  564(b)(1) of the Act, 21 U.S.C. section 360bbb-3(b)(1), unless the authorization is terminated or revoked.  Performed at [facility], [address], [place], [place] [address]      Radiology Studies: DG Chest 2 View  Result Date: 03/04/2021 CLINICAL DATA:  Dyspnea. Fatigue. [place]. EXAM: CHEST - 2 VIEW COMPARISON:  11/24/2008 FINDINGS: The heart size and mediastinal contours are within normal limits. Aortic atherosclerotic calcification noted. Both lungs are clear. The visualized skeletal structures are unremarkable. IMPRESSION: No active cardiopulmonary disease. Electronically Signed   By: Marlaine Hind M.D.   On: 03/04/2021 21:58   CT Head Wo Contrast  Result Date: 03/04/2021 CLINICAL DATA:  Neuro deficit, acute, stroke suspected Episodes of slurred speech today, now resolved. EXAM: CT HEAD WITHOUT CONTRAST TECHNIQUE: Contiguous axial images were obtained from the base of the skull through the vertex without intravenous contrast. COMPARISON:  Head CT 10/31/2020 FINDINGS: Brain: No intracranial hemorrhage, mass effect, or midline shift. No hydrocephalus. The basilar cisterns are patent. Similar periventricular and deep chronic small vessel ischemia to prior exam. No evidence of territorial infarct or acute ischemia. No extra-axial or intracranial fluid collection. Vascular: Atherosclerosis of skullbase vasculature without hyperdense vessel or abnormal calcification. Skull: No fracture or focal lesion. Sinuses/Orbits: None acute, allowing for motion artifact. Other: None. IMPRESSION: 1. No acute intracranial abnormality. 2. Unchanged chronic small vessel ischemia. Electronically Signed   By: Keith Rake M.D.   On: 03/04/2021 22:02   MR BRAIN WO CONTRAST  Result Date: 03/05/2021 CLINICAL DATA:  Initial evaluation for acute TIA. EXAM: MRI HEAD WITHOUT CONTRAST TECHNIQUE: Multiplanar, multiecho pulse sequences of the brain and surrounding structures were obtained without  intravenous contrast. COMPARISON:  CT from 03/04/2021 and CTA from 11/03/2008. Major intracranial vascular flow voids are otherwise maintained. FINDINGS: Brain: Examination moderately degraded by motion artifact. Generalized age-related cerebral atrophy. Relatively mild chronic microvascular ischemic disease noted involving the periventricular deep white matter both cerebral hemispheres. No abnormal foci of restricted diffusion to suggest acute or subacute ischemia. Gray-white matter differentiation maintained. No encephalomalacia to suggest chronic cortical infarction. No foci of susceptibility artifact to suggest acute or chronic intracranial hemorrhage. No mass lesion, midline shift or mass effect. Mild ventricular prominence related to global parenchymal volume loss without hydrocephalus. No extra-axial fluid collection. Pituitary gland and suprasellar region within normal limits. Midline structures intact. Vascular: Abnormal flow void within the left ICA to the siphon, consistent with chronic left ICA occlusion Skull and upper cervical spine: Craniocervical junction within normal limits. Bone marrow signal intensity normal. No scalp soft tissue abnormality. Sinuses/Orbits: Prior bilateral ocular lens replacement. Paranasal sinuses are largely clear. Small left with trace right mastoid effusions, of doubtful significance. Inner ear structures grossly normal. Visualized nasopharynx unremarkable. Other: None. IMPRESSION: 1. No acute intracranial abnormality. 2. Generalized age-related cerebral atrophy with mild chronic small vessel ischemic disease. 3. Chronic left ICA occlusion. Electronically Signed   By: Jeannine Boga M.D.   On: 03/05/2021 03:56   CT Angio Abd/Pel W and/or Wo Contrast  Result Date: 03/04/2021 CLINICAL DATA:  GI bleed.  Fatigue. EXAM: CTA ABDOMEN AND PELVIS WITHOUT AND WITH CONTRAST TECHNIQUE: Multidetector CT imaging of the abdomen and pelvis was performed using the standard protocol  during bolus administration of intravenous contrast. Multiplanar reconstructed images and MIPs were obtained and reviewed to evaluate the vascular anatomy. CONTRAST:  140m OMNIPAQUE IOHEXOL 350 MG/ML SOLN COMPARISON:  None. FINDINGS: VASCULAR Aorta: Normal caliber aorta without aneurysm, dissection, vasculitis or significant stenosis. Moderate atherosclerosis. Celiac: Plaque at the origin of the celiac artery causes minimal stenosis. Patent distal branches. No dissection or acute findings. SMA: Stented proximal SMA, stent is patent. Distal branches are patent. No dissection or acute findings. Renals: Plaque at the origin of both renal arteries causing mild stenosis, right greater than left. No dissection or acute findings. IMA: Patent. Inflow: Patent without evidence of aneurysm, dissection, vasculitis or significant stenosis. Moderate atheromatous plaque. Proximal Outflow: Bilateral common femoral and visualized portions of the superficial and profunda femoral arteries are patent without evidence of aneurysm, dissection, vasculitis or significant stenosis. Veins: Venous phase imaging demonstrates patent iliac system and IVC. Patent portal, splenic, and mesenteric veins. No  acute venous findings. Review of the MIP images confirms the above findings. NON-VASCULAR Lower chest: Cardiomegaly. Linear atelectasis in both lower lobes. Decreased density of the blood pool suggesting anemia. Hepatobiliary: No focal liver abnormality is seen. No gallstones, gallbladder wall thickening, or biliary dilatation. Pancreas: No ductal dilatation or inflammation. Spleen: Normal in size without focal abnormality. Adrenals/Urinary Tract: Normal adrenal glands. No hydronephrosis. 8 mm nonobstructing stone in the upper right kidney. 6.3 cm central left renal cyst. Decompressed ureters. Minimally distended urinary bladder. Stomach/Bowel: Fluid distended stomach. Small hiatal hernia with fluid in the distal esophagus and mild distal  esophageal wall thickening. There is no extravasation of contrast in the GI tract to localize site of GI bleed. Unremarkable small bowel. Moderate stool burden throughout the colon. Sigmoid colon is redundant. Minimal sigmoid diverticulosis. No diverticulitis. No colonic inflammation. Normal appendix. Lymphatic: No enlarged lymph nodes in the abdomen or pelvis. Reproductive: Enlarged lobulated uterus with multiple calcified fibroids. Ovaries are not discretely seen. Other: No free air or free fluid. Tiny fat containing umbilical hernia. Musculoskeletal: Degenerative change in the spine and both hips. There are no acute or suspicious osseous abnormalities. IMPRESSION: 1. No accumulation of contrast in the GI tract to localize site of GI bleed. 2. Moderate aortic atherosclerosis. Moderate branch atherosclerosis. SMA stent which is patent. 3. Moderate diffuse colonic stool burden with colonic redundancy. Minimal sigmoid diverticulosis without diverticulitis. 4. Small hiatal hernia with fluid in the distal esophagus and mild distal esophageal wall thickening, can be seen with reflux or esophagitis. There is ingested material distending the stomach. 5. Right nonobstructing renal stone. 6. Enlarged lobulated uterus with multiple calcified fibroids. Aortic Atherosclerosis (ICD10-I70.0). Electronically Signed   By: Keith Rake M.D.   On: 03/04/2021 22:48    Scheduled Meds:  sodium chloride   Intravenous Once   atorvastatin  20 mg Oral Daily   dextrose       donepezil  5 mg Oral QHS   insulin aspart  0-9 Units Subcutaneous Q4H   pantoprazole (PROTONIX) IV  40 mg Intravenous Q12H   Continuous Infusions:  sodium chloride Stopped (03/04/21 2323)     LOS: 1 day   Time spent: 40 minutes. More than 50% of the time was spent in counseling/coordination of care  Lorella Nimrod, MD [facility]  If 7PM-7AM, please contact night-coverage [email or web address]  03/05/2021, 11:59 AM   This record has been  created using Systems analyst. Errors have been sought and corrected,but may not always be located. Such creation errors do not reflect on the standard of care.

## 2021-03-06 ENCOUNTER — Other Ambulatory Visit: Payer: Self-pay

## 2021-03-06 DIAGNOSIS — E440 Moderate protein-calorie malnutrition: Secondary | ICD-10-CM | POA: Diagnosis not present

## 2021-03-06 DIAGNOSIS — Z8711 Personal history of peptic ulcer disease: Secondary | ICD-10-CM | POA: Diagnosis not present

## 2021-03-06 DIAGNOSIS — F0280 Dementia in other diseases classified elsewhere without behavioral disturbance: Secondary | ICD-10-CM | POA: Diagnosis not present

## 2021-03-06 DIAGNOSIS — I10 Essential (primary) hypertension: Secondary | ICD-10-CM | POA: Diagnosis not present

## 2021-03-06 DIAGNOSIS — Z882 Allergy status to sulfonamides status: Secondary | ICD-10-CM | POA: Diagnosis not present

## 2021-03-06 DIAGNOSIS — E119 Type 2 diabetes mellitus without complications: Secondary | ICD-10-CM | POA: Diagnosis not present

## 2021-03-06 DIAGNOSIS — Z79899 Other long term (current) drug therapy: Secondary | ICD-10-CM | POA: Diagnosis not present

## 2021-03-06 DIAGNOSIS — Z87891 Personal history of nicotine dependence: Secondary | ICD-10-CM | POA: Diagnosis not present

## 2021-03-06 DIAGNOSIS — D649 Anemia, unspecified: Secondary | ICD-10-CM | POA: Diagnosis not present

## 2021-03-06 DIAGNOSIS — Z833 Family history of diabetes mellitus: Secondary | ICD-10-CM | POA: Diagnosis not present

## 2021-03-06 DIAGNOSIS — D62 Acute posthemorrhagic anemia: Secondary | ICD-10-CM | POA: Diagnosis not present

## 2021-03-06 DIAGNOSIS — Z20822 Contact with and (suspected) exposure to covid-19: Secondary | ICD-10-CM | POA: Diagnosis not present

## 2021-03-06 DIAGNOSIS — E039 Hypothyroidism, unspecified: Secondary | ICD-10-CM | POA: Diagnosis not present

## 2021-03-06 DIAGNOSIS — R4781 Slurred speech: Secondary | ICD-10-CM | POA: Diagnosis present

## 2021-03-06 DIAGNOSIS — G301 Alzheimer's disease with late onset: Secondary | ICD-10-CM | POA: Diagnosis not present

## 2021-03-06 DIAGNOSIS — K551 Chronic vascular disorders of intestine: Secondary | ICD-10-CM | POA: Diagnosis not present

## 2021-03-06 DIAGNOSIS — Z8249 Family history of ischemic heart disease and other diseases of the circulatory system: Secondary | ICD-10-CM | POA: Diagnosis not present

## 2021-03-06 DIAGNOSIS — Z79890 Hormone replacement therapy (postmenopausal): Secondary | ICD-10-CM | POA: Diagnosis not present

## 2021-03-06 DIAGNOSIS — Z7982 Long term (current) use of aspirin: Secondary | ICD-10-CM | POA: Diagnosis not present

## 2021-03-06 DIAGNOSIS — M810 Age-related osteoporosis without current pathological fracture: Secondary | ICD-10-CM | POA: Diagnosis not present

## 2021-03-06 DIAGNOSIS — Z7984 Long term (current) use of oral hypoglycemic drugs: Secondary | ICD-10-CM | POA: Diagnosis not present

## 2021-03-06 DIAGNOSIS — D509 Iron deficiency anemia, unspecified: Secondary | ICD-10-CM

## 2021-03-06 DIAGNOSIS — Z7902 Long term (current) use of antithrombotics/antiplatelets: Secondary | ICD-10-CM | POA: Diagnosis not present

## 2021-03-06 LAB — BASIC METABOLIC PANEL
Anion gap: 7 (ref 5–15)
BUN: 7 mg/dL — ABNORMAL LOW (ref 8–23)
CO2: 24 mmol/L (ref 22–32)
Calcium: 8.6 mg/dL — ABNORMAL LOW (ref 8.9–10.3)
Chloride: 108 mmol/L (ref 98–111)
Creatinine, Ser: 0.77 mg/dL (ref 0.44–1.00)
GFR, Estimated: >60 mL/min (ref >60)
Glucose, Bld: 92 mg/dL (ref 70–99)
Potassium: 3.4 mmol/L — ABNORMAL LOW (ref 3.5–5.1)
Sodium: 139 mmol/L (ref 135–145)

## 2021-03-06 LAB — CBC
HCT: 31.5 % — ABNORMAL LOW (ref 36.0–46.0)
HCT: 29.7 % — ABNORMAL LOW (ref 36.0–46.0)
Hemoglobin: 10.2 g/dL — ABNORMAL LOW (ref 12.0–15.0)
Hemoglobin: 9.6 g/dL — ABNORMAL LOW (ref 12.0–15.0)
MCH: 24.8 pg — ABNORMAL LOW (ref 26.0–34.0)
MCH: 25.5 pg — ABNORMAL LOW (ref 26.0–34.0)
MCHC: 32.4 g/dL (ref 30.0–36.0)
MCHC: 32.3 g/dL (ref 30.0–36.0)
MCV: 76.6 fL — ABNORMAL LOW (ref 80.0–100.0)
MCV: 79 fL — ABNORMAL LOW (ref 80.0–100.0)
Platelets: 326 10*3/uL (ref 150–400)
Platelets: 297 10*3/uL (ref 150–400)
RBC: 4.11 MIL/uL (ref 3.87–5.11)
RBC: 3.76 MIL/uL — ABNORMAL LOW (ref 3.87–5.11)
RDW: 22.4 % — ABNORMAL HIGH (ref 11.5–15.5)
RDW: 22.9 % — ABNORMAL HIGH (ref 11.5–15.5)
WBC: 4.9 10*3/uL (ref 4.0–10.5)
WBC: 6.3 10*3/uL (ref 4.0–10.5)
nRBC: 0.4 % — ABNORMAL HIGH (ref 0.0–0.2)
nRBC: 0.3 % — ABNORMAL HIGH (ref 0.0–0.2)

## 2021-03-06 LAB — GLUCOSE, CAPILLARY
Glucose-Capillary: 82 mg/dL (ref 70–99)
Glucose-Capillary: 96 mg/dL (ref 70–99)
Glucose-Capillary: 151 mg/dL — ABNORMAL HIGH (ref 70–99)
Glucose-Capillary: 136 mg/dL — ABNORMAL HIGH (ref 70–99)
Glucose-Capillary: 173 mg/dL — ABNORMAL HIGH (ref 70–99)

## 2021-03-06 MED ORDER — ADULT MULTIVITAMIN W/MINERALS CH
1.0000 | ORAL_TABLET | Freq: Every day | ORAL | Status: DC
  Administered 2021-03-07: 09:00:00 1 via ORAL
  Filled 2021-03-06 (×2): qty 1

## 2021-03-06 MED ORDER — ACETAMINOPHEN 650 MG RE SUPP: 650.0000 mg | Freq: Four times a day (QID) | RECTAL | Status: DC | PRN

## 2021-03-06 MED ORDER — GLUCERNA SHAKE PO LIQD
237.0000 mL | Freq: Three times a day (TID) | ORAL | Status: DC
  Administered 2021-03-06 – 2021-03-07 (×2): 237 mL via ORAL

## 2021-03-06 MED ORDER — NA SULFATE-K SULFATE-MG SULF 17.5-3.13-1.6 GM/177ML PO SOLN: 1.0000 | ORAL | 0 refills | Status: DC

## 2021-03-06 MED ORDER — ACETAMINOPHEN 325 MG PO TABS: 650.0000 mg | ORAL_TABLET | Freq: Four times a day (QID) | ORAL | Status: DC | PRN

## 2021-03-06 MED ORDER — POTASSIUM CHLORIDE CRYS ER 20 MEQ PO TBCR
20.0000 meq | EXTENDED_RELEASE_TABLET | Freq: Once | ORAL | Status: AC
  Administered 2021-03-06: 20 meq via ORAL
  Filled 2021-03-06: qty 1

## 2021-03-06 NOTE — Evaluation (Signed)
Physical Therapy Evaluation Patient Details Name: Janet Gross MRN: [id number] DOB: 1930-10-06 Today's Date: 03/06/2021   History of Present Illness  87 y.o. female with medical history significant for DM, HTN, dementia, chronic mesenteric ischemia status post SMA stent. Pt was brought to the emergency room by her son after 2 episodes of slurred speech at home lasting 3 to 4 minutes.  Patient returned to baseline after the episodes. CT head and MRI brain negative for acute abnormality. Pt admitted for symptomatic anemia. Possibility of TIA in setting of low Hb.  Clinical Impression  Patient pleasantly confused but able to follow commands consistently and participate with PT evaluation. Patient is impulsive at times with activity and requires cues for safety. Patient ambulated a lap around hallway with rolling walker and requires cues for safety, to decrease cadence, and cues for proper rolling walker use. Patient also ambulated a short distance without assistive device without loss of balance. Given history of dementia and no notable difference in gait pattern/safety, rolling walker is not recommended at this time.  Anticipate patient is nearing baseline level of functional mobility. Recommend to return to previous living arrangement with 24 hour caregivers.      Follow Up Recommendations Supervision/Assistance - 24 hour (due to baseline cognitive concerns)    Equipment Recommendations  None recommended by PT    Recommendations for Other Services       Precautions / Restrictions Precautions Precautions: Fall Restrictions Weight Bearing Restrictions: No      Mobility  Bed Mobility Overal bed mobility: Modified Independent Bed Mobility: Supine to Sit;Sit to Supine     Supine to sit: Supervision Sit to supine: Supervision   General bed mobility comments: 5strengthhipadd/abd,4+/5kneeextension,/5dorsiflexion/plantarflexioncv    Transfers Overall transfer level: Needs  assistance Equipment used: None Transfers: Sit to/from Stand Sit to Stand: Supervision         General transfer comment: patient able to stand without physical assistance from multiple surfaces including bed and toilet. supervision and cues for safety. patient is mildly impulsive with activity  Ambulation/Gait Ambulation/Gait assistance: Supervision Gait Distance (Feet): 200 Feet Assistive device:  (with and without rolling walker) Gait Pattern/deviations: Step-through pattern Gait velocity: fast cadence   General Gait Details: patient ambulated the lap around nursing station using rolling walker with cues on how to use rolling walker properly. no loss of balance, however patient needed verbal cues to decrease cadence for safety. patient ambulated a short distance in room (less than 20 ft) without rolling walker with no loss of balance. no change in gait pattern noted with or without rolling walker. given history of dementia and no notable difference in gait pattern/safety, rolling walker is not recommended at this time  Stairs            Wheelchair Mobility    Modified Rankin (Stroke Patients Only)       Balance Overall balance assessment: Needs assistance Sitting-balance support: Feet supported;No upper extremity supported Sitting balance-Leahy Scale: Good Sitting balance - Comments: good balance with reaching outside base of support   Standing balance support: No upper extremity supported Standing balance-Leahy Scale: Fair Standing balance comment: patient able to maintain standing balance without support                             Pertinent Vitals/Pain Pain Assessment: No/denies pain    Home Living Family/patient expects to be discharged to:: Private residence Living Arrangements: Other (Comment) (patient has 24 hour caregivers  at home) Available Help at Discharge: Personal care attendant;Available 24 hours/day Type of Home: House Home Access:  Level entry     Home Layout: One level Home Equipment: None Additional Comments: daughter in law anticipates getting a grab bar for the shower and a shower seat installed soon    Prior Function Level of Independence: Needs assistance   Gait / Transfers Assistance Needed: supervision from caregivers for ambulation without assistive device  ADL's / Homemaking Assistance Needed: supervision for most ADLs  Comments: patient has 24 hour caregivers at home     Hand Dominance   Dominant Hand: Right    Extremity/Trunk Assessment   Upper Extremity Assessment Upper Extremity Assessment: Overall WFL for tasks assessed    Lower Extremity Assessment Lower Extremity Assessment: RLE deficits/detail;LLE deficits/detail RLE Deficits / Details: 5/5 strength hip add/abd, 4+/5 knee extension, 5/5 dorsiflexion/plantarflexion LLE Deficits / Details: 5/5 strength hip add/abd, 4+/5 knee extension, 5/5 dorsiflexion/plantarflexion    Cervical / Trunk Assessment Cervical / Trunk Assessment: Normal  Communication   Communication: No difficulties  Cognition Arousal/Alertness: Awake/alert Behavior During Therapy: WFL for tasks assessed/performed Overall Cognitive Status: History of cognitive impairments - at baseline                                 General Comments: patient is able to follow single step commands consistently. she needs cues for safety with mobility due to imlusive behavior      General Comments      Exercises     Assessment/Plan    PT Assessment Patient needs continued PT services  PT Problem List Decreased strength;Decreased activity tolerance;Decreased balance;Decreased mobility;Decreased safety awareness;Decreased cognition;Decreased knowledge of use of DME       PT Treatment Interventions DME instruction;Gait training;Stair training;Functional mobility training;Therapeutic activities;Therapeutic exercise;Balance training;Neuromuscular  re-education;Cognitive remediation;Patient/family education    PT Goals (Current goals can be found in the Care Plan section)  Acute Rehab PT Goals Patient Stated Goal: to go home PT Goal Formulation: With patient/family Time For Goal Achievement: 03/20/21 Potential to Achieve Goals: Good Additional Goals Additional Goal #1: patient will maintain standing balance, Mod I with no UE support, while challeged outside base of support during functional tasks to decrease fall risk    Frequency Min 2X/week   Barriers to discharge        Co-evaluation               AM-PAC PT "6 Clicks" Mobility  Outcome Measure Help needed turning from your back to your side while in a flat bed without using bedrails?: None Help needed moving from lying on your back to sitting on the side of a flat bed without using bedrails?: A Little Help needed moving to and from a bed to a chair (including a wheelchair)?: A Little Help needed standing up from a chair using your arms (e.g., wheelchair or bedside chair)?: A Little Help needed to walk in hospital room?: A Little Help needed climbing 3-5 steps with a railing? : A Little 6 Click Score: 19    End of Session Equipment Utilized During Treatment: Gait belt Activity Tolerance: Patient tolerated treatment well Patient left: in bed;with call bell/phone within reach;with bed alarm set;with family/visitor present Nurse Communication: Mobility status PT Visit Diagnosis: Muscle weakness (generalized) (M62.81)    Time: [id number] PT Time Calculation (min) (ACUTE ONLY): 25 min   Charges:   PT Evaluation $PT Eval Moderate Complexity: 1 Mod PT  Treatments $Gait Training: 8-22 mins        Minna Merritts, PT, MPT   Percell Locus 03/06/2021, 2:55 PM

## 2021-03-06 NOTE — Progress Notes (Signed)
PROGRESS NOTE    Janet Gross  [id number] DOB: Sep 23, 1930 DOA: 03/04/2021 PCP: Baxter Hire, MD   Brief Narrative: Taken from H&P. Janet Gross is a 87 y.o. female with medical history significant for DM, HTN, dementia, chronic mesenteric ischemia status post SMA stent on DAPT, with history of iron deficiency anemia and upper and lower endoscopy in 2018 showing mild reactive gastropathy on EGD and tubular adenomas and hyperplastic polyps on colonoscopy who was brought to the emergency room by her son after 2 episodes of slurred speech at home lasting 3 to 4 minutes.  Patient returned to baseline after the episodes.  Most of the history is given by her son at the bedside, according to him her mother was pretty active approximately 3 months ago.  Since May she developed worsening fatigue and exertional dyspnea.  She had PCI to SMA done in April 2022 for critical mesenteric ischemia and was placed on DAPT.  Patient has underlying dementia and cannot tell me if she has experiencing black/tarry color stools or not.  Son has not seen any obvious bleeding.  This saw the PCP last week with similar complaints and apparently have some blood work-up, son was not sure about it but he was told by the doctor that blood work-up looks okay, on care everywhere she just had BMP and A1c which was within normal limit.  Cannot see any CBC done.  Prior CBC in March with hemoglobin of 13.  She was found to have hemoglobin of 3.9 when came to ED.  FOBT positive. Received total of 4 units of PRBC. GI was consulted-they will proceed with scopes after washing out of aspirin and Plavix. Aspirin and Plavix is on hold-discussed with vascular and according to them that can be held until she stabilizes from possible GI bleed.  Hemoglobin stable after getting 4 units of PRBC.  Patient will need 5 days of Plavix washout, last dose on Friday.  Subjective: Patient was seen and examined today.  No new complaints.   Appears little more somnolent but easily arousable and answering questions appropriately.  Daughter-in-law at bedside. They were wondering if patient can be discharged home and come for outpatient EGD as she needs total of 5 days of Plavix washout.  Assessment & Plan:   Principal Problem:   Symptomatic anemia Active Problems:   Controlled type 2 diabetes mellitus without complication (HCC)   Adult hypothyroidism   Essential hypertension   Late onset Alzheimer's disease without behavioral disturbance (HCC)   Chronic mesenteric ischemia (HCC)   Slurred speech  Symptomatic anemia.  Most likely secondary to GI losses as patient's FOBT was positive.  No obvious bleeding.  Worsening fatigue and exertional dyspnea for about 20-month since May.  No recorded CBC since then, CBC done in March was 13.  She was started on DAPT in April 2022 after getting PCI to SMA for mesenteric ischemia. GI was consulted and they will wait for Plavix washout. Discussed with vascular surgery and adjust okay to hold aspirin and Plavix till her bleed stabilizes. Received total of 4 units of PRBC.  Hemoglobin at 10.2 today. -Will appreciate GI input regarding proceeding as an outpatient versus inpatient. -Monitor hemoglobin -Transfuse below 7.  Slurred speech.  There was some questionable transient slurred speech recently which lasted for few minutes.  TIA can be a possibility as patient had very low hemoglobin.  CT head and MRI brain was negative for any acute abnormality. -Holding aspirin and Plavix -Continue statin  Chronic mesenteric ischemia status post SMA stent - Holding aspirin and Plavix due to GI bleed     Controlled type 2 diabetes mellitus without complication (HCC) - Sliding scale insulin coverage     Adult hypothyroidism - Continue levothyroxine     Essential hypertension.  Blood pressure trending up.  Initially home dose of losartan was held due to softer blood pressure. -Continue home meds    Late onset Alzheimer's disease without behavioral disturbance (HCC) - Stable -Continue home dose of Aricept and Namenda  Objective: Vitals:   03/06/21 0423 03/06/21 0752 03/06/21 1245 03/06/21 1541  BP: (!) 142/70 140/69 (!) 146/67 (!) 161/64  Pulse: (!) 59 60 (!) 58 61  Resp: '16 16 18 18  '$ Temp: 98.4 F (36.9 C) 97.7 F (36.5 C) (!) 97.4 F (36.3 C) 98.5 F (36.9 C)  TempSrc: Oral Oral Oral   SpO2: 100% 100% 100% 100%  Weight:      Height:        Intake/Output Summary (Last 24 hours) at 03/06/2021 1548 Last data filed at 03/06/2021 0950 Gross per 24 hour  Intake 1041 ml  Output --  Net 1041 ml    Filed Weights   03/04/21 2016 03/05/21 0624  Weight: 60.3 kg 61.2 kg    Examination:  General.  Chronically ill-appearing elderly lady, in no acute distress. Pulmonary.  Lungs clear bilaterally, normal respiratory effort. CV.  Regular rate and rhythm, no JVD, rub or murmur. Abdomen.  Soft, nontender, nondistended, BS positive. CNS.  Alert and oriented .  No focal neurologic deficit. Extremities.  No edema, no cyanosis, pulses intact and symmetrical. Psychiatry.  Judgment and insight appears impaired.  DVT prophylaxis: SCDs Code Status: Full Family Communication: DIL was updated at bedside Disposition Plan:  Status is: Inpatient  Remains inpatient appropriate because:Inpatient level of care appropriate due to severity of illness  Dispo: The patient is from: Home              Anticipated d/c is to: Home              Patient currently is not medically stable to d/c.   Difficult to place patient No              Level of care: Progressive Cardiac  All the records are reviewed and case discussed with Care Management/Social Worker. Management plans discussed with the patient, nursing and they are in agreement.  Consultants:  GI  Procedures:  Antimicrobials:   Data Reviewed: I have personally reviewed following labs and imaging studies  CBC: Recent Labs  Lab  03/04/21 2023 03/05/21 1048 03/05/21 1951 03/06/21 0631  WBC 4.7 7.5 6.2 4.9  NEUTROABS 2.9  --   --   --   HGB 3.9* 7.5* 9.1* 10.2*  HCT 14.0* 24.5* 28.2* 31.5*  MCV 65.7* 74.2* 76.4* 76.6*  PLT 368 290 357 [id number]    Basic Metabolic Panel: Recent Labs  Lab 03/04/21 2023 03/06/21 0631  NA 139 139  K 3.6 3.4*  CL 110 108  CO2 22 24  GLUCOSE 154* 92  BUN 12 7*  CREATININE 0.83 0.77  CALCIUM 8.8* 8.6*  MG 2.0  --     GFR: Estimated Creatinine Clearance: 47.9 mL/min (by C-G formula based on SCr of 0.77 mg/dL). Liver Function Tests: Recent Labs  Lab 03/04/21 2023  AST 17  ALT 14  ALKPHOS 56  BILITOT 0.4  PROT 6.5  ALBUMIN 3.7    No results for input(s): LIPASE, AMYLASE  in the last 168 hours. No results for input(s): AMMONIA in the last 168 hours. Coagulation Profile: No results for input(s): INR, PROTIME in the last 168 hours. Cardiac Enzymes: No results for input(s): CKTOTAL, CKMB, CKMBINDEX, TROPONINI in the last 168 hours. BNP (last 3 results) No results for input(s): PROBNP in the last 8760 hours. HbA1C: Recent Labs    03/05/21 1048  HGBA1C 5.7*   CBG: Recent Labs  Lab 03/05/21 2004 03/06/21 0424 03/06/21 0753 03/06/21 1247 03/06/21 1542  GLUCAP 132* 82 96 151* 136*    Lipid Profile: No results for input(s): CHOL, HDL, LDLCALC, TRIG, CHOLHDL, LDLDIRECT in the last 72 hours. Thyroid Function Tests: No results for input(s): TSH, T4TOTAL, FREET4, T3FREE, THYROIDAB in the last 72 hours. Anemia Panel: Recent Labs    03/04/21 2023 03/05/21 1048  TIBC 456*  --   IRON 107  --   RETICCTPCT  --  2.1    Sepsis Labs: Recent Labs  Lab 03/04/21 2023  PROCALCITON <0.10     Recent Results (from the past 240 hour(s))  Resp Panel by RT-PCR (Flu A&B, Covid) Nasopharyngeal Swab     Status: None   Collection Time: 03/04/21  8:43 PM   Specimen: Nasopharyngeal Swab; Nasopharyngeal(NP) swabs in vial transport medium  Result Value Ref Range Status    SARS Coronavirus 2 by RT PCR NEGATIVE NEGATIVE Final    Comment: (NOTE) SARS-CoV-2 target nucleic acids are NOT DETECTED.  The SARS-CoV-2 RNA is generally detectable in upper respiratory specimens during the acute phase of infection. The lowest concentration of SARS-CoV-2 viral copies this assay can detect is 138 copies/mL. A negative result does not preclude SARS-Cov-2 infection and should not be used as the sole basis for treatment or other patient management decisions. A negative result may occur with  improper specimen collection/handling, submission of specimen other than nasopharyngeal swab, presence of viral mutation(s) within the areas targeted by this assay, and inadequate number of viral copies(<138 copies/mL). A negative result must be combined with clinical observations, patient history, and epidemiological information. The expected result is Negative.  Fact Sheet for Patients:  EntrepreneurPulse.com.au  Fact Sheet for Healthcare Providers:  IncredibleEmployment.be  This test is no t yet approved or cleared by the [place] FDA and  has been authorized for detection and/or diagnosis of SARS-CoV-2 by FDA under an Emergency Use Authorization (EUA). This EUA will remain  in effect (meaning this test can be used) for the duration of the COVID-19 declaration under Section 564(b)(1) of the Act, 21 U.S.C.section 360bbb-3(b)(1), unless the authorization is terminated  or revoked sooner.       Influenza A by PCR NEGATIVE NEGATIVE Final   Influenza B by PCR NEGATIVE NEGATIVE Final    Comment: (NOTE) The Xpert Xpress SARS-CoV-2/FLU/RSV plus assay is intended as an aid in the diagnosis of influenza from Nasopharyngeal swab specimens and should not be used as a sole basis for treatment. Nasal washings and aspirates are unacceptable for Xpert Xpress SARS-CoV-2/FLU/RSV testing.  Fact Sheet for  Patients: EntrepreneurPulse.com.au  Fact Sheet for Healthcare Providers: IncredibleEmployment.be  This test is not yet approved or cleared by the [place] FDA and has been authorized for detection and/or diagnosis of SARS-CoV-2 by FDA under an Emergency Use Authorization (EUA). This EUA will remain in effect (meaning this test can be used) for the duration of the COVID-19 declaration under Section 564(b)(1) of the Act, 21 U.S.C. section 360bbb-3(b)(1), unless the authorization is terminated or revoked.  Performed at [facility], [address], [place], [place] [address]       Radiology Studies: DG Chest 2 View  Result Date: 03/04/2021 CLINICAL DATA:  Dyspnea. Fatigue. [place]. EXAM: CHEST - 2 VIEW COMPARISON:  11/24/2008 FINDINGS: The heart size and mediastinal contours are within normal limits. Aortic atherosclerotic calcification noted. Both lungs are clear. The visualized skeletal structures are unremarkable. IMPRESSION: No active cardiopulmonary disease. Electronically Signed   By: Marlaine Hind M.D.   On: 03/04/2021 21:58   CT Head Wo Contrast  Result Date: 03/04/2021 CLINICAL DATA:  Neuro deficit, acute, stroke suspected Episodes of slurred speech today, now resolved. EXAM: CT HEAD WITHOUT CONTRAST TECHNIQUE: Contiguous axial images were obtained from the base of the skull through the vertex without intravenous contrast. COMPARISON:  Head CT 10/31/2020 FINDINGS: Brain: No intracranial hemorrhage, mass effect, or midline shift. No hydrocephalus. The basilar cisterns are patent. Similar periventricular and deep chronic small vessel ischemia to prior exam. No evidence of territorial infarct or acute ischemia. No extra-axial or intracranial fluid collection. Vascular: Atherosclerosis of skullbase vasculature without hyperdense vessel or abnormal calcification. Skull: No fracture or focal lesion. Sinuses/Orbits: None acute, allowing  for motion artifact. Other: None. IMPRESSION: 1. No acute intracranial abnormality. 2. Unchanged chronic small vessel ischemia. Electronically Signed   By: Keith Rake M.D.   On: 03/04/2021 22:02   MR BRAIN WO CONTRAST  Result Date: 03/05/2021 CLINICAL DATA:  Initial evaluation for acute TIA. EXAM: MRI HEAD WITHOUT CONTRAST TECHNIQUE: Multiplanar, multiecho pulse sequences of the brain and surrounding structures were obtained without intravenous contrast. COMPARISON:  CT from 03/04/2021 and CTA from 11/03/2008. Major intracranial vascular flow voids are otherwise maintained. FINDINGS: Brain: Examination moderately degraded by motion artifact. Generalized age-related cerebral atrophy. Relatively mild chronic microvascular ischemic disease noted involving the periventricular deep white matter both cerebral hemispheres. No abnormal foci of restricted diffusion to suggest acute or subacute ischemia. Gray-white matter differentiation maintained. No encephalomalacia to suggest chronic cortical infarction. No foci of susceptibility artifact to suggest acute or chronic intracranial hemorrhage. No mass lesion, midline shift or mass effect. Mild ventricular prominence related to global parenchymal volume loss without hydrocephalus. No extra-axial fluid collection. Pituitary gland and suprasellar region within normal limits. Midline structures intact. Vascular: Abnormal flow void within the left ICA to the siphon, consistent with chronic left ICA occlusion Skull and upper cervical spine: Craniocervical junction within normal limits. Bone marrow signal intensity normal. No scalp soft tissue abnormality. Sinuses/Orbits: Prior bilateral ocular lens replacement. Paranasal sinuses are largely clear. Small left with trace right mastoid effusions, of doubtful significance. Inner ear structures grossly normal. Visualized nasopharynx unremarkable. Other: None. IMPRESSION: 1. No acute intracranial abnormality. 2. Generalized  age-related cerebral atrophy with mild chronic small vessel ischemic disease. 3. Chronic left ICA occlusion. Electronically Signed   By: Jeannine Boga M.D.   On: 03/05/2021 03:56   CT Angio Abd/Pel W and/or Wo Contrast  Result Date: 03/04/2021 CLINICAL DATA:  GI bleed.  Fatigue. EXAM: CTA ABDOMEN AND PELVIS WITHOUT AND WITH CONTRAST TECHNIQUE: Multidetector CT imaging of the abdomen and pelvis was performed using the standard protocol during bolus administration of intravenous contrast. Multiplanar reconstructed images and MIPs were obtained and reviewed to evaluate the vascular anatomy. CONTRAST:  132m OMNIPAQUE IOHEXOL 350 MG/ML SOLN COMPARISON:  None. FINDINGS: VASCULAR Aorta: Normal caliber aorta without aneurysm, dissection, vasculitis or significant stenosis. Moderate atherosclerosis. Celiac: Plaque at the origin of the celiac artery causes minimal stenosis. Patent distal branches. No dissection or acute findings.  SMA: Stented proximal SMA, stent is patent. Distal branches are patent. No dissection or acute findings. Renals: Plaque at the origin of both renal arteries causing mild stenosis, right greater than left. No dissection or acute findings. IMA: Patent. Inflow: Patent without evidence of aneurysm, dissection, vasculitis or significant stenosis. Moderate atheromatous plaque. Proximal Outflow: Bilateral common femoral and visualized portions of the superficial and profunda femoral arteries are patent without evidence of aneurysm, dissection, vasculitis or significant stenosis. Veins: Venous phase imaging demonstrates patent iliac system and IVC. Patent portal, splenic, and mesenteric veins. No acute venous findings. Review of the MIP images confirms the above findings. NON-VASCULAR Lower chest: Cardiomegaly. Linear atelectasis in both lower lobes. Decreased density of the blood pool suggesting anemia. Hepatobiliary: No focal liver abnormality is seen. No gallstones, gallbladder wall  thickening, or biliary dilatation. Pancreas: No ductal dilatation or inflammation. Spleen: Normal in size without focal abnormality. Adrenals/Urinary Tract: Normal adrenal glands. No hydronephrosis. 8 mm nonobstructing stone in the upper right kidney. 6.3 cm central left renal cyst. Decompressed ureters. Minimally distended urinary bladder. Stomach/Bowel: Fluid distended stomach. Small hiatal hernia with fluid in the distal esophagus and mild distal esophageal wall thickening. There is no extravasation of contrast in the GI tract to localize site of GI bleed. Unremarkable small bowel. Moderate stool burden throughout the colon. Sigmoid colon is redundant. Minimal sigmoid diverticulosis. No diverticulitis. No colonic inflammation. Normal appendix. Lymphatic: No enlarged lymph nodes in the abdomen or pelvis. Reproductive: Enlarged lobulated uterus with multiple calcified fibroids. Ovaries are not discretely seen. Other: No free air or free fluid. Tiny fat containing umbilical hernia. Musculoskeletal: Degenerative change in the spine and both hips. There are no acute or suspicious osseous abnormalities. IMPRESSION: 1. No accumulation of contrast in the GI tract to localize site of GI bleed. 2. Moderate aortic atherosclerosis. Moderate branch atherosclerosis. SMA stent which is patent. 3. Moderate diffuse colonic stool burden with colonic redundancy. Minimal sigmoid diverticulosis without diverticulitis. 4. Small hiatal hernia with fluid in the distal esophagus and mild distal esophageal wall thickening, can be seen with reflux or esophagitis. There is ingested material distending the stomach. 5. Right nonobstructing renal stone. 6. Enlarged lobulated uterus with multiple calcified fibroids. Aortic Atherosclerosis (ICD10-I70.0). Electronically Signed   By: Keith Rake M.D.   On: 03/04/2021 22:48    Scheduled Meds:  sodium chloride   Intravenous Once   atorvastatin  20 mg Oral Daily   donepezil  5 mg Oral QHS    gabapentin  300 mg Oral QHS   insulin aspart  0-9 Units Subcutaneous Q4H   levothyroxine  100 mcg Oral Q0600   losartan  25 mg Oral Daily   memantine  10 mg Oral BID   pantoprazole (PROTONIX) IV  40 mg Intravenous Q12H   Continuous Infusions:  sodium chloride Stopped (03/04/21 2323)     LOS: 2 days   Time spent: 37 minutes. More than 50% of the time was spent in counseling/coordination of care  Lorella Nimrod, MD [facility]  If 7PM-7AM, please contact night-coverage [email or web address]  03/06/2021, 3:48 PM   This record has been created using Systems analyst. Errors have been sought and corrected,but may not always be located. Such creation errors do not reflect on the standard of care.

## 2021-03-06 NOTE — Progress Notes (Signed)
Initial Nutrition Assessment  DOCUMENTATION CODES:   Non-severe (moderate) malnutrition in context of chronic illness  INTERVENTION:   Glucerna Shake po TID, each supplement provides 220 kcal and 10 grams of protein  Magic cup TID with meals, each supplement provides 290 kcal and 9 grams of protein  MVI po daily  Pt at high refeed risk; recommend monitor potassium, magnesium and phosphorus labs daily until stable  Recommend checking B12, folate and vitamin D labs  NUTRITION DIAGNOSIS:   Moderate Malnutrition related to chronic illness (dementia, dysphagia) as evidenced by moderate fat depletion, moderate muscle depletion.  GOAL:   Patient will meet greater than or equal to 90% of their needs  MONITOR:   PO intake, Supplement acceptance, Labs, Weight trends, Skin, I & O's  REASON FOR ASSESSMENT:   Malnutrition Screening Tool    ASSESSMENT:   87 y.o. female with medical history significant for DM, HTN, dementia, chronic mesenteric ischemia status post SMA stent on DAPT, iron deficiency anemia, hiatal hernia, dysphagia and gastric ulcer who is admitted with anemia and GIB.  Met with pt in room today. Pt is unable to provide much nutrition related history r/t dementia so majority of history is provided by daughter-in-law at bedside. Pt with good appetite and oral intake at baseline but this has been decreasing over several years r/t pt's dementia per daughter-in-law. Pt does drink Glucerna supplements at home and pt loves ice cream. Pt has developed dysphagia and difficulty swallowing over the past 3-4 weeks per family report; pt has been eating mainly soft foods and has been spitting out food at times. Pt does have h/o hiatal hernia and esophageal stricture s/p dilation in 2013. RD will add supplements and vitamins to help pt meet her estimated needs. RD will also change pt to a mechanical soft diet. Per chart, pt is down 8lbs(6%) over the past 4 months; this is not significant.  Pt ate ~50% of her breakfast today and ate ~60% of her lunch. Family member reports that pt generally eats ~30-50% of her meals at home. Pt is likely at refeed risk. Per chart, plan is for EGD at some point to evaluate dysphagia. Of note, pt with h/o B12 deficiency; would recommend checking B12, folate and vitamin D.   Medications reviewed and include: insulin, synthroid, protonix  Labs reviewed: K 3.4(L) Iron 107, TIBC 456- 7/30 Hgb 10.2(L), Hct 31.5(L), MCV 76.6(L), MCH 24.8(L) Cbgs- 82, 96, 151, 136 x 24 hrs AIC 5.7(H)- 7/31  NUTRITION - FOCUSED PHYSICAL EXAM:  Flowsheet Row Most Recent Value  Orbital Region Mild depletion  Upper Arm Region Moderate depletion  Thoracic and Lumbar Region Moderate depletion  Buccal Region Mild depletion  Temple Region Mild depletion  Clavicle Bone Region Moderate depletion  Clavicle and Acromion Bone Region Moderate depletion  Scapular Bone Region Moderate depletion  Dorsal Hand Severe depletion  Patellar Region Severe depletion  Anterior Thigh Region Severe depletion  Posterior Calf Region Severe depletion  Edema (RD Assessment) None  Hair Reviewed  Eyes Reviewed  Mouth Reviewed  Skin Reviewed  Nails Reviewed   Diet Order:   Diet Order             Diet heart healthy/carb modified Room service appropriate? Yes; Fluid consistency: Thin  Diet effective now                  EDUCATION NEEDS:   Education needs have been addressed (with pt's family)  Skin:  Skin Assessment: Reviewed RN Assessment  Last  BM:  pta  Height:   Ht Readings from Last 1 Encounters:  03/05/21 5' 7.01" (1.702 m)    Weight:   Wt Readings from Last 1 Encounters:  03/05/21 61.2 kg    Ideal Body Weight:  61.36 kg  BMI:  Body mass index is 21.14 kg/m.  Estimated Nutritional Needs:   Kcal:  1500-1700kcal/day  Protein:  75-85g/day  Fluid:  1.5-1.7L/day  Koleen Distance MS, RD, LDN Please refer to [facility] for RD and/or RD on-call/weekend/after  hours pager

## 2021-03-06 NOTE — Evaluation (Signed)
Occupational Therapy Evaluation Patient Details Name: KELENE MOURADIAN MRN: [id number] DOB: July 13, 1931 Today's Date: 03/06/2021    History of Present Illness 87 y.o. female with medical history significant for DM, HTN, dementia, chronic mesenteric ischemia status post SMA stent. Pt was brought to the emergency room by her son after 2 episodes of slurred speech at home lasting 3 to 4 minutes.  Patient returned to baseline after the episodes. CT head and MRI brain negative for acute abnormality. Pt admitted for symptomatic anemia. Possibility of TIA in setting of low Hb.   Clinical Impression   Pt seen for OT evaluation this date. Upon arrival to room, pt asleep in bed, however easily awoken. Pt oriented to self only; PLOF obtained from family d/t hx of dementia. Supportive daughter-in-law present throughout session. Prior to admission, pt was living alone in a 1-story home, with 24/7 assistance from personal care assistants and family. At baseline, pt requires SUPERVISION for functional mobility without AD and for BADLs. Up until 3 months ago, pt was walking around neighborhood (up to 1 mile). Family endorses 1 fall which occured ~5 months ago. Pt currently presents with impulsivity, decreased awareness of deficits, decreased balance, and decreased activity tolerance. Due to these impairments, pt requires MIN GUARD for toilet transfers/hygiene, MIN GUARD for standing grooming tasks, and intermittent MIN A for steadying while walking household distances (~35f) without AD. Pt would benefit from additional skilled OT services to maximize return to PLOF and minimize risk of future falls, injury, caregiver burden, and readmission. Upon discharge, recommend HHOT services and 24/7 assistance.    Follow Up Recommendations  Home health OT;Supervision/Assistance - 24 hour    Equipment Recommendations  Tub/shower bench;Other (comment) (2ww)       Precautions / Restrictions Precautions Precautions:  Fall Restrictions Weight Bearing Restrictions: No      Mobility Bed Mobility Overal bed mobility: Needs Assistance Bed Mobility: Supine to Sit;Sit to Supine     Supine to sit: Supervision Sit to supine: Supervision   General bed mobility comments: able to perform with ease. Supervision required d/t impulsivity    Transfers Overall transfer level: Needs assistance Equipment used: None Transfers: Sit to/from Stand Sit to Stand: Min guard              Balance Overall balance assessment: Needs assistance Sitting-balance support: No upper extremity supported;Feet supported Sitting balance-Leahy Scale: Good Sitting balance - Comments: good sitting balance reaching outside BOS   Standing balance support: No upper extremity supported;During functional activity Standing balance-Leahy Scale: Poor Standing balance comment: intermittent MIN A required for steadying in setting of posterior LOB                           ADL either performed or assessed with clinical judgement   ADL Overall ADL's : Needs assistance/impaired     Grooming: Wash/dry hands;Min guard;Standing Grooming Details (indicate cue type and reason): requires verbal cues for sequencing                 Toilet Transfer: Min guard;Cueing for safety;Regular Toilet;Grab bars;Ambulation   Toileting- Clothing Manipulation and Hygiene: Min guard;Sitting/lateral lean       Functional mobility during ADLs: Minimal assistance (to walk ~80 ft without AD. MIN A for steadying throughout)       Vision Patient Visual Report: No change from baseline              Pertinent Vitals/Pain Pain Assessment: No/denies pain  Hand Dominance     Extremity/Trunk Assessment Upper Extremity Assessment Upper Extremity Assessment: Overall WFL for tasks assessed;Difficult to assess due to impaired cognition   Lower Extremity Assessment Lower Extremity Assessment: Generalized weakness;Difficult to  assess due to impaired cognition   Cervical / Trunk Assessment Cervical / Trunk Assessment: Normal   Communication Communication Communication: No difficulties   Cognition Arousal/Alertness: Awake/alert Behavior During Therapy: WFL for tasks assessed/performed Overall Cognitive Status: History of cognitive impairments - at baseline                                 General Comments: Family reporting that short term memory has been significantly decreasing over past couple months. Pt impulsive throughout session, inconsistently following 1-step commands              Home Living Family/patient expects to be discharged to:: Private residence Living Arrangements: Alone Available Help at Discharge: Personal care attendant;Available 24 hours/day (Pt has 24/7 assistance; PCA assists Sun-Friday and son (who lives in [place]) assists on Saturdays) Type of Home: House Home Access: Level entry     Home Layout: One level     Bathroom Shower/Tub: Tub/shower unit         Home Equipment: None   Additional Comments: Family possibly looking into installing grab bars and obtained shower bench/chair      Prior Functioning/Environment Level of Independence: Needs assistance  Gait / Transfers Assistance Needed: At baseline, pt ambulates without AD w/ SUPERVISION from PCA/family. Up until 3 months ago, pt was walking around neighborhood (up to1 mile). Family endorses 1 fall which occured ~5 months ago. ADL's / Homemaking Assistance Needed: Pt requires SUPERVISION for ADLs (with exception of donning stockings, requires MIN A)            OT Problem List: Decreased strength;Decreased activity tolerance;Impaired balance (sitting and/or standing);Decreased safety awareness;Decreased cognition;Decreased knowledge of use of DME or AE      OT Treatment/Interventions: Self-care/ADL training;Therapeutic exercise;Energy conservation;Therapeutic activities;Patient/family  education;Balance training    OT Goals(Current goals can be found in the care plan section) Acute Rehab OT Goals Patient Stated Goal: to return home safely OT Goal Formulation: With family Time For Goal Achievement: 03/20/21 Potential to Achieve Goals: Good ADL Goals Pt Will Perform Grooming: with supervision;standing Pt Will Transfer to Toilet: with supervision;ambulating;regular height toilet Pt Will Perform Toileting - Clothing Manipulation and hygiene: with modified independence;sitting/lateral leans  OT Frequency: Min 1X/week    AM-PAC OT "6 Clicks" Daily Activity     Outcome Measure Help from another person eating meals?: None Help from another person taking care of personal grooming?: A Little Help from another person toileting, which includes using toliet, bedpan, or urinal?: A Little Help from another person bathing (including washing, rinsing, drying)?: A Little Help from another person to put on and taking off regular upper body clothing?: A Little Help from another person to put on and taking off regular lower body clothing?: A Little 6 Click Score: 19   End of Session Equipment Utilized During Treatment: Gait belt Nurse Communication: Mobility status  Activity Tolerance: Patient tolerated treatment well Patient left: in bed;with call bell/phone within reach;with bed alarm set;with family/visitor present  OT Visit Diagnosis: Unsteadiness on feet (R26.81)                Time: [id number] OT Time Calculation (min): 33 min Charges:  OT General Charges $OT Visit: 1 Visit OT Evaluation $  OT Eval Moderate Complexity: 1 Mod OT Treatments $Self Care/Home Management : 8-22 mins $Therapeutic Activity: 8-22 mins  Fredirick Maudlin, OTR/L [place]

## 2021-03-06 NOTE — Progress Notes (Signed)
Janet Lame, MD [facility]   [address], [place], [place] [address] Phone: [phone] Fax : [phone]   Subjective: I spoke to the patient and the patient's daughter-in-law today.  The patient has had her anticoagulations held with day 5 being on Wednesday.  I was contacted earlier today by the hospitalist who questioned whether the patient could be done as an outpatient instead of waiting in the hospital since she is stable.    Objective: Vital signs in last 24 hours: Vitals:   03/06/21 0423 03/06/21 0752 03/06/21 1245 03/06/21 1541  BP: (!) 142/70 140/69 (!) 146/67 (!) 161/64  Pulse: (!) 59 60 (!) 58 61  Resp: '16 16 18 18  '$ Temp: 98.4 F (36.9 C) 97.7 F (36.5 C) (!) 97.4 F (36.3 C) 98.5 F (36.9 C)  TempSrc: Oral Oral Oral   SpO2: 100% 100% 100% 100%  Weight:      Height:       Weight change:   Intake/Output Summary (Last 24 hours) at 03/06/2021 1847 Last data filed at 03/06/2021 0950 Gross per 24 hour  Intake 921 ml  Output --  Net 921 ml     Exam: General patient alert and confused.   Lab Results: '@LABTEST2'$ @ Micro Results: Recent Results (from the past 240 hour(s))  Resp Panel by RT-PCR (Flu A&B, Covid) Nasopharyngeal Swab     Status: None   Collection Time: 03/04/21  8:43 PM   Specimen: Nasopharyngeal Swab; Nasopharyngeal(NP) swabs in vial transport medium  Result Value Ref Range Status   SARS Coronavirus 2 by RT PCR NEGATIVE NEGATIVE Final    Comment: (NOTE) SARS-CoV-2 target nucleic acids are NOT DETECTED.  The SARS-CoV-2 RNA is generally detectable in upper respiratory specimens during the acute phase of infection. The lowest concentration of SARS-CoV-2 viral copies this assay can detect is 138 copies/mL. A negative result does not preclude SARS-Cov-2 infection and should not be used as the sole basis for treatment or other patient management decisions. A negative result may occur with  improper specimen collection/handling, submission of  specimen other than nasopharyngeal swab, presence of viral mutation(s) within the areas targeted by this assay, and inadequate number of viral copies(<138 copies/mL). A negative result must be combined with clinical observations, patient history, and epidemiological information. The expected result is Negative.  Fact Sheet for Patients:  EntrepreneurPulse.com.au  Fact Sheet for Healthcare Providers:  IncredibleEmployment.be  This test is no t yet approved or cleared by the [place] FDA and  has been authorized for detection and/or diagnosis of SARS-CoV-2 by FDA under an Emergency Use Authorization (EUA). This EUA will remain  in effect (meaning this test can be used) for the duration of the COVID-19 declaration under Section 564(b)(1) of the Act, 21 U.S.C.section 360bbb-3(b)(1), unless the authorization is terminated  or revoked sooner.       Influenza A by PCR NEGATIVE NEGATIVE Final   Influenza B by PCR NEGATIVE NEGATIVE Final    Comment: (NOTE) The Xpert Xpress SARS-CoV-2/FLU/RSV plus assay is intended as an aid in the diagnosis of influenza from Nasopharyngeal swab specimens and should not be used as a sole basis for treatment. Nasal washings and aspirates are unacceptable for Xpert Xpress SARS-CoV-2/FLU/RSV testing.  Fact Sheet for Patients: EntrepreneurPulse.com.au  Fact Sheet for Healthcare Providers: IncredibleEmployment.be  This test is not yet approved or cleared by the [place] FDA and has been authorized for detection and/or diagnosis of SARS-CoV-2 by FDA under an Emergency Use Authorization (EUA). This EUA will  remain in effect (meaning this test can be used) for the duration of the COVID-19 declaration under Section 564(b)(1) of the Act, 21 U.S.C. section 360bbb-3(b)(1), unless the authorization is terminated or revoked.  Performed at [facility], [address], [place], [place] [address]    Studies/Results: [place] Chest 2 View  Result Date: 03/04/2021 CLINICAL DATA:  Dyspnea. Fatigue. [place]. EXAM: CHEST - 2 VIEW COMPARISON:  11/24/2008 FINDINGS: The heart size and mediastinal contours are within normal limits. Aortic atherosclerotic calcification noted. Both lungs are clear. The visualized skeletal structures are unremarkable. IMPRESSION: No active cardiopulmonary disease. Electronically Signed   By: Marlaine Hind M.D.   On: 03/04/2021 21:58   CT Head Wo Contrast  Result Date: 03/04/2021 CLINICAL DATA:  Neuro deficit, acute, stroke suspected Episodes of slurred speech today, now resolved. EXAM: CT HEAD WITHOUT CONTRAST TECHNIQUE: Contiguous axial images were obtained from the base of the skull through the vertex without intravenous contrast. COMPARISON:  Head CT 10/31/2020 FINDINGS: Brain: No intracranial hemorrhage, mass effect, or midline shift. No hydrocephalus. The basilar cisterns are patent. Similar periventricular and deep chronic small vessel ischemia to prior exam. No evidence of territorial infarct or acute ischemia. No extra-axial or intracranial fluid collection. Vascular: Atherosclerosis of skullbase vasculature without hyperdense vessel or abnormal calcification. Skull: No fracture or focal lesion. Sinuses/Orbits: None acute, allowing for motion artifact. Other: None. IMPRESSION: 1. No acute intracranial abnormality. 2. Unchanged chronic small vessel ischemia. Electronically Signed   By: Keith Rake M.D.   On: 03/04/2021 22:02   MR BRAIN WO CONTRAST  Result Date: 03/05/2021 CLINICAL DATA:  Initial evaluation for acute TIA. EXAM: MRI HEAD WITHOUT CONTRAST TECHNIQUE: Multiplanar, multiecho pulse sequences of the brain and surrounding structures were obtained without intravenous contrast. COMPARISON:  CT from 03/04/2021 and CTA from 11/03/2008. Major intracranial vascular flow voids are otherwise maintained. FINDINGS: Brain: Examination  moderately degraded by motion artifact. Generalized age-related cerebral atrophy. Relatively mild chronic microvascular ischemic disease noted involving the periventricular deep white matter both cerebral hemispheres. No abnormal foci of restricted diffusion to suggest acute or subacute ischemia. Gray-white matter differentiation maintained. No encephalomalacia to suggest chronic cortical infarction. No foci of susceptibility artifact to suggest acute or chronic intracranial hemorrhage. No mass lesion, midline shift or mass effect. Mild ventricular prominence related to global parenchymal volume loss without hydrocephalus. No extra-axial fluid collection. Pituitary gland and suprasellar region within normal limits. Midline structures intact. Vascular: Abnormal flow void within the left ICA to the siphon, consistent with chronic left ICA occlusion Skull and upper cervical spine: Craniocervical junction within normal limits. Bone marrow signal intensity normal. No scalp soft tissue abnormality. Sinuses/Orbits: Prior bilateral ocular lens replacement. Paranasal sinuses are largely clear. Small left with trace right mastoid effusions, of doubtful significance. Inner ear structures grossly normal. Visualized nasopharynx unremarkable. Other: None. IMPRESSION: 1. No acute intracranial abnormality. 2. Generalized age-related cerebral atrophy with mild chronic small vessel ischemic disease. 3. Chronic left ICA occlusion. Electronically Signed   By: Jeannine Boga M.D.   On: 03/05/2021 03:56   CT Angio Abd/Pel W and/or Wo Contrast  Result Date: 03/04/2021 CLINICAL DATA:  GI bleed.  Fatigue. EXAM: CTA ABDOMEN AND PELVIS WITHOUT AND WITH CONTRAST TECHNIQUE: Multidetector CT imaging of the abdomen and pelvis was performed using the standard protocol during bolus administration of intravenous contrast. Multiplanar reconstructed images and MIPs were obtained and reviewed to evaluate the vascular anatomy. CONTRAST:  167m  OMNIPAQUE IOHEXOL 350 MG/ML SOLN COMPARISON:  None.  FINDINGS: VASCULAR Aorta: Normal caliber aorta without aneurysm, dissection, vasculitis or significant stenosis. Moderate atherosclerosis. Celiac: Plaque at the origin of the celiac artery causes minimal stenosis. Patent distal branches. No dissection or acute findings. SMA: Stented proximal SMA, stent is patent. Distal branches are patent. No dissection or acute findings. Renals: Plaque at the origin of both renal arteries causing mild stenosis, right greater than left. No dissection or acute findings. IMA: Patent. Inflow: Patent without evidence of aneurysm, dissection, vasculitis or significant stenosis. Moderate atheromatous plaque. Proximal Outflow: Bilateral common femoral and visualized portions of the superficial and profunda femoral arteries are patent without evidence of aneurysm, dissection, vasculitis or significant stenosis. Veins: Venous phase imaging demonstrates patent iliac system and IVC. Patent portal, splenic, and mesenteric veins. No acute venous findings. Review of the MIP images confirms the above findings. NON-VASCULAR Lower chest: Cardiomegaly. Linear atelectasis in both lower lobes. Decreased density of the blood pool suggesting anemia. Hepatobiliary: No focal liver abnormality is seen. No gallstones, gallbladder wall thickening, or biliary dilatation. Pancreas: No ductal dilatation or inflammation. Spleen: Normal in size without focal abnormality. Adrenals/Urinary Tract: Normal adrenal glands. No hydronephrosis. 8 mm nonobstructing stone in the upper right kidney. 6.3 cm central left renal cyst. Decompressed ureters. Minimally distended urinary bladder. Stomach/Bowel: Fluid distended stomach. Small hiatal hernia with fluid in the distal esophagus and mild distal esophageal wall thickening. There is no extravasation of contrast in the GI tract to localize site of GI bleed. Unremarkable small bowel. Moderate stool burden throughout the  colon. Sigmoid colon is redundant. Minimal sigmoid diverticulosis. No diverticulitis. No colonic inflammation. Normal appendix. Lymphatic: No enlarged lymph nodes in the abdomen or pelvis. Reproductive: Enlarged lobulated uterus with multiple calcified fibroids. Ovaries are not discretely seen. Other: No free air or free fluid. Tiny fat containing umbilical hernia. Musculoskeletal: Degenerative change in the spine and both hips. There are no acute or suspicious osseous abnormalities. IMPRESSION: 1. No accumulation of contrast in the GI tract to localize site of GI bleed. 2. Moderate aortic atherosclerosis. Moderate branch atherosclerosis. SMA stent which is patent. 3. Moderate diffuse colonic stool burden with colonic redundancy. Minimal sigmoid diverticulosis without diverticulitis. 4. Small hiatal hernia with fluid in the distal esophagus and mild distal esophageal wall thickening, can be seen with reflux or esophagitis. There is ingested material distending the stomach. 5. Right nonobstructing renal stone. 6. Enlarged lobulated uterus with multiple calcified fibroids. Aortic Atherosclerosis (ICD10-I70.0). Electronically Signed   By: Keith Rake M.D.   On: 03/04/2021 22:48   Medications: I have reviewed the patient's current medications. Scheduled Meds:  sodium chloride   Intravenous Once   atorvastatin  20 mg Oral Daily   donepezil  5 mg Oral QHS   feeding supplement (GLUCERNA SHAKE)  237 mL Oral TID BM   gabapentin  300 mg Oral QHS   insulin aspart  0-9 Units Subcutaneous Q4H   levothyroxine  100 mcg Oral Q0600   losartan  25 mg Oral Daily   memantine  10 mg Oral BID   [START ON 03/07/2021] multivitamin with minerals  1 tablet Oral Daily   pantoprazole (PROTONIX) IV  40 mg Intravenous Q12H   Continuous Infusions:  sodium chloride Stopped (03/04/21 2323)   PRN Meds:.acetaminophen **OR** acetaminophen, morphine injection, ondansetron **OR** ondansetron (ZOFRAN) IV   Assessment: Principal  Problem:   Symptomatic anemia Active Problems:   Controlled type 2 diabetes mellitus without complication (HCC)   Adult hypothyroidism   Essential hypertension   Late onset Alzheimer's disease  without behavioral disturbance (HCC)   Chronic mesenteric ischemia ([place])   Slurred speech    Plan: This patient has been stable after having profound anemia.  The patient is cleared to go home from a GI point of view with a follow-up of an EGD and colonoscopy this Thursday as an outpatient.  The daughter-in-law will be contacted by my office to set up the arrangements for her procedures.  Nothing further to do from a GI point of view as an inpatient.  I will sign off.  Please call if any further GI concerns or questions.  We would like to thank you for the opportunity to participate in the care of Janet Gross.    LOS: 2 days   Lewayne Bunting 03/06/2021, 6:47 PM Pager [phone] 7am-5pm  Check AMION for 5pm -7am coverage and on weekends

## 2021-03-07 ENCOUNTER — Other Ambulatory Visit: Payer: Self-pay

## 2021-03-07 DIAGNOSIS — E440 Moderate protein-calorie malnutrition: Secondary | ICD-10-CM | POA: Insufficient documentation

## 2021-03-07 DIAGNOSIS — R4781 Slurred speech: Secondary | ICD-10-CM | POA: Diagnosis not present

## 2021-03-07 DIAGNOSIS — D649 Anemia, unspecified: Secondary | ICD-10-CM | POA: Diagnosis not present

## 2021-03-07 DIAGNOSIS — D62 Acute posthemorrhagic anemia: Secondary | ICD-10-CM | POA: Diagnosis not present

## 2021-03-07 LAB — BPAM RBC
Blood Product Expiration Date: 202209032359
Blood Product Expiration Date: 202209032359
Blood Product Expiration Date: 202209032359
Blood Product Expiration Date: 202209032359
Blood Product Expiration Date: 202209032359
ISSUE DATE / TIME: 202207302237
ISSUE DATE / TIME: 202207310307
ISSUE DATE / TIME: 202207311609
ISSUE DATE / TIME: 202207312116
Unit Type and Rh: 5100
Unit Type and Rh: 5100
Unit Type and Rh: 5100
Unit Type and Rh: 5100
Unit Type and Rh: 5100

## 2021-03-07 LAB — TYPE AND SCREEN
ABO/RH(D): O POS
Antibody Screen: NEGATIVE
Unit division: 0
Unit division: 0
Unit division: 0
Unit division: 0
Unit division: 0

## 2021-03-07 LAB — CBC
HCT: 32 % — ABNORMAL LOW (ref 36.0–46.0)
Hemoglobin: 10.3 g/dL — ABNORMAL LOW (ref 12.0–15.0)
MCH: 24.8 pg — ABNORMAL LOW (ref 26.0–34.0)
MCHC: 32.2 g/dL (ref 30.0–36.0)
MCV: 77.1 fL — ABNORMAL LOW (ref 80.0–100.0)
Platelets: 333 10*3/uL (ref 150–400)
RBC: 4.15 MIL/uL (ref 3.87–5.11)
RDW: 23.3 % — ABNORMAL HIGH (ref 11.5–15.5)
WBC: 5.3 10*3/uL (ref 4.0–10.5)
nRBC: 0 % (ref 0.0–0.2)

## 2021-03-07 LAB — GLUCOSE, CAPILLARY
Glucose-Capillary: 102 mg/dL — ABNORMAL HIGH (ref 70–99)
Glucose-Capillary: 109 mg/dL — ABNORMAL HIGH (ref 70–99)
Glucose-Capillary: 110 mg/dL — ABNORMAL HIGH (ref 70–99)

## 2021-03-07 LAB — PREPARE RBC (CROSSMATCH)

## 2021-03-07 MED ORDER — CLOPIDOGREL BISULFATE 75 MG PO TABS: 75.0000 mg | ORAL_TABLET | Freq: Every day | ORAL | 1 refills | Status: DC

## 2021-03-07 MED ORDER — FERROUS SULFATE 325 (65 FE) MG PO TBEC: 325.0000 mg | DELAYED_RELEASE_TABLET | Freq: Two times a day (BID) | ORAL | 3 refills | Status: AC

## 2021-03-07 MED ORDER — ADULT MULTIVITAMIN W/MINERALS CH: 1.0000 | ORAL_TABLET | Freq: Every day | ORAL | 0 refills | Status: AC

## 2021-03-07 MED ORDER — GLUCERNA SHAKE PO LIQD: 237.0000 mL | Freq: Three times a day (TID) | ORAL | 12 refills | Status: AC

## 2021-03-07 MED ORDER — ESOMEPRAZOLE MAGNESIUM 20 MG PO CPDR: 20.0000 mg | DELAYED_RELEASE_CAPSULE | Freq: Two times a day (BID) | ORAL | 1 refills | Status: AC

## 2021-03-07 MED ORDER — ASPIRIN EC 81 MG PO TBEC: 81.0000 mg | DELAYED_RELEASE_TABLET | Freq: Every day | ORAL | 2 refills | Status: AC

## 2021-03-07 NOTE — TOC Progression Note (Signed)
Transition of Care [facility]) - Progression Note    Patient Details  Name: Janet Gross MRN: [id number] Date of Birth: 1931-02-15  Transition of Care [facility]) CM/SW [place], RN Phone Number: 03/07/2021, 10:53 AM  Clinical Narrative:   Patient's son and daughter in law at bedside, patient was sleeping at encounter.  Son, Rolan Bucco states patient has personal care aide during the week and he and wife assist on weekends.  Patient is typically mobile, but needs supervision.  PT OT recommendation is for supervision and shower bench, which son reports patient has in her home currently.  Personal care aide transports patient to appointments and obtains and administers medications as directed.  Son and daughter in law decline TOC assistance at this time, state that they are comfortable with patient returning home on discharge.  TOC contact information given to both.         Expected Discharge Plan and Services           Expected Discharge Date: 03/07/21                                     Social Determinants of Health (SDOH) Interventions    Readmission Risk Interventions No flowsheet data found.

## 2021-03-07 NOTE — Care Management Important Message (Signed)
Important Message  Patient Details  Name: Janet Gross MRN: [id number] Date of Birth: 1931-03-24   Medicare Important Message Given:  N/A - LOS <3 / Initial given by admissions     Juliann Pulse A Genieve Ramaswamy 03/07/2021, 9:12 AM

## 2021-03-07 NOTE — Discharge Summary (Signed)
Physician Discharge Summary  QUANNA WITTKE HGD:[id number] DOB: [birth year] DOA: 03/04/2021  PCP: Baxter Hire, MD  Admit date: 03/04/2021 Discharge date: 03/07/2021  Admitted From: Home Disposition: Home  Recommendations for Outpatient Follow-up:  Follow up with PCP in 1-2 weeks Follow-up with gastroenterology-for procedure on Thursday. Please obtain BMP/CBC in one week Please follow up on the following pending results: None  Home Health: Yes Equipment/Devices: Rolling walker Discharge Condition: Stable CODE STATUS: Full Diet recommendation: Heart Healthy / Carb Modified   Brief/Interim Summary: Janet Gross is a 87 y.o. female with medical history significant for DM, HTN, dementia, chronic mesenteric ischemia status post SMA stent on DAPT, with history of iron deficiency anemia and upper and lower endoscopy in 2018 showing mild reactive gastropathy on EGD and tubular adenomas and hyperplastic polyps on colonoscopy who was brought to the emergency room by her son after 2 episodes of slurred speech at home lasting 3 to 4 minutes.  Patient returned to baseline after the episodes.  Most of the history is given by her son at the bedside, according to him her mother was pretty active approximately 3 months ago.  Since May she developed worsening fatigue and exertional dyspnea.  She had PCI to SMA done in April 2022 for critical mesenteric ischemia and was placed on DAPT.  Patient has underlying dementia and cannot tell me if she has experiencing black/tarry color stools or not.  Son has not seen any obvious bleeding.  This saw the PCP last week with similar complaints and apparently have some blood work-up, son was not sure about it but he was told by the doctor that blood work-up looks okay, on care everywhere she just had BMP and A1c which was within normal limit.  Cannot see any CBC done.  Prior CBC in March with hemoglobin of 13.  She was found to have hemoglobin of 3.9 when came to ED.   FOBT positive. Received total of 4 units of PRBC, hemoglobin was 10.3 on discharge. GI was consulted-they will proceed with scopes after washing out of aspirin and Plavix. Aspirin and Plavix is on hold-discussed with vascular and according to them that can be held until she stabilizes from possible GI bleed.  EGD was scheduled for Thursday morning.  Patient and family would like to go home and proceed with EGD as an outpatient.  GI agrees that patient is being discharged home with a close GI follow-up and outpatient procedure scheduled for Thursday morning. No obvious bleeding.  Patient underwent TIA work-up for complaint of transient slurred speech.  CT head and MRI was negative and without any acute abnormality.  Our PT is recommending home health services, per family she already had caregivers and services at home.  Patient has an history of chronic mesenteric ischemia s/p SMA stenting placed 1 month ago.  She will resume her home aspirin and Plavix after clearance from GI.  Patient will continue with rest of her home medications and follow-up with her providers.  Discharge Diagnoses:  Principal Problem:   Symptomatic anemia Active Problems:   Controlled type 2 diabetes mellitus without complication (HCC)   Adult hypothyroidism   Essential hypertension   Late onset Alzheimer's disease without behavioral disturbance (HCC)   Chronic mesenteric ischemia (HCC)   Slurred speech   Malnutrition of moderate degree   Discharge Instructions  Discharge Instructions     Diet - low sodium heart healthy   Complete by: As directed    Discharge instructions   Complete  by: As directed    It was pleasure taking care of you. Please keep holding your aspirin and Plavix until cleared by your gastroenterologist. You are scheduled for your procedure on Thursday as an outpatient, please follow the directions.   Increase activity slowly   Complete by: As directed       Allergies as of 03/07/2021        Reactions   Sulfa Antibiotics Other (See Comments)   INCREASES HER BLOOD SUGAR , PT STATED         Medication List     TAKE these medications    aspirin EC 81 MG tablet Take 1 tablet (81 mg total) by mouth daily. Hold until cleared by gastroenterology What changed: additional instructions   atorvastatin 20 MG tablet Commonly known as: LIPITOR Take 20 mg by mouth daily.   clopidogrel 75 MG tablet Commonly known as: PLAVIX Take 1 tablet (75 mg total) by mouth daily. Please hold until cleared by gastroenterology What changed: additional instructions   donepezil 5 MG tablet Commonly known as: ARICEPT Take 5 mg by mouth at bedtime.   esomeprazole 20 MG capsule Commonly known as: NexIUM Take 1 capsule (20 mg total) by mouth 2 (two) times daily before a meal.   feeding supplement (GLUCERNA SHAKE) Liqd Take 237 mLs by mouth 3 (three) times daily between meals.   ferrous sulfate 325 (65 FE) MG EC tablet Take 1 tablet (325 mg total) by mouth 2 (two) times daily.   gabapentin 300 MG capsule Commonly known as: NEURONTIN Take 300 mg by mouth at bedtime.   glimepiride 1 MG tablet Commonly known as: AMARYL Take 1 mg by mouth daily with breakfast.   levothyroxine 100 MCG tablet Commonly known as: SYNTHROID 100 mcg daily before breakfast.   losartan 25 MG tablet Commonly known as: COZAAR Take 25 mg by mouth daily.   memantine 10 MG tablet Commonly known as: NAMENDA Take 10 mg by mouth 2 (two) times daily.   metFORMIN 1000 MG tablet Commonly known as: GLUCOPHAGE Take 500 mg by mouth 2 (two) times daily with a meal.   multivitamin with minerals Tabs tablet Take 1 tablet by mouth daily. Start taking on: March 08, 2021   Na Sulfate-K Sulfate-Mg Sulf 17.5-3.13-1.6 GM/177ML Soln Commonly known as: Suprep Bowel Prep Kit Take 1 kit by mouth as directed.   vitamin B-12 1000 MCG tablet Commonly known as: CYANOCOBALAMIN Take 1,000 mcg by mouth daily.         Follow-up Information     Baxter Hire, MD. Schedule an appointment as soon as possible for a visit in 1 week(s).   Specialty: Internal Medicine Contact information: [place] [address] [phone]         Virgel Manifold, MD Follow up.   Specialty: Gastroenterology Contact information: [place] [address] [phone]                Allergies  Allergen Reactions   Sulfa Antibiotics Other (See Comments)    INCREASES HER BLOOD SUGAR , PT STATED     Consultations: Gastroenterology  Procedures/Studies: DG Chest 2 View  Result Date: 03/04/2021 CLINICAL DATA:  Dyspnea. Fatigue. [place]. EXAM: CHEST - 2 VIEW COMPARISON:  11/24/2008 FINDINGS: The heart size and mediastinal contours are within normal limits. Aortic atherosclerotic calcification noted. Both lungs are clear. The visualized skeletal structures are unremarkable. IMPRESSION: No active cardiopulmonary disease. Electronically Signed   By: Marlaine Hind  M.D.   On: 03/04/2021 21:58   CT Head Wo Contrast  Result Date: 03/04/2021 CLINICAL DATA:  Neuro deficit, acute, stroke suspected Episodes of slurred speech today, now resolved. EXAM: CT HEAD WITHOUT CONTRAST TECHNIQUE: Contiguous axial images were obtained from the base of the skull through the vertex without intravenous contrast. COMPARISON:  Head CT 10/31/2020 FINDINGS: Brain: No intracranial hemorrhage, mass effect, or midline shift. No hydrocephalus. The basilar cisterns are patent. Similar periventricular and deep chronic small vessel ischemia to prior exam. No evidence of territorial infarct or acute ischemia. No extra-axial or intracranial fluid collection. Vascular: Atherosclerosis of skullbase vasculature without hyperdense vessel or abnormal calcification. Skull: No fracture or focal lesion. Sinuses/Orbits: None acute, allowing for motion artifact. Other: None. IMPRESSION: 1. No acute intracranial  abnormality. 2. Unchanged chronic small vessel ischemia. Electronically Signed   By: Keith Rake M.D.   On: 03/04/2021 22:02   MR BRAIN WO CONTRAST  Result Date: 03/05/2021 CLINICAL DATA:  Initial evaluation for acute TIA. EXAM: MRI HEAD WITHOUT CONTRAST TECHNIQUE: Multiplanar, multiecho pulse sequences of the brain and surrounding structures were obtained without intravenous contrast. COMPARISON:  CT from 03/04/2021 and CTA from 11/03/2008. Major intracranial vascular flow voids are otherwise maintained. FINDINGS: Brain: Examination moderately degraded by motion artifact. Generalized age-related cerebral atrophy. Relatively mild chronic microvascular ischemic disease noted involving the periventricular deep white matter both cerebral hemispheres. No abnormal foci of restricted diffusion to suggest acute or subacute ischemia. Gray-white matter differentiation maintained. No encephalomalacia to suggest chronic cortical infarction. No foci of susceptibility artifact to suggest acute or chronic intracranial hemorrhage. No mass lesion, midline shift or mass effect. Mild ventricular prominence related to global parenchymal volume loss without hydrocephalus. No extra-axial fluid collection. Pituitary gland and suprasellar region within normal limits. Midline structures intact. Vascular: Abnormal flow void within the left ICA to the siphon, consistent with chronic left ICA occlusion Skull and upper cervical spine: Craniocervical junction within normal limits. Bone marrow signal intensity normal. No scalp soft tissue abnormality. Sinuses/Orbits: Prior bilateral ocular lens replacement. Paranasal sinuses are largely clear. Small left with trace right mastoid effusions, of doubtful significance. Inner ear structures grossly normal. Visualized nasopharynx unremarkable. Other: None. IMPRESSION: 1. No acute intracranial abnormality. 2. Generalized age-related cerebral atrophy with mild chronic small vessel ischemic  disease. 3. Chronic left ICA occlusion. Electronically Signed   By: Jeannine Boga M.D.   On: 03/05/2021 03:56   CT Angio Abd/Pel W and/or Wo Contrast  Result Date: 03/04/2021 CLINICAL DATA:  GI bleed.  Fatigue. EXAM: CTA ABDOMEN AND PELVIS WITHOUT AND WITH CONTRAST TECHNIQUE: Multidetector CT imaging of the abdomen and pelvis was performed using the standard protocol during bolus administration of intravenous contrast. Multiplanar reconstructed images and MIPs were obtained and reviewed to evaluate the vascular anatomy. CONTRAST:  1105m OMNIPAQUE IOHEXOL 350 MG/ML SOLN COMPARISON:  None. FINDINGS: VASCULAR Aorta: Normal caliber aorta without aneurysm, dissection, vasculitis or significant stenosis. Moderate atherosclerosis. Celiac: Plaque at the origin of the celiac artery causes minimal stenosis. Patent distal branches. No dissection or acute findings. SMA: Stented proximal SMA, stent is patent. Distal branches are patent. No dissection or acute findings. Renals: Plaque at the origin of both renal arteries causing mild stenosis, right greater than left. No dissection or acute findings. IMA: Patent. Inflow: Patent without evidence of aneurysm, dissection, vasculitis or significant stenosis. Moderate atheromatous plaque. Proximal Outflow: Bilateral common femoral and visualized portions of the superficial and profunda femoral arteries are patent without evidence of aneurysm, dissection, vasculitis or  significant stenosis. Veins: Venous phase imaging demonstrates patent iliac system and IVC. Patent portal, splenic, and mesenteric veins. No acute venous findings. Review of the MIP images confirms the above findings. NON-VASCULAR Lower chest: Cardiomegaly. Linear atelectasis in both lower lobes. Decreased density of the blood pool suggesting anemia. Hepatobiliary: No focal liver abnormality is seen. No gallstones, gallbladder wall thickening, or biliary dilatation. Pancreas: No ductal dilatation or  inflammation. Spleen: Normal in size without focal abnormality. Adrenals/Urinary Tract: Normal adrenal glands. No hydronephrosis. 8 mm nonobstructing stone in the upper right kidney. 6.3 cm central left renal cyst. Decompressed ureters. Minimally distended urinary bladder. Stomach/Bowel: Fluid distended stomach. Small hiatal hernia with fluid in the distal esophagus and mild distal esophageal wall thickening. There is no extravasation of contrast in the GI tract to localize site of GI bleed. Unremarkable small bowel. Moderate stool burden throughout the colon. Sigmoid colon is redundant. Minimal sigmoid diverticulosis. No diverticulitis. No colonic inflammation. Normal appendix. Lymphatic: No enlarged lymph nodes in the abdomen or pelvis. Reproductive: Enlarged lobulated uterus with multiple calcified fibroids. Ovaries are not discretely seen. Other: No free air or free fluid. Tiny fat containing umbilical hernia. Musculoskeletal: Degenerative change in the spine and both hips. There are no acute or suspicious osseous abnormalities. IMPRESSION: 1. No accumulation of contrast in the GI tract to localize site of GI bleed. 2. Moderate aortic atherosclerosis. Moderate branch atherosclerosis. SMA stent which is patent. 3. Moderate diffuse colonic stool burden with colonic redundancy. Minimal sigmoid diverticulosis without diverticulitis. 4. Small hiatal hernia with fluid in the distal esophagus and mild distal esophageal wall thickening, can be seen with reflux or esophagitis. There is ingested material distending the stomach. 5. Right nonobstructing renal stone. 6. Enlarged lobulated uterus with multiple calcified fibroids. Aortic Atherosclerosis (ICD10-I70.0). Electronically Signed   By: Keith Rake M.D.   On: 03/04/2021 22:48    Subjective: Patient was seen and examined today.  No new complaints.  No bleeding.  Son and daughter-in-law at bedside.  She was eating her breakfast and wants to go home.  Discharge  Exam: Vitals:   03/07/21 0434 03/07/21 0830  BP: (!) 166/73 (!) 163/79  Pulse: (!) 54 (!) 58  Resp: 16 16  Temp: (!) 97.5 F (36.4 C) 97.7 F (36.5 C)  SpO2: 100% 99%   Vitals:   03/06/21 2029 03/07/21 0015 03/07/21 0434 03/07/21 0830  BP: (!) 142/60 (!) 145/71 (!) 166/73 (!) 163/79  Pulse: (!) 59 (!) 57 (!) 54 (!) 58  Resp: _0 Temp: 98.1 F (36.7 C) (!) 97.5 F (36.4 C) (!) 97.5 F (36.4 C) 97.7 F (36.5 C)  TempSrc: Oral Oral Oral Oral  SpO2: 100% 98% 100% 99%  Weight:      Height:        General: Pt is alert, awake, not in acute distress Cardiovascular: RRR, S1/S2 +, no rubs, no gallops Respiratory: CTA bilaterally, no wheezing, no rhonchi Abdominal: Soft, NT, ND, bowel sounds + Extremities: no edema, no cyanosis   The results of significant diagnostics from this hospitalization (including imaging, microbiology, ancillary and laboratory) are listed below for reference.    Microbiology: Recent Results (from the past 240 hour(s))  Resp Panel by RT-PCR (Flu A&B, Covid) Nasopharyngeal Swab     Status: None   Collection Time: 03/04/21  8:43 PM   Specimen: Nasopharyngeal Swab; Nasopharyngeal(NP) swabs in vial transport medium  Result Value Ref Range Status   SARS Coronavirus 2 by RT PCR NEGATIVE NEGATIVE Final  Comment: (NOTE) SARS-CoV-2 target nucleic acids are NOT DETECTED.  The SARS-CoV-2 RNA is generally detectable in upper respiratory specimens during the acute phase of infection. The lowest concentration of SARS-CoV-2 viral copies this assay can detect is 138 copies/mL. A negative result does not preclude SARS-Cov-2 infection and should not be used as the sole basis for treatment or other patient management decisions. A negative result may occur with  improper specimen collection/handling, submission of specimen other than nasopharyngeal swab, presence of viral mutation(s) within the areas targeted by this assay, and inadequate number of  viral copies(<138 copies/mL). A negative result must be combined with clinical observations, patient history, and epidemiological information. The expected result is Negative.  Fact Sheet for Patients:  EntrepreneurPulse.com.au  Fact Sheet for Healthcare Providers:  IncredibleEmployment.be  This test is no t yet approved or cleared by the [place] FDA and  has been authorized for detection and/or diagnosis of SARS-CoV-2 by FDA under an Emergency Use Authorization (EUA). This EUA will remain  in effect (meaning this test can be used) for the duration of the COVID-19 declaration under Section 564(b)(1) of the Act, 21 U.S.C.section 360bbb-3(b)(1), unless the authorization is terminated  or revoked sooner.       Influenza A by PCR NEGATIVE NEGATIVE Final   Influenza B by PCR NEGATIVE NEGATIVE Final    Comment: (NOTE) The Xpert Xpress SARS-CoV-2/FLU/RSV plus assay is intended as an aid in the diagnosis of influenza from Nasopharyngeal swab specimens and should not be used as a sole basis for treatment. Nasal washings and aspirates are unacceptable for Xpert Xpress SARS-CoV-2/FLU/RSV testing.  Fact Sheet for Patients: EntrepreneurPulse.com.au  Fact Sheet for Healthcare Providers: IncredibleEmployment.be  This test is not yet approved or cleared by the [place] FDA and has been authorized for detection and/or diagnosis of SARS-CoV-2 by FDA under an Emergency Use Authorization (EUA). This EUA will remain in effect (meaning this test can be used) for the duration of the COVID-19 declaration under Section 564(b)(1) of the Act, 21 U.S.C. section 360bbb-3(b)(1), unless the authorization is terminated or revoked.  Performed at [facility], [address] [place], [place] [address]      Labs: BNP (last 3 results) Recent Labs    03/04/21 2023  BNP 88.4   Basic Metabolic Panel: Recent  Labs  Lab 03/04/21 2023 03/06/21 0631  NA 139 139  K 3.6 3.4*  CL 110 108  CO2 22 24  GLUCOSE 154* 92  BUN 12 7*  CREATININE 0.83 0.77  CALCIUM 8.8* 8.6*  MG 2.0  --    Liver Function Tests: Recent Labs  Lab 03/04/21 2023  AST 17  ALT 14  ALKPHOS 56  BILITOT 0.4  PROT 6.5  ALBUMIN 3.7   No results for input(s): LIPASE, AMYLASE in the last 168 hours. No results for input(s): AMMONIA in the last 168 hours. CBC: Recent Labs  Lab 03/04/21 2023 03/05/21 1048 03/05/21 1951 03/06/21 0631 03/06/21 1851 03/07/21 0450  WBC 4.7 7.5 6.2 4.9 6.3 5.3  NEUTROABS 2.9  --   --   --   --   --   HGB 3.9* 7.5* 9.1* 10.2* 9.6* 10.3*  HCT 14.0* 24.5* 28.2* 31.5* 29.7* 32.0*  MCV 65.7* 74.2* 76.4* 76.6* 79.0* 77.1*  PLT 368 290 357 326 297 333   Cardiac Enzymes: No results for input(s): CKTOTAL, CKMB, CKMBINDEX, TROPONINI in the last 168 hours. BNP: Invalid input(s): POCBNP CBG: Recent Labs  Lab 03/06/21 1542 03/06/21 2032 03/07/21 0016  03/07/21 0434 03/07/21 0834  GLUCAP 136* 173* 110* 109* 102*   D-Dimer No results for input(s): DDIMER in the last 72 hours. Hgb A1c Recent Labs    03/05/21 1048  HGBA1C 5.7*   Lipid Profile No results for input(s): CHOL, HDL, LDLCALC, TRIG, CHOLHDL, LDLDIRECT in the last 72 hours. Thyroid function studies No results for input(s): TSH, T4TOTAL, T3FREE, THYROIDAB in the last 72 hours.  Invalid input(s): FREET3 Anemia work up Recent Labs    03/04/21 2023 03/05/21 1048  TIBC 456*  --   IRON 107  --   RETICCTPCT  --  2.1   Urinalysis    Component Value Date/Time   COLORURINE YELLOW (A) 10/31/2020 1810   APPEARANCEUR CLEAR (A) 10/31/2020 1810   APPEARANCEUR Clear 08/19/2013 2014   LABSPEC 1.011 10/31/2020 1810   LABSPEC 1.020 08/19/2013 2014   PHURINE 5.0 10/31/2020 1810   GLUCOSEU NEGATIVE 10/31/2020 1810   GLUCOSEU 150 mg/dL 08/19/2013 2014   HGBUR NEGATIVE 10/31/2020 1810   BILIRUBINUR NEGATIVE 10/31/2020 1810    BILIRUBINUR Negative 08/19/2013 2014   KETONESUR NEGATIVE 10/31/2020 1810   PROTEINUR NEGATIVE 10/31/2020 1810   NITRITE NEGATIVE 10/31/2020 1810   LEUKOCYTESUR SMALL (A) 10/31/2020 1810   LEUKOCYTESUR 1+ 08/19/2013 2014   Sepsis Labs Invalid input(s): PROCALCITONIN,  WBC,  LACTICIDVEN Microbiology Recent Results (from the past 240 hour(s))  Resp Panel by RT-PCR (Flu A&B, Covid) Nasopharyngeal Swab     Status: None   Collection Time: 03/04/21  8:43 PM   Specimen: Nasopharyngeal Swab; Nasopharyngeal(NP) swabs in vial transport medium  Result Value Ref Range Status   SARS Coronavirus 2 by RT PCR NEGATIVE NEGATIVE Final    Comment: (NOTE) SARS-CoV-2 target nucleic acids are NOT DETECTED.  The SARS-CoV-2 RNA is generally detectable in upper respiratory specimens during the acute phase of infection. The lowest concentration of SARS-CoV-2 viral copies this assay can detect is 138 copies/mL. A negative result does not preclude SARS-Cov-2 infection and should not be used as the sole basis for treatment or other patient management decisions. A negative result may occur with  improper specimen collection/handling, submission of specimen other than nasopharyngeal swab, presence of viral mutation(s) within the areas targeted by this assay, and inadequate number of viral copies(<138 copies/mL). A negative result must be combined with clinical observations, patient history, and epidemiological information. The expected result is Negative.  Fact Sheet for Patients:  EntrepreneurPulse.com.au  Fact Sheet for Healthcare Providers:  IncredibleEmployment.be  This test is no t yet approved or cleared by the [place] FDA and  has been authorized for detection and/or diagnosis of SARS-CoV-2 by FDA under an Emergency Use Authorization (EUA). This EUA will remain  in effect (meaning this test can be used) for the duration of the COVID-19 declaration under  Section 564(b)(1) of the Act, 21 U.S.C.section 360bbb-3(b)(1), unless the authorization is terminated  or revoked sooner.       Influenza A by PCR NEGATIVE NEGATIVE Final   Influenza B by PCR NEGATIVE NEGATIVE Final    Comment: (NOTE) The Xpert Xpress SARS-CoV-2/FLU/RSV plus assay is intended as an aid in the diagnosis of influenza from Nasopharyngeal swab specimens and should not be used as a sole basis for treatment. Nasal washings and aspirates are unacceptable for Xpert Xpress SARS-CoV-2/FLU/RSV testing.  Fact Sheet for Patients: EntrepreneurPulse.com.au  Fact Sheet for Healthcare Providers: IncredibleEmployment.be  This test is not yet approved or cleared by the [place] FDA and has been authorized for detection and/or diagnosis of  SARS-CoV-2 by FDA under an Emergency Use Authorization (EUA). This EUA will remain in effect (meaning this test can be used) for the duration of the COVID-19 declaration under Section 564(b)(1) of the Act, 21 U.S.C. section 360bbb-3(b)(1), unless the authorization is terminated or revoked.  Performed at [facility], [address] [place], [place] [address]     Time coordinating discharge: Over 30 minutes  SIGNED:  Lorella Nimrod, MD  [facility] 03/07/2021, 10:12 AM  If 7PM-7AM, please contact night-coverage [email or web address]  This record has been created using Systems analyst. Errors have been sought and corrected,but may not always be located. Such creation errors do not reflect on the standard of care.

## 2021-03-08 ENCOUNTER — Encounter (INDEPENDENT_AMBULATORY_CARE_PROVIDER_SITE_OTHER): Payer: Medicare PPO

## 2021-03-08 ENCOUNTER — Ambulatory Visit (INDEPENDENT_AMBULATORY_CARE_PROVIDER_SITE_OTHER): Payer: Medicare PPO | Admitting: Nurse Practitioner

## 2021-03-09 ENCOUNTER — Ambulatory Visit: Payer: Medicare PPO | Admitting: Anesthesiology

## 2021-03-09 ENCOUNTER — Encounter: Payer: Self-pay | Admitting: Gastroenterology

## 2021-03-09 ENCOUNTER — Other Ambulatory Visit: Payer: Self-pay

## 2021-03-09 ENCOUNTER — Encounter
Admission: RE | Disposition: A | Discharge status: Home / Self Care | Payer: Self-pay | Source: Home / Self Care | Attending: Gastroenterology

## 2021-03-09 ENCOUNTER — Telehealth (INDEPENDENT_AMBULATORY_CARE_PROVIDER_SITE_OTHER): Payer: Self-pay | Admitting: Vascular Surgery

## 2021-03-09 ENCOUNTER — Other Ambulatory Visit (HOSPITAL_COMMUNITY): Payer: Self-pay | Admitting: Nurse Practitioner

## 2021-03-09 ENCOUNTER — Telehealth: Payer: Self-pay | Admitting: Oncology

## 2021-03-09 ENCOUNTER — Ambulatory Visit
Admission: RE | Admit: 2021-03-09 | Discharge: 2021-03-09 | Disposition: A | Discharge status: Home / Self Care | Payer: Medicare PPO | Attending: Gastroenterology | Admitting: Gastroenterology

## 2021-03-09 DIAGNOSIS — K5730 Diverticulosis of large intestine without perforation or abscess without bleeding: Secondary | ICD-10-CM | POA: Diagnosis not present

## 2021-03-09 DIAGNOSIS — K319 Disease of stomach and duodenum, unspecified: Secondary | ICD-10-CM

## 2021-03-09 DIAGNOSIS — R1319 Other dysphagia: Secondary | ICD-10-CM

## 2021-03-09 DIAGNOSIS — Z1211 Encounter for screening for malignant neoplasm of colon: Secondary | ICD-10-CM | POA: Diagnosis not present

## 2021-03-09 DIAGNOSIS — K222 Esophageal obstruction: Secondary | ICD-10-CM | POA: Diagnosis not present

## 2021-03-09 DIAGNOSIS — R1310 Dysphagia, unspecified: Secondary | ICD-10-CM | POA: Diagnosis not present

## 2021-03-09 DIAGNOSIS — Z79890 Hormone replacement therapy (postmenopausal): Secondary | ICD-10-CM | POA: Diagnosis not present

## 2021-03-09 DIAGNOSIS — Z7984 Long term (current) use of oral hypoglycemic drugs: Secondary | ICD-10-CM | POA: Insufficient documentation

## 2021-03-09 DIAGNOSIS — D509 Iron deficiency anemia, unspecified: Secondary | ICD-10-CM | POA: Diagnosis not present

## 2021-03-09 DIAGNOSIS — Z7982 Long term (current) use of aspirin: Secondary | ICD-10-CM | POA: Diagnosis not present

## 2021-03-09 DIAGNOSIS — K648 Other hemorrhoids: Secondary | ICD-10-CM | POA: Diagnosis not present

## 2021-03-09 DIAGNOSIS — Z79899 Other long term (current) drug therapy: Secondary | ICD-10-CM | POA: Insufficient documentation

## 2021-03-09 DIAGNOSIS — K635 Polyp of colon: Secondary | ICD-10-CM | POA: Diagnosis not present

## 2021-03-09 DIAGNOSIS — Z87891 Personal history of nicotine dependence: Secondary | ICD-10-CM | POA: Diagnosis not present

## 2021-03-09 DIAGNOSIS — K449 Diaphragmatic hernia without obstruction or gangrene: Secondary | ICD-10-CM | POA: Diagnosis not present

## 2021-03-09 DIAGNOSIS — D123 Benign neoplasm of transverse colon: Secondary | ICD-10-CM | POA: Diagnosis not present

## 2021-03-09 DIAGNOSIS — Z882 Allergy status to sulfonamides status: Secondary | ICD-10-CM | POA: Diagnosis not present

## 2021-03-09 HISTORY — DX: Unspecified dementia, unspecified severity, without behavioral disturbance, psychotic disturbance, mood disturbance, and anxiety: F03.90

## 2021-03-09 HISTORY — PX: ESOPHAGOGASTRODUODENOSCOPY: SHX5428

## 2021-03-09 HISTORY — PX: COLONOSCOPY WITH PROPOFOL: SHX5780

## 2021-03-09 LAB — GLUCOSE, CAPILLARY: Glucose-Capillary: 73 mg/dL (ref 70–99)

## 2021-03-09 SURGERY — COLONOSCOPY WITH PROPOFOL
Anesthesia: General

## 2021-03-09 MED ORDER — LIDOCAINE HCL (CARDIAC) PF 100 MG/5ML IV SOSY
PREFILLED_SYRINGE | INTRAVENOUS | Status: DC | PRN
  Administered 2021-03-09: 60 mg via INTRAVENOUS

## 2021-03-09 MED ORDER — SODIUM CHLORIDE 0.9 % IV SOLN: INTRAVENOUS | Status: DC

## 2021-03-09 MED ORDER — PROPOFOL 500 MG/50ML IV EMUL
INTRAVENOUS | Status: DC | PRN
  Administered 2021-03-09: 120 ug/kg/min via INTRAVENOUS

## 2021-03-09 MED ORDER — PROPOFOL 500 MG/50ML IV EMUL
INTRAVENOUS | Status: AC
  Filled 2021-03-09: qty 50

## 2021-03-09 MED ORDER — LIDOCAINE HCL (PF) 2 % IJ SOLN
INTRAMUSCULAR | Status: AC
  Filled 2021-03-09: qty 5

## 2021-03-09 NOTE — Anesthesia Postprocedure Evaluation (Signed)
Anesthesia Post Note  Patient: Janet Gross  Procedure(s) Performed: COLONOSCOPY WITH PROPOFOL ESOPHAGOGASTRODUODENOSCOPY (EGD)  Patient location during evaluation: Endoscopy Anesthesia Type: General Level of consciousness: awake and alert Pain management: pain level controlled Vital Signs Assessment: post-procedure vital signs reviewed and stable Respiratory status: spontaneous breathing, nonlabored ventilation, respiratory function stable and patient connected to nasal cannula oxygen Cardiovascular status: blood pressure returned to baseline and stable Postop Assessment: no apparent nausea or vomiting Anesthetic complications: no   No notable events documented.   Last Vitals:  Vitals:   03/09/21 1210 03/09/21 1240  BP: (!) 158/66 (!) 176/79  Pulse: (!) 52 (!) 45  Temp:    SpO2: 100% 100%    Last Pain:  Vitals:   03/09/21 1240  TempSrc:   PainSc: 0-No pain                 Martha Clan

## 2021-03-09 NOTE — Anesthesia Procedure Notes (Signed)
Date/Time: 03/09/2021 10:53 AM Performed by: Vaughan Sine Pre-anesthesia Checklist: Patient identified, Emergency Drugs available, Suction available, Timeout performed and Patient being monitored Patient Re-evaluated:Patient Re-evaluated prior to induction Oxygen Delivery Method: Nasal cannula Preoxygenation: Pre-oxygenation with 100% oxygen Induction Type: IV induction Placement Confirmation: positive ETCO2 and CO2 detector

## 2021-03-09 NOTE — Telephone Encounter (Signed)
Called Pt to advise of her upcoming appts on 08/11. Left VM and advised of mailing out appt reminder.

## 2021-03-09 NOTE — Op Note (Addendum)
[facility] Gastroenterology Patient Name: Janet Gross Procedure Date: 03/09/2021 10:39 AM MRN: [id number] Account #: [id number] Date of Birth: 04-16-1931 Admit Type: Outpatient Age: 87 Room: [facility] ENDO ROOM 2 Gender: Female Note Status: Finalized Procedure:             Colonoscopy Indications:           Screening for colorectal malignant neoplasm Providers:             Tierra Divelbiss B. Bonna Gains MD, MD Referring MD:          Baxter Hire, MD (Referring MD) Medicines:             Monitored Anesthesia Care Complications:         No immediate complications. Procedure:             Pre-Anesthesia Assessment:                        - ASA Grade Assessment: III - A patient with severe                         systemic disease.                        - Prior to the procedure, a History and Physical was                         performed, and patient medications, allergies and                         sensitivities were reviewed. The patient's tolerance                         of previous anesthesia was reviewed.                        - The risks and benefits of the procedure and the                         sedation options and risks were discussed with the                         patient. All questions were answered and informed                         consent was obtained.                        - Patient identification and proposed procedure were                         verified prior to the procedure by the physician, the                         nurse, the anesthesiologist, the anesthetist and the                         technician. The procedure was verified in the                         procedure  room.                        After obtaining informed consent, the colonoscope was                         passed under direct vision. Throughout the procedure,                         the patient's blood pressure, pulse, and oxygen                         saturations were  monitored continuously. The                         Colonoscope was introduced through the anus and                         advanced to the the terminal ileum. The colonoscopy                         was performed with ease. The patient tolerated the                         procedure well. The quality of the bowel preparation                         was good. Findings:      The perianal and digital rectal examinations were normal.      Two sessile polyps were found in the transverse colon. The polyps were 4       to 5 mm in size. These polyps were removed with a cold snare. Resection       and retrieval were complete.      Multiple diverticula were found in the sigmoid colon.      The exam was otherwise without abnormality.      The rectum, sigmoid colon, descending colon, transverse colon, ascending       colon, cecum and ileum appeared normal.      Non-bleeding internal hemorrhoids were found during retroflexion. Impression:            - Two 4 to 5 mm polyps in the transverse colon,                         removed with a cold snare. Resected and retrieved.                        - Diverticulosis in the sigmoid colon.                        - The examination was otherwise normal.                        - The rectum, sigmoid colon, descending colon,                         transverse colon, ascending colon, cecum and terminal                         ileum are  normal.                        - Non-bleeding internal hemorrhoids. Recommendation:        - Refer to a hematologist, previously seen by Dr. Janese Banks                         in 2019.                        - Resume Plavix (clopidogrel) at prior dose today.                         Refer to managing physician for further adjustment of                         therapy.                        - Return to primary care physician in 2 weeks.                        - Discharge patient to home (with escort).                        - High fiber  diet.                        - Advance diet as tolerated.                        - Continue present medications.                        - Await pathology results.                        - Repeat colonoscopy date to be determined after                         pending pathology results are reviewed.                        - The findings and recommendations were discussed with                         the patient.                        - The findings and recommendations were discussed with                         the patient's family.                        - Return to primary care physician as previously                         scheduled.                        - Most recent iron and iron saturation were normal.  However, if workup by hematology reveals persistent                         iron deficiency anemia (his previous history of iron                         deficiency), capsule study can be considered. Patient                         may have had a capsule study in 2019 with [facility]                         clinic GI and we will try to obtain the records.                        - Above plan discussed with Eulogio Ditch from Vascular                         surgery, PCP, and Dr. Janese Banks of hematology and they state                         they will reach out to the patient to make an                         appointment                        - Patient's son states he specifically remembers when                         she had her capsule study in 2019 she had a lot of                         trouble swallowing it and they were at the facility                         for 2-3 hrs due to this. Will evaluate previous                         records to see if repeat capsule study in the near                         future would be safe or indicated based on hematology                         workup and labs                        - Given no active bleeding, improved  anemia, and                         recent vascular surgery interventions benefits of                         resuming her plavix would outweigh risks at this time.  Vascular surgery Eulogio Ditch states her clinic will                         reach out to the patient to manage this as well. Procedure Code(s):     --- Professional ---                        [phone], Colonoscopy, flexible; with removal of                         tumor(s), polyp(s), or other lesion(s) by snare                         technique Diagnosis Code(s):     --- Professional ---                        Z12.11, Encounter for screening for malignant neoplasm                         of colon                        K63.5, Polyp of colon CPT copyright 2019 American Medical Association. All rights reserved. The codes documented in this report are preliminary and upon coder review may  be revised to meet current compliance requirements.  [name], MD Margretta Sidle B. Bonna Gains MD, MD 03/09/2021 11:48:41 AM This report has been signed electronically. Number of Addenda: 0 Note Initiated On: 03/09/2021 10:39 AM Scope Withdrawal Time: 0 hours 19 minutes 29 seconds  Total Procedure Duration: 0 hours 26 minutes 12 seconds  Estimated Blood Loss:  Estimated blood loss: none.      [facility]

## 2021-03-09 NOTE — Anesthesia Preprocedure Evaluation (Signed)
Anesthesia Evaluation  Patient identified by MRN, date of birth, ID band Patient awake    Reviewed: Allergy & Precautions, H&P , NPO status , Patient's Chart, lab work & pertinent test results  History of Anesthesia Complications Negative for: history of anesthetic complications  Airway Mallampati: III  TM Distance: >3 FB Neck ROM: limited    Dental  (+) Poor Dentition, Missing, Upper Dentures, Lower Dentures, Dental Advidsory Given   Pulmonary shortness of breath (likely from anemia) and with exertion, neg recent URI, former smoker,    Pulmonary exam normal breath sounds clear to auscultation       Cardiovascular Exercise Tolerance: Good hypertension, (-) angina(-) Past MI, (-) Cardiac Stents and (-) DOE Normal cardiovascular exam(-) dysrhythmias (-) Valvular Problems/Murmurs Rhythm:regular Rate:Normal     Neuro/Psych PSYCHIATRIC DISORDERS Dementia negative neurological ROS     GI/Hepatic Neg liver ROS, PUD, GERD  Controlled,  Endo/Other  diabetes, Type 2Hypothyroidism   Renal/GU negative Renal ROS  negative genitourinary   Musculoskeletal   Abdominal   Peds  Hematology  (+) Blood dyscrasia, anemia ,   Anesthesia Other Findings Past Medical History: No date: Anemia 08/03/2016: B12 deficiency No date: Diabetes mellitus without complication (HCC) No date: Gastric ulcer No date: Hypothyroidism 02/04/2013: Onychomycosis No date: Osteoporosis No date: Peptic ulcer  Past Surgical History: No date: cataract left No date: COLONOSCOPY No date: ESOPHAGOGASTRODUODENOSCOPY No date: EXCISION VAGINAL CYST No date: neck artery     Comment: carotid endarterectomy     Reproductive/Obstetrics negative OB ROS                             Anesthesia Physical  Anesthesia Plan  ASA: 3  Anesthesia Plan: General   Post-op Pain Management:    Induction: Intravenous  PONV Risk Score and  Plan: 3 and TIVA and Propofol infusion  Airway Management Planned: Natural Airway and Nasal Cannula  Additional Equipment:   Intra-op Plan:   Post-operative Plan:   Informed Consent: I have reviewed the patients History and Physical, chart, labs and discussed the procedure including the risks, benefits and alternatives for the proposed anesthesia with the patient or authorized representative who has indicated his/her understanding and acceptance.     Dental Advisory Given  Plan Discussed with: Anesthesiologist, CRNA and Surgeon  Anesthesia Plan Comments:         Anesthesia Quick Evaluation

## 2021-03-09 NOTE — Telephone Encounter (Signed)
Called to inform son that patients' appt needs to be r/s'd per patients' GI. Son states that he will callback preferably tomorrow to r/s her appt.   Please r/s mesenteric and f/u

## 2021-03-09 NOTE — H&P (Signed)
[name], MD [address] [place], [place], [place], [address] [address], [place], [place], [place], [address] Phone: [phone]  Fax: [phone]  Primary Care Physician:  Baxter Hire, MD   Pre-Procedure History & Physical: HPI:  Janet Gross is a 87 y.o. female is here for a colonoscopy and EGD.   Past Medical History:  Diagnosis Date   Anemia    B12 deficiency 08/03/2016   Dementia ([place])    Diabetes mellitus without complication ([place])    Gastric ulcer    Hypothyroidism    Onychomycosis 02/04/2013   Osteoporosis    Peptic ulcer     Past Surgical History:  Procedure Laterality Date   cataract left     COLONOSCOPY     COLONOSCOPY WITH PROPOFOL N/A 11/13/2016   Procedure: COLONOSCOPY WITH PROPOFOL;  Surgeon: Lollie Sails, MD;  Location: [facility] ENDOSCOPY;  Service: Endoscopy;  Laterality: N/A;   ESOPHAGOGASTRODUODENOSCOPY     ESOPHAGOGASTRODUODENOSCOPY (EGD) WITH PROPOFOL N/A 11/13/2016   Procedure: ESOPHAGOGASTRODUODENOSCOPY (EGD) WITH PROPOFOL;  Surgeon: Lollie Sails, MD;  Location: [facility] ENDOSCOPY;  Service: Endoscopy;  Laterality: N/A;   EXCISION VAGINAL CYST     neck artery     carotid endarterectomy   VISCERAL ANGIOGRAPHY N/A 11/15/2020   Procedure: VISCERAL ANGIOGRAPHY;  Surgeon: Katha Cabal, MD;  Location: [place] CV LAB;  Service: Cardiovascular;  Laterality: N/A;    Prior to Admission medications   Medication Sig Start Date End Date Taking? Authorizing Provider  aspirin EC 81 MG tablet Take 1 tablet (81 mg total) by mouth daily. Hold until cleared by gastroenterology 03/07/21  Yes Lorella Nimrod, MD  atorvastatin (LIPITOR) 20 MG tablet Take 20 mg by mouth daily.   Yes [provider]  clopidogrel (PLAVIX) 75 MG tablet Take 1 tablet (75 mg total) by mouth daily. Please hold until cleared by gastroenterology 03/07/21  Yes Lorella Nimrod, MD  donepezil (ARICEPT) 5 MG tablet Take 5 mg by mouth at bedtime. 04/25/19  Yes  [provider]  esomeprazole (NEXIUM) 20 MG capsule Take 1 capsule (20 mg total) by mouth 2 (two) times daily before a meal. 03/07/21 03/07/22 Yes Lorella Nimrod, MD  ferrous sulfate 325 (65 FE) MG EC tablet Take 1 tablet (325 mg total) by mouth 2 (two) times daily. 03/07/21 03/07/22 Yes Lorella Nimrod, MD  gabapentin (NEURONTIN) 300 MG capsule Take 300 mg by mouth at bedtime.    Yes [provider]  glimepiride (AMARYL) 1 MG tablet Take 1 mg by mouth daily with breakfast. 12/18/18  Yes [provider]  levothyroxine (SYNTHROID) 100 MCG tablet 100 mcg daily before breakfast. 11/17/19  Yes [provider]  losartan (COZAAR) 25 MG tablet Take 25 mg by mouth daily.   Yes [provider]  memantine (NAMENDA) 10 MG tablet Take 10 mg by mouth 2 (two) times daily. 12/19/20  Yes [provider]  metFORMIN (GLUCOPHAGE) 1000 MG tablet Take 500 mg by mouth 2 (two) times daily with a meal.   Yes [provider]  Na Sulfate-K Sulfate-Mg Sulf (SUPREP BOWEL PREP KIT) 17.5-3.13-1.6 GM/177ML SOLN Take 1 kit by mouth as directed. 03/06/21  Yes Virgel Manifold, MD  vitamin B-12 (CYANOCOBALAMIN) 1000 MCG tablet Take 1,000 mcg by mouth daily.   Yes [provider]  feeding supplement, GLUCERNA SHAKE, (GLUCERNA SHAKE) LIQD Take 237 mLs by mouth 3 (three) times daily between meals. 03/07/21   Lorella Nimrod, MD  Multiple Vitamin (MULTIVITAMIN WITH MINERALS)  TABS tablet Take 1 tablet by mouth daily. 03/08/21   Lorella Nimrod, MD    Allergies as of 03/07/2021 - Review Complete 03/04/2021  Allergen Reaction Noted   Sulfa antibiotics Other (See Comments) 04/22/2013    Family History  Problem Relation Age of Onset   Heart attack Mother    Emphysema Father    Diabetes Sister    Diabetes Brother     Social History   Socioeconomic History   Marital status: Widowed    Spouse name: Not on file   Number of children: 1   Years of education: Not on file    Highest education level: Not on file  Occupational History   Occupation: retired    Comment: Pharmacist, hospital  Tobacco Use   Smoking status: Former    Packs/day: 0.25    Years: 10.00    Pack years: 2.50    Types: Cigarettes    Quit date: 08/07/1963    Years since quitting: 3.6   Smokeless tobacco: Never   Tobacco comments:    quit over 40 years ago  Vaping Use   Vaping Use: Never used  Substance and Sexual Activity   Alcohol use: No   Drug use: No   Sexual activity: Not on file  Other Topics Concern   Not on file  Social History Narrative   Lives by herself : 2 caregivers M-F; son cares for her on weekends    Social Determinants of Health   Financial Resource Strain: Not on file  Food Insecurity: Not on file  Transportation Needs: Not on file  Physical Activity: Not on file  Stress: Not on file  Social Connections: Not on file  Intimate Partner Violence: Not on file    Review of Systems: See HPI, otherwise negative ROS  Constitutional: General:   Alert,  Well-developed, well-nourished, pleasant and cooperative in NAD BP 138/63   Pulse (!) 51   Temp (!) 96.7 F (35.9 C) (Temporal)   Ht 5' 7"  (1.702 m)   Wt 60.3 kg   SpO2 100%   BMI 20.83 kg/m   Head: Normocephalic, atraumatic.   Eyes:  Sclera clear, no icterus.   Conjunctiva pink.   Mouth:  No deformity or lesions, oropharynx pink & moist.  Neck:  Supple, trachea midline  Respiratory: Normal respiratory effort  Gastrointestinal:  Soft, non-tender and non-distended without masses, hepatosplenomegaly or hernias noted.  No guarding or rebound tenderness.     Cardiac: No clubbing or edema.  No cyanosis. Normal posterior tibial pedal pulses noted.  Lymphatic:  No significant cervical adenopathy.  Psych:  Alert and cooperative. Normal mood and affect.  Musculoskeletal:   Symmetrical without gross deformities. 5/5 Lower extremity strength bilaterally.  Skin: Warm. Intact without significant lesions or rashes. No  jaundice.  Neurologic:  Face symmetrical, tongue midline, Normal sensation to touch;  grossly normal neurologically.  Psych:  Alert and oriented x3, Alert and cooperative. Normal mood and affect.  Impression/Plan: Janet Gross is here for a colonoscopy and EGD for iron deficiency anemia  Risks, benefits, limitations, and alternatives regarding the procedures have been reviewed with the patient.  Questions have been answered.  All parties agreeable.   Virgel Manifold, MD  03/09/2021, 10:48 AM

## 2021-03-09 NOTE — Transfer of Care (Signed)
Immediate Anesthesia Transfer of Care Note  Patient: Janet Gross  Procedure(s) Performed: COLONOSCOPY WITH PROPOFOL ESOPHAGOGASTRODUODENOSCOPY (EGD)  Patient Location: PACU  Anesthesia Type:General  Level of Consciousness: awake and sedated  Airway & Oxygen Therapy: Patient Spontanous Breathing and Patient connected to nasal cannula oxygen  Post-op Assessment: Report given to RN and Post -op Vital signs reviewed and stable  Post vital signs: Reviewed and stable  Last Vitals:  Vitals Value Taken Time  BP    Temp    Pulse    Resp    SpO2      Last Pain:  Vitals:   03/09/21 0955  TempSrc: Temporal  PainSc: 0-No pain         Complications: No notable events documented.

## 2021-03-09 NOTE — Op Note (Signed)
[facility] Gastroenterology Patient Name: Ellianne Tager Procedure Date: 03/09/2021 10:42 AM MRN: [id number] Account #: [id number] Date of Birth: 1930-08-08 Admit Type: Outpatient Age: 87 Room: [facility] ENDO ROOM 2 Gender: Female Note Status: Finalized Procedure:             Upper GI endoscopy Indications:           Iron deficiency anemia, Dysphagia Providers:             Kealani Leckey B. Bonna Gains MD, MD Referring MD:          Baxter Hire, MD (Referring MD) Medicines:             Monitored Anesthesia Care Complications:         No immediate complications. Procedure:             Pre-Anesthesia Assessment:                        - Prior to the procedure, a History and Physical was                         performed, and patient medications, allergies and                         sensitivities were reviewed. The patient's tolerance                         of previous anesthesia was reviewed.                        - The risks and benefits of the procedure and the                         sedation options and risks were discussed with the                         patient. All questions were answered and informed                         consent was obtained.                        - Patient identification and proposed procedure were                         verified prior to the procedure by the physician, the                         nurse, the anesthesiologist, the anesthetist and the                         technician. The procedure was verified in the                         procedure room.                        - ASA Grade Assessment: III - A patient with severe  systemic disease.                        After obtaining informed consent, the endoscope was                         passed under direct vision. Throughout the procedure,                         the patient's blood pressure, pulse, and oxygen                         saturations were  monitored continuously. The Endoscope                         was introduced through the mouth, and advanced to the                         second part of duodenum. The upper GI endoscopy was                         accomplished with ease. The patient tolerated the                         procedure well. Findings:      A moderate Schatzki ring was found at the gastroesophageal junction.       Passing the endoscope lead to mild heme effect which would be expected       after effective balloon dilation. Therefore, further dilation with a       balloon was not necessary.      The exam of the esophagus was otherwise normal.      Patchy mildly erythematous mucosa without bleeding was found in the       gastric antrum. Biopsies were not done due to patient's recent severe       anemia.      A hiatal hernia was present.      The exam of the stomach was otherwise normal.      The duodenal bulb, second portion of the duodenum and examined duodenum       were normal. Impression:            - Moderate Schatzki ring.                        - Erythematous mucosa in the antrum.                        - Hiatal hernia.                        - Normal duodenal bulb, second portion of the duodenum                         and examined duodenum.                        - No specimens collected. Recommendation:        - Await pathology results.                        - Perform  an H. pylori serology at appointment to be                         scheduled.                        - Return to GI clinic in 4 weeks.                        - Follow an antireflux regimen.                        - Discharge patient to home (with escort).                        - Advance diet as tolerated.                        - Continue present medications.                        - Patient has a contact number available for                         emergencies. The signs and symptoms of potential                         delayed  complications were discussed with the patient.                         Return to normal activities tomorrow. Written                         discharge instructions were provided to the patient.                        - Discharge patient to home (with escort).                        - The findings and recommendations were discussed with                         the patient.                        - The findings and recommendations were discussed with                         the patient's family. Procedure Code(s):     --- Professional ---                        [phone], Esophagogastroduodenoscopy, flexible,                         transoral; diagnostic, including collection of                         specimen(s) by brushing or washing, when performed                         (separate procedure) Diagnosis Code(s):     ---  Professional ---                        K22.2, Esophageal obstruction                        K31.89, Other diseases of stomach and duodenum                        K44.9, Diaphragmatic hernia without obstruction or                         gangrene                        D50.9, Iron deficiency anemia, unspecified                        R13.10, Dysphagia, unspecified CPT copyright 2019 American Medical Association. All rights reserved. The codes documented in this report are preliminary and upon coder review may  be revised to meet current compliance requirements.  [name], MD Margretta Sidle B. Bonna Gains MD, MD 03/09/2021 11:52:12 AM This report has been signed electronically. Number of Addenda: 0 Note Initiated On: 03/09/2021 10:42 AM Estimated Blood Loss:  Estimated blood loss: none.      [facility]

## 2021-03-10 LAB — SURGICAL PATHOLOGY

## 2021-03-13 ENCOUNTER — Other Ambulatory Visit: Payer: Self-pay | Admitting: Internal Medicine

## 2021-03-13 DIAGNOSIS — R1310 Dysphagia, unspecified: Secondary | ICD-10-CM

## 2021-03-16 ENCOUNTER — Inpatient Hospital Stay: Payer: Medicare PPO | Admitting: Nurse Practitioner

## 2021-03-16 ENCOUNTER — Inpatient Hospital Stay: Payer: Medicare PPO | Attending: Nurse Practitioner

## 2021-03-22 ENCOUNTER — Ambulatory Visit: Payer: Medicare PPO | Admitting: Gastroenterology

## 2021-03-30 ENCOUNTER — Encounter (INDEPENDENT_AMBULATORY_CARE_PROVIDER_SITE_OTHER): Payer: Medicare PPO

## 2021-03-30 ENCOUNTER — Ambulatory Visit (INDEPENDENT_AMBULATORY_CARE_PROVIDER_SITE_OTHER): Payer: Medicare PPO | Admitting: Nurse Practitioner

## 2021-03-31 ENCOUNTER — Other Ambulatory Visit: Payer: Self-pay

## 2021-03-31 ENCOUNTER — Ambulatory Visit (INDEPENDENT_AMBULATORY_CARE_PROVIDER_SITE_OTHER): Payer: Medicare PPO | Admitting: Nurse Practitioner

## 2021-03-31 ENCOUNTER — Ambulatory Visit (INDEPENDENT_AMBULATORY_CARE_PROVIDER_SITE_OTHER): Payer: Medicare PPO

## 2021-03-31 VITALS — BP 161/65 | HR 53 | Ht 67.0 in | Wt 129.0 lb

## 2021-03-31 DIAGNOSIS — E782 Mixed hyperlipidemia: Secondary | ICD-10-CM

## 2021-03-31 DIAGNOSIS — K551 Chronic vascular disorders of intestine: Secondary | ICD-10-CM

## 2021-03-31 DIAGNOSIS — I10 Essential (primary) hypertension: Secondary | ICD-10-CM

## 2021-03-31 NOTE — Progress Notes (Signed)
Subjective:    Patient ID: Janet Gross, female    DOB: 04-Nov-1930, 87 y.o.   MRN: [id number] Chief Complaint  Patient presents with  . 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  The patient denies any issues with abdominal pain currently.  The patient denies any significant weight loss although her son notes that her weight is down by about 1 to 2 pounds.  She denies any nausea or vomiting after meals and the patient's son notes that she is eating well currently.  Following her angiogram the patient continued to feel weak and have issues and it was noticed that the patient was severely anemic.  The patient's son notes that her hemoglobin was as low as 3.9 and she received 4 units of blood.  This was approximately 2 and half weeks ago.  Prior to this the patient did not have a good appetite but following the transfusions the patient began eating well.  She is currently working with the cancer center for iron infusions.  It is so far recovering nicely from her severe anemia.  Today noninvasive studies show that the proximal SFA stent is patent however just distal to the stent in the proximal to mid SMA it is suggestive of a greater than 75% stenosis.  This is an increase from the velocities on 12/07/2020.  The celiac hepatic and splenic artery velocities are all normal.  No evidence of an abdominal aortic aneurysm was noted.  Review of Systems  Respiratory:  Negative for shortness of breath.   Gastrointestinal:  Negative for abdominal pain, nausea and vomiting.  Musculoskeletal:  Positive for gait problem.  Neurological:  Positive for weakness.  All other systems reviewed and are negative.     Objective:   Physical Exam Vitals reviewed.  HENT:     Head: Normocephalic.  Cardiovascular:     Rate and Rhythm: Normal rate.     Pulses: Normal pulses.  Pulmonary:     Effort: Pulmonary effort  is normal.  Skin:    General: Skin is warm and dry.  Neurological:     Mental Status: She is alert and oriented to person, place, and time.     Motor: Weakness present.     Gait: Gait abnormal.  Psychiatric:        Mood and Affect: Mood normal.        Behavior: Behavior normal.        Thought Content: Thought content normal.        Judgment: Judgment normal.    BP (!) 161/65   Pulse (!) 53   Ht 5' 7"  (1.702 m)   Wt 129 lb (58.5 kg)   BMI 20.20 kg/m   Past Medical History:  Diagnosis Date  . Anemia   . B12 deficiency 08/03/2016  . Dementia ([place])   . Diabetes mellitus without complication ([place])   . Gastric ulcer   . Hypothyroidism   . Onychomycosis 02/04/2013  . Osteoporosis   . Peptic ulcer     Social History   Socioeconomic History  . Marital status: Widowed    Spouse name: Not on file  . Number of children: 1  . Years of education: Not on file  . Highest education level: Not on file  Occupational History  . Occupation: retired    Comment: Pharmacist, hospital  Tobacco Use  . Smoking status: Former  Packs/day: 0.25    Years: 10.00    Pack years: 2.50    Types: Cigarettes    Quit date: 08/07/1963    Years since quitting: 57.6  . Smokeless tobacco: Never  . Tobacco comments:    quit over 40 years ago  Vaping Use  . Vaping Use: Never used  Substance and Sexual Activity  . Alcohol use: No  . Drug use: No  . Sexual activity: Not on file  Other Topics Concern  . Not on file  Social History Narrative   Lives by herself : 2 caregivers M-F; son cares for her on weekends    Social Determinants of Health   Financial Resource Strain: Not on file  Food Insecurity: Not on file  Transportation Needs: Not on file  Physical Activity: Not on file  Stress: Not on file  Social Connections: Not on file  Intimate Partner Violence: Not on file    Past Surgical History:  Procedure Laterality Date  . cataract left    . COLONOSCOPY    . COLONOSCOPY WITH PROPOFOL N/A  11/13/2016   Procedure: COLONOSCOPY WITH PROPOFOL;  Surgeon: Lollie Sails, MD;  Location: [facility] ENDOSCOPY;  Service: Endoscopy;  Laterality: N/A;  . COLONOSCOPY WITH PROPOFOL N/A 03/09/2021   Procedure: COLONOSCOPY WITH PROPOFOL;  Surgeon: Virgel Manifold, MD;  Location: [facility] ENDOSCOPY;  Service: Endoscopy;  Laterality: N/A;  PATIENT HAS DEMENTIA PER SON "REACTS BETTER WHEN I AM PRESENT" PER SON  . ESOPHAGOGASTRODUODENOSCOPY    . ESOPHAGOGASTRODUODENOSCOPY N/A 03/09/2021   Procedure: ESOPHAGOGASTRODUODENOSCOPY (EGD);  Surgeon: Virgel Manifold, MD;  Location: [facility] ENDOSCOPY;  Service: Endoscopy;  Laterality: N/A;  . ESOPHAGOGASTRODUODENOSCOPY (EGD) WITH PROPOFOL N/A 11/13/2016   Procedure: ESOPHAGOGASTRODUODENOSCOPY (EGD) WITH PROPOFOL;  Surgeon: Lollie Sails, MD;  Location: [facility] ENDOSCOPY;  Service: Endoscopy;  Laterality: N/A;  . EXCISION VAGINAL CYST    . neck artery     carotid endarterectomy  . VISCERAL ANGIOGRAPHY N/A 11/15/2020   Procedure: VISCERAL ANGIOGRAPHY;  Surgeon: Katha Cabal, MD;  Location: [place] CV LAB;  Service: Cardiovascular;  Laterality: N/A;    Family History  Problem Relation Age of Onset  . Heart attack Mother   . Emphysema Father   . Diabetes Sister   . Diabetes Brother     Allergies  Allergen Reactions  . Sulfa Antibiotics Other (See Comments) and Hives    INCREASES HER BLOOD SUGAR , PT STATED  INCREASES HER BLOOD SUGAR , PT STATED  INCREASES HER BLOOD SUGAR , PT STATED  INCREASES HER BLOOD SUGAR , PT STATED  INCREASES HER BLOOD SUGAR , PT STATED  INCREASES HER BLOOD SUGAR , PT STATED     CBC Latest Ref Rng & Units 03/07/2021 03/06/2021 03/06/2021  WBC 4.0 - 10.5 K/uL 5.3 6.3 4.9  Hemoglobin 12.0 - 15.0 g/dL 10.3(L) 9.6(L) 10.2(L)  Hematocrit 36.0 - 46.0 % 32.0(L) 29.7(L) 31.5(L)  Platelets 150 - 400 K/uL 333 297 326      CMP     Component Value Date/Time   NA 139 03/06/2021 0631   NA 139 08/19/2013 1919   K 3.4 (L)  03/06/2021 0631   K 3.8 08/19/2013 1919   CL 108 03/06/2021 0631   CL 106 08/19/2013 1919   CO2 24 03/06/2021 0631   CO2 29 08/19/2013 1919   GLUCOSE 92 03/06/2021 0631   GLUCOSE 52 (L) 08/19/2013 1919   BUN 7 (L) 03/06/2021 0631   BUN 14 08/19/2013 1919   CREATININE 0.77  03/06/2021 0631   CREATININE 0.81 08/19/2013 1919   CALCIUM 8.6 (L) 03/06/2021 0631   CALCIUM 9.3 08/19/2013 1919   PROT 6.5 03/04/2021 2023   ALBUMIN 3.7 03/04/2021 2023   AST 17 03/04/2021 2023   ALT 14 03/04/2021 2023   ALKPHOS 56 03/04/2021 2023   BILITOT 0.4 03/04/2021 2023   GFRNONAA >60 03/06/2021 0631   GFRNONAA >60 08/19/2013 1919   GFRAA >60 07/14/2015 0535   GFRAA >60 08/19/2013 1919     No results found.     Assessment & Plan:   1. Chronic mesenteric ischemia (HCC) All the patient's noninvasive studies today have elevated velocities, the patient is not exhibiting any symptoms of worsening mesenteric stenosis.  Given her recent issues with severe anemia she still weakened from this although she is currently undergoing treatment.  We will proceed conservatively and have the patient follow-up in 3 months to repeat noninvasive studies.  However, the patient center advised that if she begins to have a decreased appetite or he has experienced nausea and vomiting or pain after meals they should contact her office.  Additionally, she begins to suffer abdominal pain or significant weight loss these are all concerns that intervention may be needed sooner.  2. Essential hypertension Continue antihypertensive medications as already ordered, these medications have been reviewed and there are no changes at this time.   3. Mixed hyperlipidemia Continue statin as ordered and reviewed, no changes at this time    Current Outpatient Medications on File Prior to Visit  Medication Sig Dispense Refill  . aspirin EC 81 MG tablet Take 1 tablet (81 mg total) by mouth daily. Hold until cleared by gastroenterology 150  tablet 2  . atorvastatin (LIPITOR) 20 MG tablet Take 20 mg by mouth daily.    . clopidogrel (PLAVIX) 75 MG tablet Take 1 tablet (75 mg total) by mouth daily. Please hold until cleared by gastroenterology 90 tablet 1  . donepezil (ARICEPT) 5 MG tablet Take 5 mg by mouth at bedtime.    Marland Kitchen esomeprazole (NEXIUM) 20 MG capsule Take 1 capsule (20 mg total) by mouth 2 (two) times daily before a meal. 60 capsule 1  . feeding supplement, GLUCERNA SHAKE, (GLUCERNA SHAKE) LIQD Take 237 mLs by mouth 3 (three) times daily between meals. 1000 mL 12  . ferrous sulfate 325 (65 FE) MG EC tablet Take 1 tablet (325 mg total) by mouth 2 (two) times daily. 60 tablet 3  . gabapentin (NEURONTIN) 300 MG capsule Take 300 mg by mouth at bedtime.     . gabapentin (NEURONTIN) 300 MG capsule Take by mouth.    Marland Kitchen glimepiride (AMARYL) 1 MG tablet Take 1 mg by mouth daily with breakfast.    . levothyroxine (SYNTHROID) 100 MCG tablet 100 mcg daily before breakfast.    . levothyroxine (SYNTHROID) 100 MCG tablet Take by mouth.    . losartan (COZAAR) 25 MG tablet Take 25 mg by mouth daily.    Marland Kitchen losartan (COZAAR) 25 MG tablet Take 1 tablet by mouth daily.    . memantine (NAMENDA) 10 MG tablet Take 10 mg by mouth 2 (two) times daily.    . metFORMIN (GLUCOPHAGE) 1000 MG tablet Take 500 mg by mouth 2 (two) times daily with a meal.    . metFORMIN (GLUCOPHAGE) 1000 MG tablet Take 1 tablet by mouth 2 (two) times daily with a meal.    . Multiple Vitamin (MULTIVITAMIN WITH MINERALS) TABS tablet Take 1 tablet by mouth daily. 90 tablet 0  .  Na Sulfate-K Sulfate-Mg Sulf (SUPREP BOWEL PREP KIT) 17.5-3.13-1.6 GM/177ML SOLN Take 1 kit by mouth as directed. 354 mL 0  . vitamin B-12 (CYANOCOBALAMIN) 1000 MCG tablet Take 1,000 mcg by mouth daily.     Current Facility-Administered Medications on File Prior to Visit  Medication Dose Route Frequency Provider Last Rate Last Admin  . cyanocobalamin ((VITAMIN B-12)) injection 1,000 mcg  1,000 mcg  Intramuscular Once Sindy Guadeloupe, MD        There are no Patient Instructions on file for this visit. No follow-ups on file.   Kris Hartmann, NP

## 2021-04-01 ENCOUNTER — Encounter (INDEPENDENT_AMBULATORY_CARE_PROVIDER_SITE_OTHER): Payer: Self-pay | Admitting: Nurse Practitioner

## 2021-04-20 ENCOUNTER — Inpatient Hospital Stay: Payer: Medicare PPO | Attending: Nurse Practitioner | Admitting: Nurse Practitioner

## 2021-04-20 ENCOUNTER — Encounter (INDEPENDENT_AMBULATORY_CARE_PROVIDER_SITE_OTHER): Payer: Self-pay

## 2021-04-20 ENCOUNTER — Encounter: Payer: Self-pay | Admitting: Nurse Practitioner

## 2021-04-20 ENCOUNTER — Inpatient Hospital Stay: Payer: Medicare PPO

## 2021-04-20 ENCOUNTER — Other Ambulatory Visit: Payer: Self-pay

## 2021-04-20 VITALS — BP 108/62 | HR 52 | Temp 96.2°F | Resp 20 | Wt 129.3 lb

## 2021-04-20 VITALS — BP 110/68 | HR 60 | Resp 16

## 2021-04-20 DIAGNOSIS — E119 Type 2 diabetes mellitus without complications: Secondary | ICD-10-CM | POA: Diagnosis not present

## 2021-04-20 DIAGNOSIS — E538 Deficiency of other specified B group vitamins: Secondary | ICD-10-CM | POA: Diagnosis not present

## 2021-04-20 DIAGNOSIS — D500 Iron deficiency anemia secondary to blood loss (chronic): Secondary | ICD-10-CM

## 2021-04-20 DIAGNOSIS — Z87891 Personal history of nicotine dependence: Secondary | ICD-10-CM | POA: Diagnosis not present

## 2021-04-20 DIAGNOSIS — Z79899 Other long term (current) drug therapy: Secondary | ICD-10-CM | POA: Diagnosis not present

## 2021-04-20 DIAGNOSIS — Z7901 Long term (current) use of anticoagulants: Secondary | ICD-10-CM

## 2021-04-20 DIAGNOSIS — D509 Iron deficiency anemia, unspecified: Secondary | ICD-10-CM

## 2021-04-20 DIAGNOSIS — D72819 Decreased white blood cell count, unspecified: Secondary | ICD-10-CM | POA: Diagnosis not present

## 2021-04-20 LAB — IRON AND TIBC
Iron: 75 ug/dL (ref 28–170)
Saturation Ratios: 20 % (ref 10.4–31.8)
TIBC: 377 ug/dL (ref 250–450)
UIBC: 302 ug/dL

## 2021-04-20 LAB — CBC WITH DIFFERENTIAL/PLATELET
Abs Immature Granulocytes: 0.01 10*3/uL (ref 0.00–0.07)
Basophils Absolute: 0 10*3/uL (ref 0.0–0.1)
Basophils Relative: 1 %
Eosinophils Absolute: 0.1 10*3/uL (ref 0.0–0.5)
Eosinophils Relative: 1 %
HCT: 35 % — ABNORMAL LOW (ref 36.0–46.0)
Hemoglobin: 10.7 g/dL — ABNORMAL LOW (ref 12.0–15.0)
Immature Granulocytes: 0 %
Lymphocytes Relative: 26 %
Lymphs Abs: 1 10*3/uL (ref 0.7–4.0)
MCH: 24.4 pg — ABNORMAL LOW (ref 26.0–34.0)
MCHC: 30.6 g/dL (ref 30.0–36.0)
MCV: 79.7 fL — ABNORMAL LOW (ref 80.0–100.0)
Monocytes Absolute: 0.3 10*3/uL (ref 0.1–1.0)
Monocytes Relative: 8 %
Neutro Abs: 2.6 10*3/uL (ref 1.7–7.7)
Neutrophils Relative %: 64 %
Platelets: 229 10*3/uL (ref 150–400)
RBC: 4.39 MIL/uL (ref 3.87–5.11)
RDW: 23 % — ABNORMAL HIGH (ref 11.5–15.5)
WBC: 4.1 10*3/uL (ref 4.0–10.5)
nRBC: 0 % (ref 0.0–0.2)

## 2021-04-20 LAB — COMPREHENSIVE METABOLIC PANEL
ALT: 9 U/L (ref 0–44)
AST: 15 U/L (ref 15–41)
Albumin: 3.9 g/dL (ref 3.5–5.0)
Alkaline Phosphatase: 55 U/L (ref 38–126)
Anion gap: 4 — ABNORMAL LOW (ref 5–15)
BUN: 14 mg/dL (ref 8–23)
CO2: 29 mmol/L (ref 22–32)
Calcium: 9 mg/dL (ref 8.9–10.3)
Chloride: 105 mmol/L (ref 98–111)
Creatinine, Ser: 0.86 mg/dL (ref 0.44–1.00)
GFR, Estimated: >60 mL/min (ref >60)
Glucose, Bld: 162 mg/dL — ABNORMAL HIGH (ref 70–99)
Potassium: 3.6 mmol/L (ref 3.5–5.1)
Sodium: 138 mmol/L (ref 135–145)
Total Bilirubin: 0.9 mg/dL (ref 0.3–1.2)
Total Protein: 6.9 g/dL (ref 6.5–8.1)

## 2021-04-20 LAB — FERRITIN: Ferritin: 9 ng/mL — ABNORMAL LOW (ref 11–307)

## 2021-04-20 MED ORDER — SODIUM CHLORIDE 0.9 % IV SOLN: 200.0000 mg | Freq: Once | INTRAVENOUS | Status: DC

## 2021-04-20 MED ORDER — IRON SUCROSE 20 MG/ML IV SOLN
200.0000 mg | Freq: Once | INTRAVENOUS | Status: AC
  Administered 2021-04-20: 200 mg via INTRAVENOUS
  Filled 2021-04-20: qty 10

## 2021-04-20 MED ORDER — SODIUM CHLORIDE 0.9 % IV SOLN
Freq: Once | INTRAVENOUS | Status: AC
  Filled 2021-04-20: qty 250

## 2021-04-20 NOTE — Progress Notes (Signed)
Hematology/Oncology Consult Note [facility]  [phone] [phone]  Patient Care Team: Baxter Hire, MD as PCP - General (Internal Medicine)   Name of the patient: Janet Gross  [id number]  [birth year]   Date of visit: 04/20/21  Diagnosis- iron deficiency anemia  Chief complaint/ Reason for visit- Anemia  Heme/Onc history: Patient returns to clinic for evaluation of anemia. She is previously known to [facility] and was seen by Dr. Janese Banks in 2019 for history of iron deficiency anemia. Previously, patient was initially seen by Dr. Sherrine Maples for evaluation of iron deficiency anemia on 06/27/2016. At that time she was referred from her PCP for hemoglobin of 6.3 and iron saturation of 14%. CT chest from 05/26/2013 showed a large cyst involving the midportion of the left kidney measuring up to 4.9 x 5.6 cm. Last known normal hemoglobin was 11 on 05/26/2015. She was given 2 doses of ferraheme given that she was significantly anemic although minimally symptomatic.Marland Kitchen She has been seen by [facility] gastroenterology in the past. Patient apparently had a colonoscopy in 2006 which showed a tubular adenoma. Also had a repeat colonoscopy on 06/27/2012 which again showed tubular adenoma and a repeat colonoscopy in 3 years was recommended. She has also had EGD in 2006 and 2013. History of peptic ulcer many years ago. Labs on 06/27/2016 showed ferritin of 3, low iron of 9, elevated TIBC of 476. H&H were 6/19.8 with an MCV of 62.9. B12 levels were low normal at 182   Feraheme was in feb 2018  Colonoscopy April 2018-2 mm polyp hepatic flexure, 2 mm polyps cecum, 5 mm polyp rectum, diverticulosis sigmoid, descending, transverse. Path w/ 2 TAs  EGD 2018-low grade narrowing at GEJ, grade B esophagitis, gastritis, duodenitis. Negative H. Pylori, gastritis.    Interval history-patient with medical history significant for diabetes mellitus, hypertension, dementia,  chronic mesenteric ischemia status post SMA stent on DAPT, chronic anticoagulation with Plavix, presents for evaluation of anemia.  She has not been seen at the cancer center since 2019 at which time she was lost to follow-up for iron deficiency anemia.  She presented to the ER on 03/04/2021 with profound weakness, slurred speech, and was found to be anemic with hemoglobin of 3.9.  Previously hemoglobin was 12.94 months ago.  She received a total of 4 units of PRBCs and hemoglobin was 10.3 on discharge.  Per patient's son she normally walks a mile daily and over the past several weeks she has become increasingly fatigued and winded when exerting herself.  Due to dementia she has 24-hour care at her home.  She is unable to tell me if she has black or bloody stools but per son and caregivers she has not.  Plan is for her to see GI outpatient for EGD and colonoscopy as no obvious source of bleeding was found.  For slurred speech she underwent CT head and MRI which were negative for acute abnormality.  Per son her aspirin continues to be held but she was restarted Plavix.   ECOG PS- 1 Pain scale- 0 Opioid associated constipation- no  Review of systems- Review of Systems  Unable to perform ROS: Dementia    Allergies  Allergen Reactions   Sulfa Antibiotics Other (See Comments) and Hives    INCREASES HER BLOOD SUGAR , PT STATED  INCREASES HER BLOOD SUGAR , PT STATED  INCREASES HER BLOOD SUGAR , PT STATED  INCREASES HER BLOOD SUGAR , PT STATED  INCREASES HER BLOOD SUGAR , PT  STATED  INCREASES HER BLOOD SUGAR , PT STATED      Past Medical History:  Diagnosis Date   Anemia    B12 deficiency 08/03/2016   Dementia ([place])    Diabetes mellitus without complication ([place])    Gastric ulcer    Hypothyroidism    Onychomycosis 02/04/2013   Osteoporosis    Peptic ulcer      Past Surgical History:  Procedure Laterality Date   cataract left     COLONOSCOPY     COLONOSCOPY WITH PROPOFOL N/A 11/13/2016    Procedure: COLONOSCOPY WITH PROPOFOL;  Surgeon: Lollie Sails, MD;  Location: [facility] ENDOSCOPY;  Service: Endoscopy;  Laterality: N/A;   COLONOSCOPY WITH PROPOFOL N/A 03/09/2021   Procedure: COLONOSCOPY WITH PROPOFOL;  Surgeon: Virgel Manifold, MD;  Location: [facility] ENDOSCOPY;  Service: Endoscopy;  Laterality: N/A;  PATIENT HAS DEMENTIA PER SON "REACTS BETTER WHEN I AM PRESENT" PER SON   ESOPHAGOGASTRODUODENOSCOPY     ESOPHAGOGASTRODUODENOSCOPY N/A 03/09/2021   Procedure: ESOPHAGOGASTRODUODENOSCOPY (EGD);  Surgeon: Virgel Manifold, MD;  Location: [facility] ENDOSCOPY;  Service: Endoscopy;  Laterality: N/A;   ESOPHAGOGASTRODUODENOSCOPY (EGD) WITH PROPOFOL N/A 11/13/2016   Procedure: ESOPHAGOGASTRODUODENOSCOPY (EGD) WITH PROPOFOL;  Surgeon: Lollie Sails, MD;  Location: [facility] ENDOSCOPY;  Service: Endoscopy;  Laterality: N/A;   EXCISION VAGINAL CYST     neck artery     carotid endarterectomy   VISCERAL ANGIOGRAPHY N/A 11/15/2020   Procedure: VISCERAL ANGIOGRAPHY;  Surgeon: Katha Cabal, MD;  Location: [place] CV LAB;  Service: Cardiovascular;  Laterality: N/A;    Social History   Socioeconomic History   Marital status: Widowed    Spouse name: Not on file   Number of children: 1   Years of education: Not on file   Highest education level: Not on file  Occupational History   Occupation: retired    Comment: Pharmacist, hospital  Tobacco Use   Smoking status: Former    Packs/day: 0.25    Years: 10.00    Pack years: 2.50    Types: Cigarettes    Quit date: 08/07/1963    Years since quitting: 57.7   Smokeless tobacco: Never   Tobacco comments:    quit over 40 years ago  Vaping Use   Vaping Use: Never used  Substance and Sexual Activity   Alcohol use: No   Drug use: No   Sexual activity: Not on file  Other Topics Concern   Not on file  Social History Narrative   Lives by herself : 2 caregivers M-F; son cares for her on weekends    Social Determinants of Health   Financial  Resource Strain: Not on file  Food Insecurity: Not on file  Transportation Needs: Not on file  Physical Activity: Not on file  Stress: Not on file  Social Connections: Not on file  Intimate Partner Violence: Not on file    Family History  Problem Relation Age of Onset   Heart attack Mother    Emphysema Father    Diabetes Sister    Diabetes Brother      Current Outpatient Medications:    atorvastatin (LIPITOR) 20 MG tablet, Take 20 mg by mouth daily., Disp: , Rfl:    clopidogrel (PLAVIX) 75 MG tablet, Take 1 tablet (75 mg total) by mouth daily. Please hold until cleared by gastroenterology, Disp: 90 tablet, Rfl: 1   donepezil (ARICEPT) 5 MG tablet, Take 5 mg by mouth at bedtime., Disp: , Rfl:    esomeprazole ([place]) 20  MG capsule, Take 1 capsule (20 mg total) by mouth 2 (two) times daily before a meal., Disp: 60 capsule, Rfl: 1   feeding supplement, GLUCERNA SHAKE, (GLUCERNA SHAKE) LIQD, Take 237 mLs by mouth 3 (three) times daily between meals., Disp: 1000 mL, Rfl: 12   ferrous sulfate 325 (65 FE) MG EC tablet, Take 1 tablet (325 mg total) by mouth 2 (two) times daily., Disp: 60 tablet, Rfl: 3   gabapentin (NEURONTIN) 300 MG capsule, Take 300 mg by mouth at bedtime. , Disp: , Rfl:    gabapentin (NEURONTIN) 300 MG capsule, Take by mouth., Disp: , Rfl:    glimepiride (AMARYL) 1 MG tablet, Take 1 mg by mouth daily with breakfast., Disp: , Rfl:    levothyroxine (SYNTHROID) 100 MCG tablet, 100 mcg daily before breakfast., Disp: , Rfl:    levothyroxine (SYNTHROID) 100 MCG tablet, Take by mouth., Disp: , Rfl:    losartan (COZAAR) 25 MG tablet, Take 25 mg by mouth daily., Disp: , Rfl:    losartan (COZAAR) 25 MG tablet, Take 1 tablet by mouth daily., Disp: , Rfl:    memantine (NAMENDA) 10 MG tablet, Take 10 mg by mouth 2 (two) times daily., Disp: , Rfl:    metFORMIN (GLUCOPHAGE) 1000 MG tablet, Take 500 mg by mouth 2 (two) times daily with a meal., Disp: , Rfl:    metFORMIN (GLUCOPHAGE)  1000 MG tablet, Take 1 tablet by mouth 2 (two) times daily with a meal., Disp: , Rfl:    Multiple Vitamin (MULTIVITAMIN WITH MINERALS) TABS tablet, Take 1 tablet by mouth daily., Disp: 90 tablet, Rfl: 0   Na Sulfate-K Sulfate-Mg Sulf (SUPREP BOWEL PREP KIT) 17.5-3.13-1.6 GM/177ML SOLN, Take 1 kit by mouth as directed., Disp: 354 mL, Rfl: 0   vitamin B-12 (CYANOCOBALAMIN) 1000 MCG tablet, Take 1,000 mcg by mouth daily., Disp: , Rfl:    aspirin EC 81 MG tablet, Take 1 tablet (81 mg total) by mouth daily. Hold until cleared by gastroenterology (Patient not taking: Reported on 04/20/2021), Disp: 150 tablet, Rfl: 2 No current facility-administered medications for this visit.  Facility-Administered Medications Ordered in Other Visits:    cyanocobalamin ((VITAMIN B-12)) injection 1,000 mcg, 1,000 mcg, Intramuscular, Once, Sindy Guadeloupe, MD  Physical exam:  Vitals:   04/20/21 1042  BP: 108/62  Pulse: (!) 52  Resp: 20  Temp: (!) 96.2 F (35.7 C)  SpO2: 100%  Weight: 129 lb 4.8 oz (58.7 kg)   Physical Exam Constitutional:      Comments: Eating ice. Accompanied by son.   HENT:     Head: Normocephalic and atraumatic.  Eyes:     Conjunctiva/sclera: Conjunctivae normal.  Cardiovascular:     Rate and Rhythm: Normal rate and regular rhythm.     Heart sounds: Normal heart sounds.  Pulmonary:     Effort: Pulmonary effort is normal. No respiratory distress.     Breath sounds: Normal breath sounds.  Abdominal:     General: Bowel sounds are normal. There is no distension.     Palpations: Abdomen is soft.     Tenderness: There is no abdominal tenderness. There is no guarding.  Musculoskeletal:        General: No deformity.     Cervical back: Normal range of motion.  Skin:    General: Skin is warm and dry.     Coloration: Skin is not pale.  Neurological:     Mental Status: She is alert. Mental status is at baseline. She is disoriented.  Gait: Gait normal.  Psychiatric:        Mood and  Affect: Mood is anxious.        Speech: Speech normal.        Behavior: Behavior is cooperative.        Cognition and Memory: Cognition is impaired. Memory is impaired.     CMP Latest Ref Rng & Units 04/20/2021  Glucose 70 - 99 mg/dL 162(H)  BUN 8 - 23 mg/dL 14  Creatinine 0.44 - 1.00 mg/dL 0.86  Sodium 135 - 145 mmol/L 138  Potassium 3.5 - 5.1 mmol/L 3.6  Chloride 98 - 111 mmol/L 105  CO2 22 - 32 mmol/L 29  Calcium 8.9 - 10.3 mg/dL 9.0  Total Protein 6.5 - 8.1 g/dL 6.9  Total Bilirubin 0.3 - 1.2 mg/dL 0.9  Alkaline Phos 38 - 126 U/L 55  AST 15 - 41 U/L 15  ALT 0 - 44 U/L 9   CBC Latest Ref Rng & Units 04/20/2021  WBC 4.0 - 10.5 K/uL 4.1  Hemoglobin 12.0 - 15.0 g/dL 10.7(L)  Hematocrit 36.0 - 46.0 % 35.0(L)  Platelets 150 - 400 K/uL 229   Iron/TIBC/Ferritin/ %Sat    Component Value Date/Time   IRON 75 04/20/2021 1015   TIBC 377 04/20/2021 1015   FERRITIN 9 (L) 04/20/2021 1015   IRONPCTSAT 20 04/20/2021 1015     Assessment and plan- Patient is a 87 y.o. female with following issues:  1. Iron deficiency anemia- Hx of significant anemia with recenthospitalization for hemoglobin of 3.9 now s/o transfusion x 4 units of pRBCs. Iron studies from today are pending at time of visit. Iron saturation 20%, ferritin 9. Hemoglobin 10.3. She is symptomatic. She would benefit from IV iron. Plan for venofer x 5. RTC in 3 months for labs (cbc, ferritin, iron studies), then day to week later, see MD/Rao to re-establish care.   2. B12 deficiency- B12 levels were normal, 1061, in March 2022. She continues oral b12 supplementation. Monitor.   3. Leukopenia- WBC normal today. Previously around 3. Could be b12 deficiency vs MDS. Monitor.   4. Chronic anticoagulation- she has restarted plavix. Aspirin on hold.   5. GI Bleed- s/p colonoscopy and EGD on 03/09/21 with Dr. Bonna Gains.   6. Dementia- patient's son is Scientist, research (medical). Monitor.    Visit Diagnosis 1. Iron deficiency anemia,  unspecified iron deficiency anemia type   2. Chronic anticoagulation   3. Iron deficiency anemia due to chronic blood loss    Beckey Rutter, DNP, AGNP-C [place] [facility] [phone] (clinic) 04/20/2021  CC: Dr. Janese Banks

## 2021-04-21 ENCOUNTER — Ambulatory Visit: Payer: Medicare PPO

## 2021-04-25 ENCOUNTER — Inpatient Hospital Stay: Payer: Medicare PPO

## 2021-04-25 VITALS — BP 141/62 | HR 54 | Temp 97.6°F | Resp 16

## 2021-04-25 DIAGNOSIS — D500 Iron deficiency anemia secondary to blood loss (chronic): Secondary | ICD-10-CM | POA: Diagnosis not present

## 2021-04-25 MED ORDER — SODIUM CHLORIDE 0.9 % IV SOLN: 200.0000 mg | Freq: Once | INTRAVENOUS | Status: DC

## 2021-04-25 MED ORDER — IRON SUCROSE 20 MG/ML IV SOLN
200.0000 mg | Freq: Once | INTRAVENOUS | Status: AC
  Administered 2021-04-25: 200 mg via INTRAVENOUS
  Filled 2021-04-25: qty 10

## 2021-04-25 MED ORDER — SODIUM CHLORIDE 0.9 % IV SOLN
Freq: Once | INTRAVENOUS | Status: AC
  Filled 2021-04-25: qty 250

## 2021-04-25 NOTE — Patient Instructions (Signed)

## 2021-04-28 ENCOUNTER — Encounter: Payer: Self-pay | Admitting: Oncology

## 2021-05-02 ENCOUNTER — Inpatient Hospital Stay: Payer: Medicare PPO

## 2021-05-02 ENCOUNTER — Other Ambulatory Visit: Payer: Self-pay

## 2021-05-02 VITALS — BP 155/70 | HR 56 | Temp 98.4°F | Resp 16

## 2021-05-02 DIAGNOSIS — D500 Iron deficiency anemia secondary to blood loss (chronic): Secondary | ICD-10-CM | POA: Diagnosis not present

## 2021-05-02 MED ORDER — SODIUM CHLORIDE 0.9 % IV SOLN: 200.0000 mg | Freq: Once | INTRAVENOUS | Status: DC

## 2021-05-02 MED ORDER — IRON SUCROSE 20 MG/ML IV SOLN
200.0000 mg | Freq: Once | INTRAVENOUS | Status: AC
  Administered 2021-05-02: 200 mg via INTRAVENOUS
  Filled 2021-05-02: qty 10

## 2021-05-02 MED ORDER — SODIUM CHLORIDE 0.9 % IV SOLN
Freq: Once | INTRAVENOUS | Status: AC
  Filled 2021-05-02: qty 250

## 2021-05-02 NOTE — Patient Instructions (Signed)

## 2021-05-09 ENCOUNTER — Inpatient Hospital Stay: Payer: Medicare PPO | Attending: Nurse Practitioner

## 2021-05-09 VITALS — BP 101/75 | HR 65 | Temp 96.8°F | Resp 18

## 2021-05-09 DIAGNOSIS — D500 Iron deficiency anemia secondary to blood loss (chronic): Secondary | ICD-10-CM

## 2021-05-09 DIAGNOSIS — D509 Iron deficiency anemia, unspecified: Secondary | ICD-10-CM | POA: Diagnosis not present

## 2021-05-09 MED ORDER — IRON SUCROSE 20 MG/ML IV SOLN
200.0000 mg | Freq: Once | INTRAVENOUS | Status: AC
  Administered 2021-05-09: 200 mg via INTRAVENOUS
  Filled 2021-05-09: qty 10

## 2021-05-09 MED ORDER — SODIUM CHLORIDE 0.9 % IV SOLN: 200.0000 mg | Freq: Once | INTRAVENOUS | Status: DC

## 2021-05-09 MED ORDER — SODIUM CHLORIDE 0.9 % IV SOLN
Freq: Once | INTRAVENOUS | Status: AC
Start: 2021-05-09 — End: 2021-05-09
  Filled 2021-05-09: qty 250

## 2021-05-16 ENCOUNTER — Other Ambulatory Visit: Payer: Self-pay

## 2021-05-16 ENCOUNTER — Inpatient Hospital Stay: Payer: Medicare PPO

## 2021-05-16 VITALS — BP 121/62 | HR 64 | Temp 98.5°F | Resp 18

## 2021-05-16 DIAGNOSIS — D509 Iron deficiency anemia, unspecified: Secondary | ICD-10-CM | POA: Diagnosis not present

## 2021-05-16 DIAGNOSIS — D500 Iron deficiency anemia secondary to blood loss (chronic): Secondary | ICD-10-CM

## 2021-05-16 MED ORDER — SODIUM CHLORIDE 0.9 % IV SOLN
Freq: Once | INTRAVENOUS | Status: AC
  Filled 2021-05-16: qty 250

## 2021-05-16 MED ORDER — SODIUM CHLORIDE 0.9 % IV SOLN: 200.0000 mg | Freq: Once | INTRAVENOUS | Status: DC

## 2021-05-16 MED ORDER — IRON SUCROSE 20 MG/ML IV SOLN
200.0000 mg | Freq: Once | INTRAVENOUS | Status: AC
Start: 2021-05-16 — End: 2021-05-16
  Administered 2021-05-16: 200 mg via INTRAVENOUS
  Filled 2021-05-16: qty 10

## 2021-05-16 NOTE — Patient Instructions (Signed)
[facility] [place] [facility]   Discharge Instructions: Thank you for choosing [facility] to provide your oncology and hematology care.  If you have a lab appointment with the Cancer Center, please go directly to the Cancer Center and check in at the registration area.  We strive to give you quality time with your provider. You may need to reschedule your appointment if you arrive late (15 or more minutes).  Arriving late affects you and other patients whose appointments are after yours.  Also, if you miss three or more appointments without notifying the office, you may be dismissed from the clinic at the provider's discretion.      For prescription refill requests, have your pharmacy contact our office and allow 72 hours for refills to be completed.    Today you received the following: Venofer.      BELOW ARE SYMPTOMS THAT SHOULD BE REPORTED IMMEDIATELY: *FEVER GREATER THAN 100.4 F (38 C) OR HIGHER *CHILLS OR SWEATING *NAUSEA AND VOMITING THAT IS NOT CONTROLLED WITH YOUR NAUSEA MEDICATION *UNUSUAL SHORTNESS OF BREATH *UNUSUAL BRUISING OR BLEEDING *URINARY PROBLEMS (pain or burning when urinating, or frequent urination) *BOWEL PROBLEMS (unusual diarrhea, constipation, pain near the anus) TENDERNESS IN MOUTH AND THROAT WITH OR WITHOUT PRESENCE OF ULCERS (sore throat, sores in mouth, or a toothache) UNUSUAL RASH, SWELLING OR PAIN  UNUSUAL VAGINAL DISCHARGE OR ITCHING   Items with * indicate a potential emergency and should be followed up as soon as possible or go to the Emergency Department if any problems should occur.  Should you have questions after your visit or need to cancel or reschedule your appointment, please contact [facility]  [phone] and follow the prompts.  Office hours are 8:00 a.m. to 4:30 p.m. Monday - Friday. Please note that voicemails left after 4:00 p.m. may not be returned until the following  business day.  We are closed weekends and major holidays. You have access to a nurse at all times for urgent questions. Please call the main number to the clinic [phone] and follow the prompts.  For any non-urgent questions, you may also contact your provider using MyChart. We now offer e-Visits for anyone 18 and older to request care online for non-urgent symptoms. For details visit [email or web address].   Also download the MyChart app! Go to the app store, search "MyChart", open the app, select [facility], and log in with your MyChart username and password.  Due to Covid, a mask is required upon entering the hospital/clinic. If you do not have a mask, one will be given to you upon arrival. For doctor visits, patients may have 1 support person aged 18 or older with them. For treatment visits, patients cannot have anyone with them due to current Covid guidelines and our immunocompromised population.  

## 2021-05-24 ENCOUNTER — Encounter: Payer: Self-pay | Admitting: Gastroenterology

## 2021-05-24 ENCOUNTER — Other Ambulatory Visit: Payer: Self-pay

## 2021-05-24 ENCOUNTER — Ambulatory Visit: Payer: Medicare PPO | Admitting: Gastroenterology

## 2021-05-24 VITALS — BP 207/81 | HR 65 | Temp 97.9°F | Wt 129.0 lb

## 2021-05-24 DIAGNOSIS — R1319 Other dysphagia: Secondary | ICD-10-CM

## 2021-05-24 DIAGNOSIS — D509 Iron deficiency anemia, unspecified: Secondary | ICD-10-CM

## 2021-05-25 NOTE — Progress Notes (Signed)
[name], MD [address]  [place]  [place], [place] [address]  Main: [phone]  Fax: [phone]   Primary Care Physician: Baxter Hire, MD   Chief Complaint  Patient presents with   Follow-up    Son and pt reports she is doing much better   Dysphagia, iron deficiency anemia  HPI: Janet Gross is a 87 y.o. female with history of dementia, presents with her son.  Patient is in good spirits today.  Both son and patient states that she has not had any further dysphagia since her EGD.  She has seen hematology and received Venofer for iron deficiency anemia.  No blood in stool.  No abdominal pain.  No nausea or vomiting.  Patient has had good appetite.  Son reports that they are going back for repeat blood work in 1 to 2 weeks with hematology.  Patient has been on Plavix at home.  Previous history: " Her small bowel capsule study results from [facility] clinic showed a nonbleeding AVM in the past.  However, the report specifically states that the capsule spent a long time in the esophagus, causing significant patient discomfort and therefore, repeating a capsule may not be ideal.  If her anemia improves, we would need to take this into consideration as far as further work-up."  Son stated patient had to be watched for 2 hours after swallowing the capsule as it would not go down and was stuck in the esophagus for that long during the above procedure.  EGD 03/09/2021 showed a moderate Schatzki's ring which was dilated by passing of the scope, gastric erythema, hiatal hernia. Colonoscopy showed 2 subcentimeter polyps that was removed.  Diverticulosis.  Internal hemorrhoids.  Polyps showed tubular adenoma and intramucosal lymphoid aggregates.    ROS: All ROS reviewed and negative except as per HPI   Past Medical History:  Diagnosis Date   Anemia    B12 deficiency 08/03/2016   Dementia ([place])    Diabetes mellitus without complication ([place])    Gastric ulcer     Hypothyroidism    Onychomycosis 02/04/2013   Osteoporosis    Peptic ulcer     Past Surgical History:  Procedure Laterality Date   cataract left     COLONOSCOPY     COLONOSCOPY WITH PROPOFOL N/A 11/13/2016   Procedure: COLONOSCOPY WITH PROPOFOL;  Surgeon: Lollie Sails, MD;  Location: [facility] ENDOSCOPY;  Service: Endoscopy;  Laterality: N/A;   COLONOSCOPY WITH PROPOFOL N/A 03/09/2021   Procedure: COLONOSCOPY WITH PROPOFOL;  Surgeon: Virgel Manifold, MD;  Location: [facility] ENDOSCOPY;  Service: Endoscopy;  Laterality: N/A;  PATIENT HAS DEMENTIA PER SON "REACTS BETTER WHEN I AM PRESENT" PER SON   ESOPHAGOGASTRODUODENOSCOPY     ESOPHAGOGASTRODUODENOSCOPY N/A 03/09/2021   Procedure: ESOPHAGOGASTRODUODENOSCOPY (EGD);  Surgeon: Virgel Manifold, MD;  Location: [facility] ENDOSCOPY;  Service: Endoscopy;  Laterality: N/A;   ESOPHAGOGASTRODUODENOSCOPY (EGD) WITH PROPOFOL N/A 11/13/2016   Procedure: ESOPHAGOGASTRODUODENOSCOPY (EGD) WITH PROPOFOL;  Surgeon: Lollie Sails, MD;  Location: [facility] ENDOSCOPY;  Service: Endoscopy;  Laterality: N/A;   EXCISION VAGINAL CYST     neck artery     carotid endarterectomy   VISCERAL ANGIOGRAPHY N/A 11/15/2020   Procedure: VISCERAL ANGIOGRAPHY;  Surgeon: Katha Cabal, MD;  Location: [place] CV LAB;  Service: Cardiovascular;  Laterality: N/A;    Prior to Admission medications   Medication Sig Start Date End Date Taking? Authorizing Provider  atorvastatin (LIPITOR) 20 MG tablet Take 20 mg by mouth  daily.   Yes [provider]  clopidogrel (PLAVIX) 75 MG tablet Take 1 tablet (75 mg total) by mouth daily. Please hold until cleared by gastroenterology 03/07/21  Yes Lorella Nimrod, MD  donepezil (ARICEPT) 5 MG tablet Take 5 mg by mouth at bedtime. 04/25/19  Yes [provider]  esomeprazole (NEXIUM) 20 MG capsule Take 1 capsule (20 mg total) by mouth 2 (two) times daily before a meal. 03/07/21 03/07/22 Yes Lorella Nimrod, MD  feeding supplement,  GLUCERNA SHAKE, (GLUCERNA SHAKE) LIQD Take 237 mLs by mouth 3 (three) times daily between meals. 03/07/21  Yes Lorella Nimrod, MD  ferrous sulfate 325 (65 FE) MG EC tablet Take 1 tablet (325 mg total) by mouth 2 (two) times daily. 03/07/21 03/07/22 Yes Lorella Nimrod, MD  gabapentin (NEURONTIN) 300 MG capsule Take 300 mg by mouth at bedtime.    Yes [provider]  gabapentin (NEURONTIN) 300 MG capsule Take by mouth. 02/22/21 08/21/21 Yes [provider]  glimepiride (AMARYL) 1 MG tablet Take 1 mg by mouth daily with breakfast. 12/18/18  Yes [provider]  levothyroxine (SYNTHROID) 100 MCG tablet 100 mcg daily before breakfast. 11/17/19  Yes [provider]  losartan (COZAAR) 25 MG tablet Take 25 mg by mouth daily.   Yes [provider]  memantine (NAMENDA) 10 MG tablet Take 10 mg by mouth 2 (two) times daily. 12/19/20  Yes [provider]  metFORMIN (GLUCOPHAGE) 1000 MG tablet Take 500 mg by mouth 2 (two) times daily with a meal.   Yes [provider]  metFORMIN (GLUCOPHAGE) 1000 MG tablet Take 1 tablet by mouth 2 (two) times daily with a meal. 12/08/20  Yes [provider]  Multiple Vitamin (MULTIVITAMIN WITH MINERALS) TABS tablet Take 1 tablet by mouth daily. 03/08/21  Yes Lorella Nimrod, MD  vitamin B-12 (CYANOCOBALAMIN) 1000 MCG tablet Take 1,000 mcg by mouth daily.   Yes [provider]  aspirin EC 81 MG tablet Take 1 tablet (81 mg total) by mouth daily. Hold until cleared by gastroenterology Patient not taking: Reported on 05/24/2021 03/07/21   Lorella Nimrod, MD    Family History  Problem Relation Age of Onset   Heart attack Mother    Emphysema Father    Diabetes Sister    Diabetes Brother      Social History   Tobacco Use   Smoking status: Former    Packs/day: 0.25    Years: 10.00    Pack years: 2.50    Types: Cigarettes    Quit date: 08/07/1963    Years since quitting: 57.8   Smokeless tobacco: Never   Tobacco  comments:    quit over 40 years ago  Vaping Use   Vaping Use: Never used  Substance Use Topics   Alcohol use: No   Drug use: No    Allergies as of 05/24/2021 - Review Complete 05/24/2021  Allergen Reaction Noted   Sulfa antibiotics Other (See Comments) and Hives 04/22/2013    Physical Examination:  Constitutional: General:   Alert,  Well-developed, well-nourished, pleasant and cooperative in NAD BP (!) 207/81   Pulse 65   Temp 97.9 F (36.6 C) (Oral)   Wt 129 lb (58.5 kg)   BMI 20.20 kg/m   Respiratory: Normal respiratory effort  Gastrointestinal:  Soft, non-tender and non-distended without masses, hepatosplenomegaly or hernias noted.  No guarding or rebound tenderness.     Cardiac: No clubbing or edema.  No cyanosis. Normal posterior tibial pedal pulses noted.  Psych:  Alert and cooperative. Normal mood and affect.  Musculoskeletal:  Normal gait. Head normocephalic, atraumatic. Symmetrical without gross deformities. 5/5 Lower extremity strength bilaterally.  Skin: Warm. Intact without significant lesions or rashes. No jaundice.  Neck: Supple, trachea midline  Lymph: No cervical lymphadenopathy  Psych:  Alert and oriented x3, Alert and cooperative. Normal mood and affect.  Labs: CMP     Component Value Date/Time   NA 138 04/20/2021 1015   NA 139 08/19/2013 1919   K 3.6 04/20/2021 1015   K 3.8 08/19/2013 1919   CL 105 04/20/2021 1015   CL 106 08/19/2013 1919   CO2 29 04/20/2021 1015   CO2 29 08/19/2013 1919   GLUCOSE 162 (H) 04/20/2021 1015   GLUCOSE 52 (L) 08/19/2013 1919   BUN 14 04/20/2021 1015   BUN 14 08/19/2013 1919   CREATININE 0.86 04/20/2021 1015   CREATININE 0.81 08/19/2013 1919   CALCIUM 9.0 04/20/2021 1015   CALCIUM 9.3 08/19/2013 1919   PROT 6.9 04/20/2021 1015   ALBUMIN 3.9 04/20/2021 1015   AST 15 04/20/2021 1015   ALT 9 04/20/2021 1015   ALKPHOS 55 04/20/2021 1015   BILITOT 0.9 04/20/2021 1015   GFRNONAA >60 04/20/2021 1015    GFRNONAA >60 08/19/2013 1919   GFRAA >60 07/14/2015 0535   GFRAA >60 08/19/2013 1919   Lab Results  Component Value Date   WBC 4.1 04/20/2021   HGB 10.7 (L) 04/20/2021   HCT 35.0 (L) 04/20/2021   MCV 79.7 (L) 04/20/2021   PLT 229 04/20/2021    Imaging Studies:   Assessment and Plan:   Janet Gross is a 87 y.o. y/o female with history of dementia, presents with her son, and reports resolution of dysphagia since last EGD, improved appetite, and seeing hematology for iron deficiency anemia, with no signs of active bleeding again since hospital discharge  Given that patient had a lot of trouble with her last capsule study and that it was stuck in her esophagus for a long period of time, causing discomfort and having to have the patient monitored during this time, would not recommend repeat capsule study at this time unless anemia does not improve.  Patient recently completed 5 doses of IV Venofer and has repeat hemoglobin to be done by hematology in the next 1 to 2 weeks.  Follow-up in clinic in 4 to 6 weeks to see if her anemia improves versus need for capsule study if it does not  Pt and son do not want to proceed with capsule study at this time either   If dysphagia reoccurs, EGD can be repeated for repeat dilation of Schatzki's ring, and capsule study can be placed in the small bowel at that time.  Dysphagia resolved at this time.  Obtain H. 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  Dr [name]

## 2021-06-28 ENCOUNTER — Other Ambulatory Visit (INDEPENDENT_AMBULATORY_CARE_PROVIDER_SITE_OTHER): Payer: Self-pay | Admitting: Nurse Practitioner

## 2021-06-28 DIAGNOSIS — K551 Chronic vascular disorders of intestine: Secondary | ICD-10-CM

## 2021-07-03 ENCOUNTER — Ambulatory Visit (INDEPENDENT_AMBULATORY_CARE_PROVIDER_SITE_OTHER): Payer: Medicare PPO | Admitting: Vascular Surgery

## 2021-07-03 ENCOUNTER — Ambulatory Visit (INDEPENDENT_AMBULATORY_CARE_PROVIDER_SITE_OTHER): Payer: Medicare PPO

## 2021-07-03 ENCOUNTER — Other Ambulatory Visit: Payer: Self-pay

## 2021-07-03 ENCOUNTER — Encounter (INDEPENDENT_AMBULATORY_CARE_PROVIDER_SITE_OTHER): Payer: Self-pay | Admitting: Vascular Surgery

## 2021-07-03 VITALS — BP 147/69 | HR 56 | Ht 67.0 in | Wt 133.0 lb

## 2021-07-03 DIAGNOSIS — K551 Chronic vascular disorders of intestine: Secondary | ICD-10-CM | POA: Diagnosis not present

## 2021-07-03 DIAGNOSIS — E119 Type 2 diabetes mellitus without complications: Secondary | ICD-10-CM

## 2021-07-03 DIAGNOSIS — E782 Mixed hyperlipidemia: Secondary | ICD-10-CM

## 2021-07-03 DIAGNOSIS — I10 Essential (primary) hypertension: Secondary | ICD-10-CM

## 2021-07-03 NOTE — Progress Notes (Signed)
MRN : [id number]  Janet Gross is a 87 y.o. (Feb 23, 1931) female who presents with chief complaint of check blood flow.  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  The patient denies abdominal pain or postprandial symptoms.  The patient denies weight loss as well as nausea.  The patient does not substantiate food fear, particular foods do not seem to aggravate or alleviate the symptoms.  The patient denies bloody bowel movements or diarrhea.    Past vascular intervention on 11/15/2020:  Stent to the SMA with 6 mm mm diameter x 26 mm length lifestream balloon expandable stent postdilated to 7 mm  The patient denies amaurosis fugax or recent TIA symptoms. There are no recent neurological changes noted. The patient denies claudication symptoms or rest pain symptoms. The patient denies history of DVT, PE or superficial thrombophlebitis. The patient denies recent episodes of angina    Today noninvasive studies show that the proximal SFA stent is patent, previously noted increased velocities are not present.  The celiac hepatic and splenic artery velocities are all normal.  No evidence of an abdominal aortic aneurysm was noted.  Current Meds  Medication Sig   aspirin EC 81 MG tablet Take 1 tablet (81 mg total) by mouth daily. Hold until cleared by gastroenterology   atorvastatin (LIPITOR) 20 MG tablet Take 20 mg by mouth daily.   clopidogrel (PLAVIX) 75 MG tablet Take 1 tablet (75 mg total) by mouth daily. Please hold until cleared by gastroenterology   donepezil (ARICEPT) 5 MG tablet Take 5 mg by mouth at bedtime.   esomeprazole (NEXIUM) 20 MG capsule Take 1 capsule (20 mg total) by mouth 2 (two) times daily before a meal.   feeding supplement, GLUCERNA SHAKE, (GLUCERNA SHAKE) LIQD Take 237 mLs by mouth 3 (three) times daily between meals.   ferrous sulfate  325 (65 FE) MG EC tablet Take 1 tablet (325 mg total) by mouth 2 (two) times daily.   gabapentin (NEURONTIN) 300 MG capsule Take 300 mg by mouth at bedtime.    gabapentin (NEURONTIN) 300 MG capsule Take by mouth.   glimepiride (AMARYL) 1 MG tablet Take 1 mg by mouth daily with breakfast.   levothyroxine (SYNTHROID) 100 MCG tablet 100 mcg daily before breakfast.   losartan (COZAAR) 25 MG tablet Take 25 mg by mouth daily.   memantine (NAMENDA) 10 MG tablet Take 10 mg by mouth 2 (two) times daily.   metFORMIN (GLUCOPHAGE) 1000 MG tablet Take 500 mg by mouth 2 (two) times daily with a meal.   metFORMIN (GLUCOPHAGE) 1000 MG tablet Take 1 tablet by mouth 2 (two) times daily with a meal.   Multiple Vitamin (MULTIVITAMIN WITH MINERALS) TABS tablet Take 1 tablet by mouth daily.   vitamin B-12 (CYANOCOBALAMIN) 1000 MCG tablet Take 1,000 mcg by mouth daily.    Past Medical History:  Diagnosis Date   Anemia    B12 deficiency 08/03/2016   Dementia ([place])    Diabetes mellitus without complication ([place])    Gastric ulcer    Hypothyroidism    Onychomycosis 02/04/2013   Osteoporosis    Peptic ulcer     Past Surgical History:  Procedure Laterality Date   cataract left     COLONOSCOPY     COLONOSCOPY WITH PROPOFOL N/A 11/13/2016   Procedure: COLONOSCOPY WITH PROPOFOL;  Surgeon: Lollie Sails, MD;  Location: [facility] ENDOSCOPY;  Service: Endoscopy;  Laterality: N/A;   COLONOSCOPY WITH PROPOFOL N/A 03/09/2021   Procedure: COLONOSCOPY WITH PROPOFOL;  Surgeon: Virgel Manifold, MD;  Location: [facility] ENDOSCOPY;  Service: Endoscopy;  Laterality: N/A;  PATIENT HAS DEMENTIA PER SON "REACTS BETTER WHEN I AM PRESENT" PER SON   ESOPHAGOGASTRODUODENOSCOPY     ESOPHAGOGASTRODUODENOSCOPY N/A 03/09/2021   Procedure: ESOPHAGOGASTRODUODENOSCOPY (EGD);  Surgeon: Virgel Manifold, MD;  Location: [facility] ENDOSCOPY;  Service: Endoscopy;  Laterality: N/A;   ESOPHAGOGASTRODUODENOSCOPY (EGD) WITH PROPOFOL N/A 11/13/2016    Procedure: ESOPHAGOGASTRODUODENOSCOPY (EGD) WITH PROPOFOL;  Surgeon: Lollie Sails, MD;  Location: [facility] ENDOSCOPY;  Service: Endoscopy;  Laterality: N/A;   EXCISION VAGINAL CYST     neck artery     carotid endarterectomy   VISCERAL ANGIOGRAPHY N/A 11/15/2020   Procedure: VISCERAL ANGIOGRAPHY;  Surgeon: Katha Cabal, MD;  Location: [place] CV LAB;  Service: Cardiovascular;  Laterality: N/A;    Social History Social History   Tobacco Use   Smoking status: Former    Packs/day: 0.25    Years: 10.00    Pack years: 2.50    Types: Cigarettes    Quit date: 08/07/1963    Years since quitting: 57.9   Smokeless tobacco: Never   Tobacco comments:    quit over 40 years ago  Vaping Use   Vaping Use: Never used  Substance Use Topics   Alcohol use: No   Drug use: No    Family History Family History  Problem Relation Age of Onset   Heart attack Mother    Emphysema Father    Diabetes Sister    Diabetes Brother     Allergies  Allergen Reactions   Sulfa Antibiotics Other (See Comments) and Hives    INCREASES HER BLOOD SUGAR , PT STATED  INCREASES HER BLOOD SUGAR , PT STATED  INCREASES HER BLOOD SUGAR , PT STATED  INCREASES HER BLOOD SUGAR , PT STATED  INCREASES HER BLOOD SUGAR , PT STATED  INCREASES HER BLOOD SUGAR , PT STATED      REVIEW OF SYSTEMS (Negative unless checked)  Constitutional: [] Weight loss  [] Fever  [] Chills Cardiac: [] Chest pain   [] Chest pressure   [] Palpitations   [] Shortness of breath when laying flat   [] Shortness of breath with exertion. Vascular:  [] Pain in legs with walking   [] Pain in legs at rest  [] History of DVT   [] Phlebitis   [] Swelling in legs   [] Varicose veins   [] Non-healing ulcers Pulmonary:   [] Uses home oxygen   [] Productive cough   [] Hemoptysis   [] Wheeze  [] COPD   [] Asthma Neurologic:  [] Dizziness   [] Seizures   [] History of stroke   [] History of TIA  [] Aphasia   [] Vissual changes   [] Weakness or numbness in arm   [] Weakness or  numbness in leg Musculoskeletal:   [] Joint swelling   [] Joint pain   [] Low back pain Hematologic:  [] Easy bruising  [] Easy bleeding   [] Hypercoagulable state   [] Anemic Gastrointestinal:  [] Diarrhea   [] Vomiting  [] Gastroesophageal reflux/heartburn   [] Difficulty swallowing. Genitourinary:  [] Chronic kidney disease   [] Difficult urination  [] Frequent urination   [] Blood in urine Skin:  [] Rashes   [] Ulcers  Psychological:  [] History of anxiety   []  History of major depression.  Physical Examination  Vitals:   07/03/21 0855  BP: (!) 147/69  Pulse: (!) 56  Weight: 133 lb (60.3 kg)  Height: 5\' 7"  (1.702 m)   Body mass index is 20.83 kg/m. Gen: WD/WN, NAD  Head: [place]/AT, No temporalis wasting.  Ear/Nose/Throat: Hearing grossly intact, nares w/o erythema or drainage Eyes: PER, EOMI, sclera nonicteric.  Neck: Supple, no masses.  No bruit or JVD.  Pulmonary:  Good air movement, no audible wheezing, no use of accessory muscles.  Cardiac: RRR, normal S1, S2, no Murmurs. Vascular:  no abdominal bruit noted Vessel Right Left  Radial Palpable Palpable  Gastrointestinal: soft, non-distended. No guarding/no peritoneal signs.  Musculoskeletal: M/S 5/5 throughout.  No visible deformity.  Neurologic: CN 2-12 intact. Pain and light touch intact in extremities.  Symmetrical.  Speech is fluent. Motor exam as listed above. Psychiatric: Judgment intact, Mood & affect appropriate for pt's clinical situation. Dermatologic: No rashes or ulcers noted.  No changes consistent with cellulitis.   CBC Lab Results  Component Value Date   WBC 4.1 04/20/2021   HGB 10.7 (L) 04/20/2021   HCT 35.0 (L) 04/20/2021   MCV 79.7 (L) 04/20/2021   PLT 229 04/20/2021    BMET    Component Value Date/Time   NA 138 04/20/2021 1015   NA 139 08/19/2013 1919   K 3.6 04/20/2021 1015   K 3.8 08/19/2013 1919   CL 105 04/20/2021 1015   CL 106 08/19/2013 1919   CO2 29 04/20/2021 1015   CO2 29 08/19/2013 1919   GLUCOSE  162 (H) 04/20/2021 1015   GLUCOSE 52 (L) 08/19/2013 1919   BUN 14 04/20/2021 1015   BUN 14 08/19/2013 1919   CREATININE 0.86 04/20/2021 1015   CREATININE 0.81 08/19/2013 1919   CALCIUM 9.0 04/20/2021 1015   CALCIUM 9.3 08/19/2013 1919   GFRNONAA >60 04/20/2021 1015   GFRNONAA >60 08/19/2013 1919   GFRAA >60 07/14/2015 0535   GFRAA >60 08/19/2013 1919   CrCl cannot be calculated (Patient's most recent lab result is older than the maximum 21 days allowed.).  COAG No results found for: INR, PROTIME  Radiology No results found.   Assessment/Plan 1. Chronic mesenteric ischemia (HCC) Recommend:  The patient has evidence of chronic asymptomatic mesenteric atherosclerosis s/p successful intervention.  The patient denies lifestyle limiting changes at this point in time.  Given the lack of symptoms no intervention is warranted at this time.   No further invasive studies, angiography or surgery at this time  The patient should continue walking and begin a more formal exercise program.   The patient should continue antiplatelet therapy and aggressive treatment of the lipid abnormalities  Patient should undergo noninvasive studies as ordered. The patient will follow up with me after the studies.    - VAS [place] MESENTERIC; Future  2. Controlled type 2 diabetes mellitus without complication, unspecified whether long term insulin use ([place]) Continue hypoglycemic medications as already ordered, these medications have been reviewed and there are no changes at this time.  Hgb A1C to be monitored as already arranged by primary service   3. Mixed hyperlipidemia Continue statin as ordered and reviewed, no changes at this time   4. Essential hypertension Continue antihypertensive medications as already ordered, these medications have been reviewed and there are no changes at this time.     Hortencia Pilar, MD  07/03/2021 9:04 AM

## 2021-07-04 ENCOUNTER — Encounter (INDEPENDENT_AMBULATORY_CARE_PROVIDER_SITE_OTHER): Payer: Self-pay | Admitting: Vascular Surgery

## 2021-07-05 ENCOUNTER — Inpatient Hospital Stay: Payer: Medicare PPO | Attending: Nurse Practitioner

## 2021-07-10 ENCOUNTER — Other Ambulatory Visit: Payer: Self-pay

## 2021-07-10 ENCOUNTER — Telehealth: Payer: Medicare PPO | Admitting: Gastroenterology

## 2021-07-10 ENCOUNTER — Telehealth: Payer: Self-pay

## 2021-07-10 NOTE — Telephone Encounter (Signed)
I have called pt 3 times to reach her to see if we could still proceed with telephone visit or if I could reschedule

## 2021-07-11 ENCOUNTER — Inpatient Hospital Stay: Payer: Medicare PPO | Attending: Oncology

## 2021-07-11 ENCOUNTER — Other Ambulatory Visit: Payer: Self-pay

## 2021-07-11 DIAGNOSIS — E538 Deficiency of other specified B group vitamins: Secondary | ICD-10-CM | POA: Diagnosis not present

## 2021-07-11 DIAGNOSIS — D72819 Decreased white blood cell count, unspecified: Secondary | ICD-10-CM | POA: Diagnosis not present

## 2021-07-11 DIAGNOSIS — D509 Iron deficiency anemia, unspecified: Secondary | ICD-10-CM | POA: Diagnosis not present

## 2021-07-11 DIAGNOSIS — Z7901 Long term (current) use of anticoagulants: Secondary | ICD-10-CM | POA: Insufficient documentation

## 2021-07-11 LAB — CBC WITH DIFFERENTIAL/PLATELET
Abs Immature Granulocytes: 0 10*3/uL (ref 0.00–0.07)
Basophils Absolute: 0 10*3/uL (ref 0.0–0.1)
Basophils Relative: 1 %
Eosinophils Absolute: 0 10*3/uL (ref 0.0–0.5)
Eosinophils Relative: 1 %
HCT: 37.7 % (ref 36.0–46.0)
Hemoglobin: 11.8 g/dL — ABNORMAL LOW (ref 12.0–15.0)
Immature Granulocytes: 0 %
Lymphocytes Relative: 31 %
Lymphs Abs: 1.1 10*3/uL (ref 0.7–4.0)
MCH: 26.5 pg (ref 26.0–34.0)
MCHC: 31.3 g/dL (ref 30.0–36.0)
MCV: 84.5 fL (ref 80.0–100.0)
Monocytes Absolute: 0.4 10*3/uL (ref 0.1–1.0)
Monocytes Relative: 11 %
Neutro Abs: 2 10*3/uL (ref 1.7–7.7)
Neutrophils Relative %: 56 %
Platelets: 235 10*3/uL (ref 150–400)
RBC: 4.46 MIL/uL (ref 3.87–5.11)
RDW: 16.1 % — ABNORMAL HIGH (ref 11.5–15.5)
WBC: 3.5 10*3/uL — ABNORMAL LOW (ref 4.0–10.5)
nRBC: 0 % (ref 0.0–0.2)

## 2021-07-11 LAB — IRON AND TIBC
Iron: 58 ug/dL (ref 28–170)
Saturation Ratios: 20 % (ref 10.4–31.8)
TIBC: 291 ug/dL (ref 250–450)
UIBC: 233 ug/dL

## 2021-07-11 LAB — COMPREHENSIVE METABOLIC PANEL
ALT: 9 U/L (ref 0–44)
AST: 15 U/L (ref 15–41)
Albumin: 3.9 g/dL (ref 3.5–5.0)
Alkaline Phosphatase: 74 U/L (ref 38–126)
Anion gap: 10 (ref 5–15)
BUN: 13 mg/dL (ref 8–23)
CO2: 26 mmol/L (ref 22–32)
Calcium: 9.2 mg/dL (ref 8.9–10.3)
Chloride: 101 mmol/L (ref 98–111)
Creatinine, Ser: 0.98 mg/dL (ref 0.44–1.00)
GFR, Estimated: 56 mL/min — ABNORMAL LOW (ref >60)
Glucose, Bld: 269 mg/dL — ABNORMAL HIGH (ref 70–99)
Potassium: 3.9 mmol/L (ref 3.5–5.1)
Sodium: 137 mmol/L (ref 135–145)
Total Bilirubin: 0.4 mg/dL (ref 0.3–1.2)
Total Protein: 6.9 g/dL (ref 6.5–8.1)

## 2021-07-11 LAB — FOLATE: Folate: 7.7 ng/mL (ref >5.9)

## 2021-07-11 LAB — FERRITIN: Ferritin: 106 ng/mL (ref 11–307)

## 2021-07-11 LAB — VITAMIN B12: Vitamin B-12: 580 pg/mL (ref 180–914)

## 2021-07-12 ENCOUNTER — Inpatient Hospital Stay: Payer: Medicare PPO

## 2021-07-12 ENCOUNTER — Inpatient Hospital Stay: Payer: Medicare PPO | Admitting: Oncology

## 2021-07-18 ENCOUNTER — Other Ambulatory Visit: Payer: Medicare PPO

## 2021-07-18 ENCOUNTER — Inpatient Hospital Stay: Payer: Medicare PPO

## 2021-07-18 ENCOUNTER — Inpatient Hospital Stay: Payer: Medicare PPO | Admitting: Nurse Practitioner

## 2021-07-19 ENCOUNTER — Ambulatory Visit: Payer: Medicare PPO | Admitting: Nurse Practitioner

## 2021-07-19 ENCOUNTER — Ambulatory Visit: Payer: Medicare PPO

## 2021-08-10 ENCOUNTER — Other Ambulatory Visit: Payer: Self-pay

## 2021-10-10 ENCOUNTER — Other Ambulatory Visit: Payer: Self-pay | Admitting: *Deleted

## 2021-10-10 DIAGNOSIS — D500 Iron deficiency anemia secondary to blood loss (chronic): Secondary | ICD-10-CM

## 2021-10-17 ENCOUNTER — Inpatient Hospital Stay: Payer: Medicare PPO

## 2021-10-17 ENCOUNTER — Inpatient Hospital Stay: Payer: Medicare PPO | Admitting: Oncology

## 2021-10-20 ENCOUNTER — Other Ambulatory Visit (INDEPENDENT_AMBULATORY_CARE_PROVIDER_SITE_OTHER): Payer: Self-pay | Admitting: Vascular Surgery

## 2022-07-04 ENCOUNTER — Other Ambulatory Visit (INDEPENDENT_AMBULATORY_CARE_PROVIDER_SITE_OTHER): Payer: Self-pay | Admitting: Vascular Surgery

## 2022-07-04 DIAGNOSIS — K551 Chronic vascular disorders of intestine: Secondary | ICD-10-CM

## 2022-07-05 ENCOUNTER — Encounter (INDEPENDENT_AMBULATORY_CARE_PROVIDER_SITE_OTHER): Payer: Self-pay

## 2022-07-05 ENCOUNTER — Ambulatory Visit (INDEPENDENT_AMBULATORY_CARE_PROVIDER_SITE_OTHER): Payer: Medicare PPO | Admitting: Vascular Surgery

## 2022-07-05 ENCOUNTER — Encounter (INDEPENDENT_AMBULATORY_CARE_PROVIDER_SITE_OTHER): Payer: Medicare PPO

## 2022-07-13 ENCOUNTER — Ambulatory Visit (LOCAL_COMMUNITY_HEALTH_CENTER): Payer: Medicare PPO

## 2022-07-13 ENCOUNTER — Encounter: Payer: Self-pay | Admitting: Oncology

## 2022-07-13 DIAGNOSIS — Z23 Encounter for immunization: Secondary | ICD-10-CM

## 2022-07-13 DIAGNOSIS — Z7185 Encounter for immunization safety counseling: Secondary | ICD-10-CM

## 2022-07-13 NOTE — Progress Notes (Signed)
  Are you feeling sick today? No   Have you ever received a dose of COVID-19 Vaccine? [employer], [place], [place], [place], Other) Yes  If yes, which vaccine and how many doses? Pfizer and 5 doses     Did you bring the vaccination record card or other documentation?  No   Do you have a health condition or are undergoing treatment that makes you moderately or severely immunocompromised? This would include, but not be limited to: cancer, HIV, organ transplant, immunosuppressive therapy/high-dose corticosteroids, or moderate/severe primary immunodeficiency.  No  Have you received COVID-19 vaccine before or during hematopoietic cell transplant (HCT) or CAR-T-cell therapies? No  Have you ever had an allergic reaction to: (This would include a severe allergic reaction or a reaction that caused hives, swelling, or respiratory distress, including wheezing.) A component of a COVID-19 vaccine or a previous dose of COVID-19 vaccine? No   Have you ever had an allergic reaction to another vaccine (other thanCOVID-19 vaccine) or an injectable medication? (This would include a severe allergic reaction or a reaction that caused hives, swelling, or respiratory distress, including wheezing.)   No    Do you have a history of any of the following:  Myocarditis or Pericarditis No  Dermal fillers:  No  Multisystem Inflammatory Syndrome (MIS-C or MIS-A)? No  COVID-19 disease within the past 3 months? No  Vaccinated with monkeypox vaccine in the last 4 weeks? No  Pfizer Comirnaty [phone] (12 yrs+) given and tolerated well. Updated NCIR copy given. Stayed for 15 min observation without problem. Josie Saunders, RN

## 2022-07-25 ENCOUNTER — Encounter: Payer: Self-pay | Admitting: Oncology

## 2022-08-01 ENCOUNTER — Encounter: Payer: Self-pay | Admitting: Emergency Medicine

## 2022-08-01 ENCOUNTER — Emergency Department
Admission: EM | Admit: 2022-08-01 | Discharge: 2022-08-01 | Disposition: A | Discharge status: Home / Self Care | Payer: Medicare PPO | Attending: Emergency Medicine | Admitting: Emergency Medicine

## 2022-08-01 DIAGNOSIS — F0390 Unspecified dementia without behavioral disturbance: Secondary | ICD-10-CM | POA: Insufficient documentation

## 2022-08-01 DIAGNOSIS — I10 Essential (primary) hypertension: Secondary | ICD-10-CM | POA: Diagnosis present

## 2022-08-01 NOTE — Discharge Instructions (Signed)
Please seek medical attention for any high fevers, chest pain, shortness of breath, change in behavior, persistent vomiting, bloody stool or any other new or concerning symptoms.  
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
## 2022-08-26 ENCOUNTER — Other Ambulatory Visit: Payer: Self-pay

## 2022-08-26 ENCOUNTER — Emergency Department
Admission: EM | Admit: 2022-08-26 | Discharge: 2022-08-26 | Disposition: A | Discharge status: Home / Self Care | Payer: Medicare PPO | Attending: Emergency Medicine | Admitting: Emergency Medicine

## 2022-08-26 ENCOUNTER — Emergency Department: Payer: Medicare PPO

## 2022-08-26 DIAGNOSIS — W01190A Fall on same level from slipping, tripping and stumbling with subsequent striking against furniture, initial encounter: Secondary | ICD-10-CM | POA: Insufficient documentation

## 2022-08-26 DIAGNOSIS — Z79899 Other long term (current) drug therapy: Secondary | ICD-10-CM | POA: Insufficient documentation

## 2022-08-26 DIAGNOSIS — Z7982 Long term (current) use of aspirin: Secondary | ICD-10-CM | POA: Insufficient documentation

## 2022-08-26 DIAGNOSIS — I10 Essential (primary) hypertension: Secondary | ICD-10-CM | POA: Insufficient documentation

## 2022-08-26 DIAGNOSIS — E119 Type 2 diabetes mellitus without complications: Secondary | ICD-10-CM | POA: Diagnosis not present

## 2022-08-26 DIAGNOSIS — Z87891 Personal history of nicotine dependence: Secondary | ICD-10-CM | POA: Insufficient documentation

## 2022-08-26 DIAGNOSIS — W19XXXA Unspecified fall, initial encounter: Secondary | ICD-10-CM

## 2022-08-26 DIAGNOSIS — S0093XA Contusion of unspecified part of head, initial encounter: Secondary | ICD-10-CM | POA: Diagnosis not present

## 2022-08-26 DIAGNOSIS — Z7984 Long term (current) use of oral hypoglycemic drugs: Secondary | ICD-10-CM | POA: Diagnosis not present

## 2022-08-26 DIAGNOSIS — S0990XA Unspecified injury of head, initial encounter: Secondary | ICD-10-CM | POA: Diagnosis present

## 2022-08-26 DIAGNOSIS — E039 Hypothyroidism, unspecified: Secondary | ICD-10-CM | POA: Insufficient documentation

## 2022-08-26 DIAGNOSIS — F0390 Unspecified dementia without behavioral disturbance: Secondary | ICD-10-CM | POA: Insufficient documentation

## 2022-08-26 NOTE — ED Provider Notes (Signed)
[facility] Emergency Department Provider Note   ____________________________________________   Event Date/Time   First MD Initiated Contact with Patient 08/26/22 1518     (approximate)  I have reviewed the triage vital signs and the nursing notes.   HISTORY  Chief Complaint Fall   HPI Janet Gross is a 89 y.o. female presents with son who also assists as historian for this visit.  Patient does have dementia at baseline. Son reports that he went to play something in recycling bin outside.  When he returned, he found his mother in the floor with the chair turned however.  He reports that she was lying on her side with left side of head up against the wall.  He reports that she was complaining of her head hurting.  However, he did not notice any other injuries.  Patient was able to stand and walk immediately.  Her orientation is at baseline.  Patient currently denies headache, and she is only complaining of a soreness when the left temple is touched.  There is no bruising/abrasion/laceration noted to the forehead or temple.   Past Medical History:  Diagnosis Date   Anemia    B12 deficiency 08/03/2016   Dementia ([place])    Diabetes mellitus without complication ([place])    Gastric ulcer    Hypothyroidism    Onychomycosis 02/04/2013   Osteoporosis    Peptic ulcer     Patient Active Problem List   Diagnosis Date Noted   Esophageal dysphagia    Schatzki's ring    Hiatal hernia    Gastric erythema    Polyp of transverse colon    Malnutrition of moderate degree 03/07/2021   Symptomatic anemia 03/04/2021   Slurred speech 03/04/2021   Chronic mesenteric ischemia ([place]) 11/07/2020   Essential hypertension 03/17/2019   Pain due to onychomycosis of toenails of both feet 01/29/2019   Diabetic neuropathy ([place]) 01/29/2019   Late onset Alzheimer's disease without behavioral disturbance ([place]) 12/31/2017   B12 deficiency 08/03/2016   Iron deficiency anemia  06/27/2016   Hypoglycemia 07/13/2015   Controlled type 2 diabetes mellitus without complication ([place]) 02/63/7858   Gastric ulcer 02/22/2014   HLD (hyperlipidemia) 02/22/2014   Adult hypothyroidism 02/22/2014   Adiposity 02/22/2014   Osteoporosis, 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   Onychomycosis     Past Surgical History:  Procedure Laterality Date   cataract left     COLONOSCOPY     COLONOSCOPY WITH PROPOFOL N/A 11/13/2016   Procedure: COLONOSCOPY WITH PROPOFOL;  Surgeon: Lollie Sails, MD;  Location: [facility] ENDOSCOPY;  Service: Endoscopy;  Laterality: N/A;   COLONOSCOPY WITH PROPOFOL N/A 03/09/2021   Procedure: COLONOSCOPY WITH PROPOFOL;  Surgeon: Virgel Manifold, MD;  Location: [facility] ENDOSCOPY;  Service: Endoscopy;  Laterality: N/A;  PATIENT HAS DEMENTIA PER SON "REACTS BETTER WHEN I AM PRESENT" PER SON   ESOPHAGOGASTRODUODENOSCOPY     ESOPHAGOGASTRODUODENOSCOPY N/A 03/09/2021   Procedure: ESOPHAGOGASTRODUODENOSCOPY (EGD);  Surgeon: Virgel Manifold, MD;  Location: [facility] ENDOSCOPY;  Service: Endoscopy;  Laterality: N/A;   ESOPHAGOGASTRODUODENOSCOPY (EGD) WITH PROPOFOL N/A 11/13/2016   Procedure: ESOPHAGOGASTRODUODENOSCOPY (EGD) WITH PROPOFOL;  Surgeon: Lollie Sails, MD;  Location: [facility] ENDOSCOPY;  Service: Endoscopy;  Laterality: N/A;   EXCISION VAGINAL CYST     neck artery     carotid endarterectomy   VISCERAL ANGIOGRAPHY N/A 11/15/2020   Procedure: VISCERAL ANGIOGRAPHY;  Surgeon: Katha Cabal, MD;  Location: [place] CV LAB;  Service: Cardiovascular;  Laterality: N/A;    Prior  to Admission medications   Medication Sig Start Date End Date Taking? Authorizing Provider  aspirin EC 81 MG tablet Take 1 tablet (81 mg total) by mouth daily. Hold until cleared by gastroenterology 03/07/21   Lorella Nimrod, MD  atorvastatin (LIPITOR) 20 MG tablet Take 20 mg by mouth daily.    [provider]  clopidogrel (PLAVIX) 75 MG tablet TAKE 1 TABLET BY MOUTH EVERY DAY 10/23/21    Schnier, Dolores Lory, MD  donepezil (ARICEPT) 5 MG tablet Take 5 mg by mouth at bedtime. 04/25/19   [provider]  esomeprazole (NEXIUM) 20 MG capsule Take 1 capsule (20 mg total) by mouth 2 (two) times daily before a meal. 03/07/21 03/07/22  Lorella Nimrod, MD  feeding supplement, GLUCERNA SHAKE, (GLUCERNA SHAKE) LIQD Take 237 mLs by mouth 3 (three) times daily between meals. 03/07/21   Lorella Nimrod, MD  ferrous sulfate 325 (65 FE) MG EC tablet Take 1 tablet (325 mg total) by mouth 2 (two) times daily. 03/07/21 03/07/22  Lorella Nimrod, MD  gabapentin (NEURONTIN) 300 MG capsule Take 300 mg by mouth at bedtime.     [provider]  gabapentin (NEURONTIN) 300 MG capsule Take by mouth. 02/22/21 08/21/21  [provider]  glimepiride (AMARYL) 1 MG tablet Take 1 mg by mouth daily with breakfast. 12/18/18   [provider]  levothyroxine (SYNTHROID) 100 MCG tablet 100 mcg daily before breakfast. 11/17/19   [provider]  losartan (COZAAR) 25 MG tablet Take 25 mg by mouth daily.    [provider]  memantine (NAMENDA) 10 MG tablet Take 10 mg by mouth 2 (two) times daily. 12/19/20   [provider]  metFORMIN (GLUCOPHAGE) 1000 MG tablet Take 500 mg by mouth 2 (two) times daily with a meal.    [provider]  metFORMIN (GLUCOPHAGE) 1000 MG tablet Take 1 tablet by mouth 2 (two) times daily with a meal. 12/08/20   [provider]  Multiple Vitamin (MULTIVITAMIN WITH MINERALS) TABS tablet Take 1 tablet by mouth daily. 03/08/21   Lorella Nimrod, MD  vitamin B-12 (CYANOCOBALAMIN) 1000 MCG tablet Take 1,000 mcg by mouth daily.    [provider]    Allergies Sulfa antibiotics  Family History  Problem Relation Age of Onset   Heart attack Mother    Emphysema Father    Diabetes Sister    Diabetes Brother     Social History Social History   Tobacco Use   Smoking status: Former    Packs/day: 0.25    Years: 10.00    Total pack  years: 2.50    Types: Cigarettes    Quit date: 08/07/1963    Years since quitting: 59.0   Smokeless tobacco: Never   Tobacco comments:    quit over 40 years ago  Vaping Use   Vaping Use: Never used  Substance Use Topics   Alcohol use: No   Drug use: No    Review of Systems  Constitutional: No fever/chills Eyes: No visual changes. ENT: No sore throat. Cardiovascular: Denies chest pain. Respiratory: Denies shortness of breath. Gastrointestinal: No abdominal pain.  No nausea, no vomiting.  No diarrhea.  No constipation. Genitourinary: Negative for dysuria. Musculoskeletal: Negative for back pain. Skin: Negative for rash. Neurological: Negative for headaches, focal weakness or numbness.  Patient does complain of "soreness" to the left temple.   ____________________________________________   PHYSICAL EXAM:  VITAL SIGNS: ED Triage Vitals  Enc Vitals Group     BP 08/26/22 1505 Marland Kitchen)  141/81     Pulse Rate 08/26/22 1505 66     Resp 08/26/22 1505 16     Temp 08/26/22 1505 98.3 F (36.8 C)     Temp Source 08/26/22 1505 Oral     SpO2 08/26/22 1505 99 %     Weight 08/26/22 1500 141 lb (64 kg)     Height 08/26/22 1500 '5\' 7"'$  (1.702 m)     Head Circumference --      Peak Flow --      Pain Score --      Pain Loc --      Pain Edu? --      Excl. in [place]? --     Constitutional: Patient has dementia but is oriented at her baseline.  Well appearing and in no acute distress. Eyes: Conjunctivae are normal. PERRL. EOMI. Head: Patient is noted to have marble sized knot to left forehead/temple region. Nose: No congestion/rhinnorhea. Mouth/Throat: Mucous membranes are moist.  Oropharynx non-erythematous. Neck: No stridor.  No spinal tenderness noted on palpation.  C-spine abnormality noted on visualization. Cardiovascular: Normal rate, regular rhythm. Grossly normal heart sounds.  Good peripheral circulation. Respiratory: Normal respiratory effort.  No retractions. Lungs  CTAB. Gastrointestinal: Soft and nontender. No distention. No abdominal bruits. No CVA tenderness. Musculoskeletal: No lower extremity tenderness nor edema.  No joint effusions.  Patient has full range of motion to bilateral hips. Neurologic:  Normal speech and language. No gross focal neurologic deficits are appreciated. No gait instability. Skin:  Skin is warm, dry and intact. No rash noted. Psychiatric: Mood and affect are normal. Speech and behavior are normal.  ____________________________________________   LABS (all labs ordered are listed, but only abnormal results are displayed)  Labs Reviewed - No data to display ____________________________________________  EKG   ____________________________________________  RADIOLOGY  ED MD interpretation: CT of head and C-spine reviewed by me and read by radiologist.  Official radiology report(s): CT Head Wo Contrast  Result Date: 08/26/2022 CLINICAL DATA:  Trauma EXAM: CT HEAD WITHOUT CONTRAST CT CERVICAL SPINE WITHOUT CONTRAST TECHNIQUE: Multidetector CT imaging of the head and cervical spine was performed following the standard protocol without intravenous contrast. Multiplanar CT image reconstructions of the cervical spine were also generated. RADIATION DOSE REDUCTION: This exam was performed according to the departmental dose-optimization program which includes automated exposure control, adjustment of the mA and/or kV according to patient size and/or use of iterative reconstruction technique. COMPARISON:  MRI brain 03/05/2021 FINDINGS: CT HEAD FINDINGS Brain: No evidence of acute infarction, hemorrhage, hydrocephalus, extra-axial collection or mass lesion/mass effect. Vascular: Atherosclerotic calcifications are present within the cavernous internal carotid arteries. Skull: Normal. Negative for fracture or focal lesion. Sinuses/Orbits: No acute finding. Other: None. CT CERVICAL SPINE FINDINGS Alignment: Normal. Skull base and vertebrae:  No acute fracture. No primary bone lesion or focal pathologic process. Soft tissues and spinal canal: No prevertebral fluid or swelling. No visible canal hematoma. Disc levels: No significant central canal or neural foraminal stenosis at any level. Upper chest: Negative. Other: There are postsurgical changes in the region of the right carotid artery. IMPRESSION: No acute intracranial process. No acute fracture or traumatic subluxation of the cervical spine. Electronically Signed   By: Ronney Asters M.D.   On: 08/26/2022 17:08   CT Cervical Spine Wo Contrast  Result Date: 08/26/2022 CLINICAL DATA:  Trauma EXAM: CT HEAD WITHOUT CONTRAST CT CERVICAL SPINE WITHOUT CONTRAST TECHNIQUE: Multidetector CT imaging of the head and cervical spine was performed following the standard protocol  without intravenous contrast. Multiplanar CT image reconstructions of the cervical spine were also generated. RADIATION DOSE REDUCTION: This exam was performed according to the departmental dose-optimization program which includes automated exposure control, adjustment of the mA and/or kV according to patient size and/or use of iterative reconstruction technique. COMPARISON:  MRI brain 03/05/2021 FINDINGS: CT HEAD FINDINGS Brain: No evidence of acute infarction, hemorrhage, hydrocephalus, extra-axial collection or mass lesion/mass effect. Vascular: Atherosclerotic calcifications are present within the cavernous internal carotid arteries. Skull: Normal. Negative for fracture or focal lesion. Sinuses/Orbits: No acute finding. Other: None. CT CERVICAL SPINE FINDINGS Alignment: Normal. Skull base and vertebrae: No acute fracture. No primary bone lesion or focal pathologic process. Soft tissues and spinal canal: No prevertebral fluid or swelling. No visible canal hematoma. Disc levels: No significant central canal or neural foraminal stenosis at any level. Upper chest: Negative. Other: There are postsurgical changes in the region of the  right carotid artery. IMPRESSION: No acute intracranial process. No acute fracture or traumatic subluxation of the cervical spine. Electronically Signed   By: Ronney Asters M.D.   On: 08/26/2022 17:08    ____________________________________________   PROCEDURES  Procedure(s) performed: None  Procedures  Critical Care performed: No  ____________________________________________   INITIAL IMPRESSION / ASSESSMENT AND PLAN / ED COURSE     Janet Gross is a 89 y.o. female presents with son who also assists as historian for this visit.  Patient does have dementia at baseline. Son reports that he went to play something in recycling bin outside.  When he returned, he found his mother in the floor with the chair turned however.  He reports that she was lying on her side with left side of head up against the wall.  He reports that she was complaining of her head hurting.  However, he did not notice any other injuries.  Patient was able to stand and walk immediately.  Her orientation is at baseline.  Patient currently denies headache, and she is only complaining of a soreness when the left temple is touched.  There is no bruising/abrasion/laceration noted to the forehead or temple.  Will obtain CT of head as well as C-spine. CT of head and C-spine were reviewed and both show no acute fracture or intracranial pressure.  Patient remains alert and oriented to her baseline. She continues to deny any vision changes or headaches. She is able to ambulate unassisted.  Patient will be discharged in stable condition at this time with her son.      ____________________________________________   FINAL CLINICAL IMPRESSION(S) / ED DIAGNOSES  Final diagnoses:  Fall, initial encounter  Contusion of head, unspecified part of head, initial encounter     ED Discharge Orders     None        Note:  This document was prepared using Dragon voice recognition software and may include  unintentional dictation errors.     Willaim Rayas, NP 08/26/22 1715    Lavonia Drafts, MD 08/26/22 Kathyrn Drown
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
## 2022-08-26 NOTE — Discharge Instructions (Signed)
Been seen today in the emergency room after having a fall.  Your CT scan of your head and neck were both normal.  You will be discharged home in stable condition to follow-up with your primary care provider as needed.

## 2022-08-31 ENCOUNTER — Encounter (HOSPITAL_COMMUNITY): Payer: Self-pay

## 2022-08-31 ENCOUNTER — Emergency Department (HOSPITAL_COMMUNITY)
Admission: EM | Admit: 2022-08-31 | Discharge: 2022-08-31 | Disposition: A | Discharge status: Home / Self Care | Payer: Medicare PPO | Attending: Emergency Medicine | Admitting: Emergency Medicine

## 2022-08-31 ENCOUNTER — Other Ambulatory Visit: Payer: Self-pay

## 2022-08-31 ENCOUNTER — Emergency Department (HOSPITAL_COMMUNITY): Payer: Medicare PPO

## 2022-08-31 DIAGNOSIS — Z23 Encounter for immunization: Secondary | ICD-10-CM | POA: Insufficient documentation

## 2022-08-31 DIAGNOSIS — S51812A Laceration without foreign body of left forearm, initial encounter: Secondary | ICD-10-CM | POA: Insufficient documentation

## 2022-08-31 DIAGNOSIS — R0789 Other chest pain: Secondary | ICD-10-CM | POA: Diagnosis not present

## 2022-08-31 DIAGNOSIS — F0390 Unspecified dementia without behavioral disturbance: Secondary | ICD-10-CM | POA: Diagnosis not present

## 2022-08-31 DIAGNOSIS — Y92410 Unspecified street and highway as the place of occurrence of the external cause: Secondary | ICD-10-CM | POA: Insufficient documentation

## 2022-08-31 DIAGNOSIS — Z7982 Long term (current) use of aspirin: Secondary | ICD-10-CM | POA: Insufficient documentation

## 2022-08-31 DIAGNOSIS — S59912A Unspecified injury of left forearm, initial encounter: Secondary | ICD-10-CM | POA: Diagnosis present

## 2022-08-31 DIAGNOSIS — S0990XA Unspecified injury of head, initial encounter: Secondary | ICD-10-CM | POA: Diagnosis not present

## 2022-08-31 LAB — CBC
HCT: 38.2 % (ref 36.0–46.0)
Hemoglobin: 12.3 g/dL (ref 12.0–15.0)
MCH: 27 pg (ref 26.0–34.0)
MCHC: 32.2 g/dL (ref 30.0–36.0)
MCV: 84 fL (ref 80.0–100.0)
Platelets: 204 10*3/uL (ref 150–400)
RBC: 4.55 MIL/uL (ref 3.87–5.11)
RDW: 14.5 % (ref 11.5–15.5)
WBC: 7.1 10*3/uL (ref 4.0–10.5)
nRBC: 0 % (ref 0.0–0.2)

## 2022-08-31 LAB — COMPREHENSIVE METABOLIC PANEL
ALT: 20 U/L (ref 0–44)
AST: 31 U/L (ref 15–41)
Albumin: 3.5 g/dL (ref 3.5–5.0)
Alkaline Phosphatase: 54 U/L (ref 38–126)
Anion gap: 8 (ref 5–15)
BUN: 11 mg/dL (ref 8–23)
CO2: 26 mmol/L (ref 22–32)
Calcium: 9 mg/dL (ref 8.9–10.3)
Chloride: 104 mmol/L (ref 98–111)
Creatinine, Ser: 0.92 mg/dL (ref 0.44–1.00)
GFR, Estimated: 60 mL/min — ABNORMAL LOW (ref >60)
Glucose, Bld: 86 mg/dL (ref 70–99)
Potassium: 3.7 mmol/L (ref 3.5–5.1)
Sodium: 138 mmol/L (ref 135–145)
Total Bilirubin: 0.6 mg/dL (ref 0.3–1.2)
Total Protein: 6.4 g/dL — ABNORMAL LOW (ref 6.5–8.1)

## 2022-08-31 LAB — TROPONIN I (HIGH SENSITIVITY): Troponin I (High Sensitivity): 7 ng/L (ref <18)

## 2022-08-31 MED ORDER — LIDOCAINE 5 % EX PTCH: 1.0000 | MEDICATED_PATCH | CUTANEOUS | 0 refills | Status: AC

## 2022-08-31 MED ORDER — ACETAMINOPHEN 500 MG PO TABS
1000.0000 mg | ORAL_TABLET | ORAL | Status: AC
  Administered 2022-08-31: 1000 mg via ORAL
  Filled 2022-08-31: qty 2

## 2022-08-31 MED ORDER — LIDOCAINE 5 % EX PTCH
1.0000 | MEDICATED_PATCH | CUTANEOUS | Status: DC
  Administered 2022-08-31: 1 via TRANSDERMAL
  Filled 2022-08-31: qty 1

## 2022-08-31 MED ORDER — TETANUS-DIPHTH-ACELL PERTUSSIS 5-2.5-18.5 LF-MCG/0.5 IM SUSY
0.5000 mL | PREFILLED_SYRINGE | Freq: Once | INTRAMUSCULAR | Status: AC
  Administered 2022-08-31: 0.5 mL via INTRAMUSCULAR
  Filled 2022-08-31: qty 0.5
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
## 2022-08-31 NOTE — ED Notes (Signed)
Provider in to see patient
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
## 2022-08-31 NOTE — ED Notes (Signed)
Pt return from [place]

## 2022-08-31 NOTE — ED Notes (Signed)
Pt to CT scanner at this time 

## 2022-08-31 NOTE — ED Notes (Signed)
Pt ambulated to room with triage tech and family.  Pt ambulatory without difficulty.  Dementia at baseline.

## 2022-10-27 ENCOUNTER — Emergency Department
Admission: EM | Admit: 2022-10-27 | Discharge: 2022-10-27 | Disposition: A | Discharge status: Home / Self Care | Payer: Medicare PPO | Attending: Emergency Medicine | Admitting: Emergency Medicine

## 2022-10-27 ENCOUNTER — Other Ambulatory Visit: Payer: Self-pay

## 2022-10-27 DIAGNOSIS — I10 Essential (primary) hypertension: Secondary | ICD-10-CM | POA: Diagnosis not present

## 2022-10-27 DIAGNOSIS — S20221A Contusion of right back wall of thorax, initial encounter: Secondary | ICD-10-CM | POA: Diagnosis not present

## 2022-10-27 DIAGNOSIS — W010XXA Fall on same level from slipping, tripping and stumbling without subsequent striking against object, initial encounter: Secondary | ICD-10-CM | POA: Diagnosis not present

## 2022-10-27 DIAGNOSIS — F0390 Unspecified dementia without behavioral disturbance: Secondary | ICD-10-CM | POA: Insufficient documentation

## 2022-10-27 DIAGNOSIS — W19XXXA Unspecified fall, initial encounter: Secondary | ICD-10-CM

## 2022-10-27 DIAGNOSIS — S3992XA Unspecified injury of lower back, initial encounter: Secondary | ICD-10-CM | POA: Diagnosis present

## 2022-10-27 NOTE — ED Provider Notes (Signed)
   [facility] Provider Note    Event Date/Time   First MD Initiated Contact with Patient 10/27/22 1239     (approximate)   History   Fall   HPI  Janet Gross is a 89 y.o. female with a history of dementia, hypertension who presents after a fall.  Patient lost her balance and fell back and bumped her right side into a dresser.  Son is here with her.  Patient reports mild discomfort in her right mid back.  No pain with ambulation.  Has not had her blood pressure medication yet today     Physical Exam   Triage Vital Signs: ED Triage Vitals [10/27/22 1218]  Enc Vitals Group     BP (!) 205/84     Pulse Rate (!) 48     Resp 18     Temp 98.7 F (37.1 C)     Temp Source Oral     SpO2 100 %     Weight      Height      Head Circumference      Peak Flow      Pain Score      Pain Loc      Pain Edu?      Excl. in [place]?     Most recent vital signs: Vitals:   10/27/22 1218  BP: (!) 205/84  Pulse: (!) 48  Resp: 18  Temp: 98.7 F (37.1 C)  SpO2: 100%     General: Awake, no distress.  CV:  Good peripheral perfusion.  Resp:  Normal effort.  Abd:  No distention.  Other:  Back: No vertebral tenderness to palpation, small bruise to the right mid paraspinal area, no significant tenderness to palpation   ED Results / Procedures / Treatments   Labs (all labs ordered are listed, but only abnormal results are displayed) Labs Reviewed - No data to display   EKG     RADIOLOGY     PROCEDURES:  Critical Care performed:   Procedures   MEDICATIONS ORDERED IN ED: Medications - No data to display   IMPRESSION / MDM / [place] / ED COURSE  I reviewed the triage vital signs and the nursing notes. Patient's presentation is most consistent with acute, uncomplicated illness.  Patient presents after fall as above, quite reassuring exam, patient is ambulating well, sitting up quite comfortably.  No vertebral tenderness palpation,  not consistent with fracture, suspect mild contusion, recommend supportive care.        FINAL CLINICAL IMPRESSION(S) / ED DIAGNOSES   Final diagnoses:  Fall, initial encounter  Back contusion, right, initial encounter     Rx / DC Orders   ED Discharge Orders     None        Note:  This document was prepared using Dragon voice recognition software and may include unintentional dictation errors.   Lavonia Drafts, MD 10/27/22 [phone]

## 2022-10-27 NOTE — ED Triage Notes (Signed)
Son states pt fell this morning. She fell backwards and hit her back on the dresser. Pt c/o right back pain.

## 2023-06-24 IMAGING — MR MR HEAD W/O CM
13 series · 43 of 48 positions shown · non-contrast
Comparison: CT from 03/04/2021 and CTA from 11/03/2008. Major
intracranial vascular flow voids are otherwise maintained.

CLINICAL DATA: Initial evaluation for acute TIA.

EXAM:
MRI HEAD WITHOUT CONTRAST
TECHNIQUE: Multiplanar, multiecho pulse sequences of the brain and surrounding
structures were obtained without intravenous contrast.

[Series 5: ax dwi_tracew · axial · 3.0mm · 0.65mm/px · z∈[-140,+6]mm · 4 of 46 slices shown]
[im 1/46]
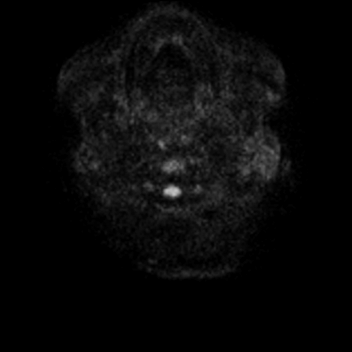
[im 16/46]
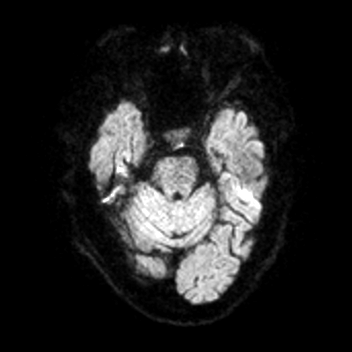
[im 31/46]
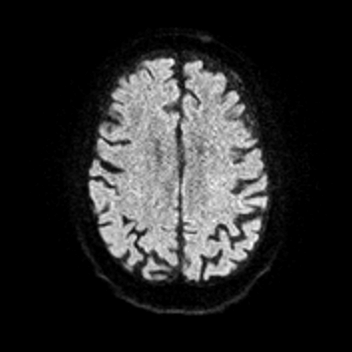
[im 46/46]
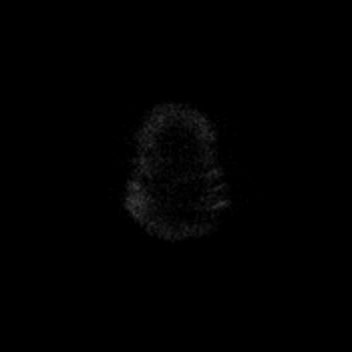

[Series 6: ax dwi_adc · axial · 3.0mm · 0.65mm/px · z∈[-140,+6]mm · 4 of 46 slices shown]
[im 1/46]
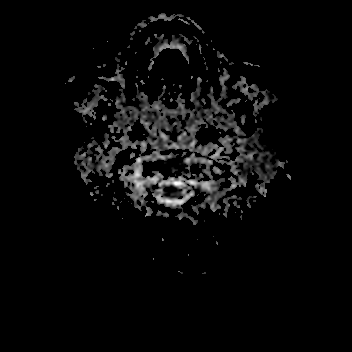
[im 16/46]
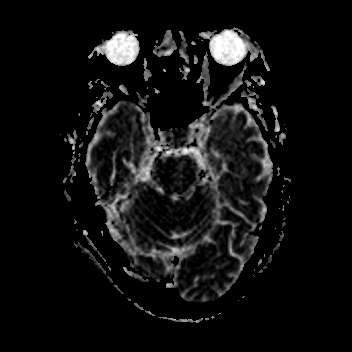
[im 31/46]
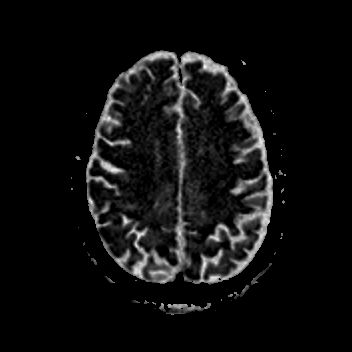
[im 46/46]
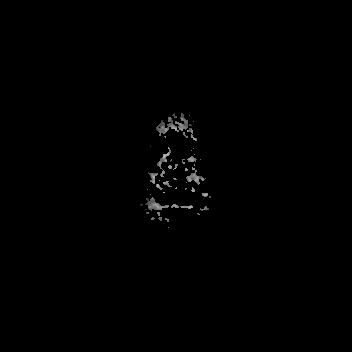

[Series 7: cor dwi_tracew · coronal · 5.0mm · 0.60mm/px · 3 of 36 slices shown]
[im 1/36]
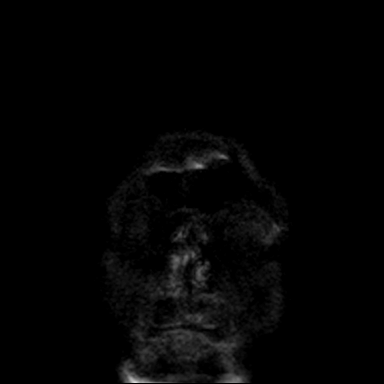
[im 18/36]
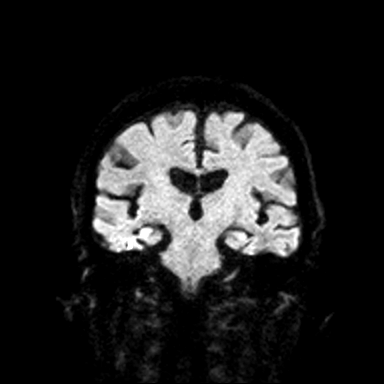
[im 36/36]
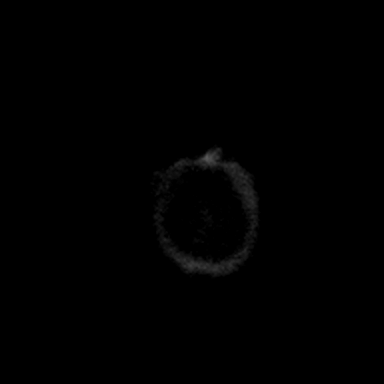

[Series 8: cor dwi_adc · coronal · 5.0mm · 0.60mm/px · 3 of 36 slices shown]
[im 1/36]
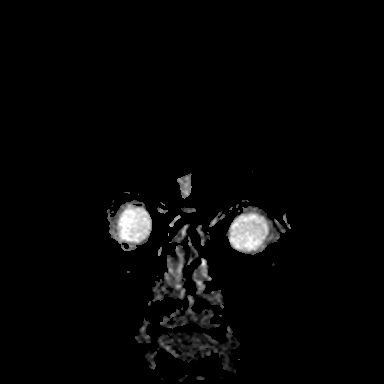
[im 18/36]
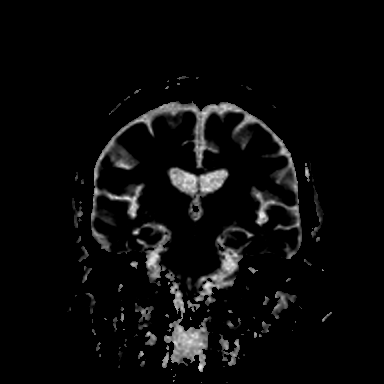
[im 36/36]
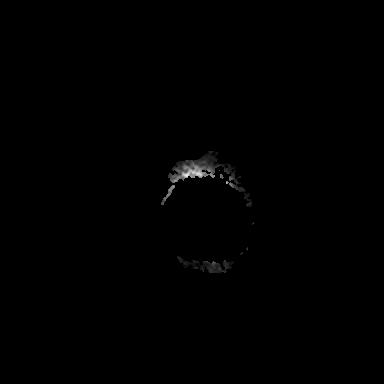

[Series 13: T1 · sagittal · 5.0mm · 0.62mm/px · 1 of 20 slices shown (1 of 2)]
[im 1/20]
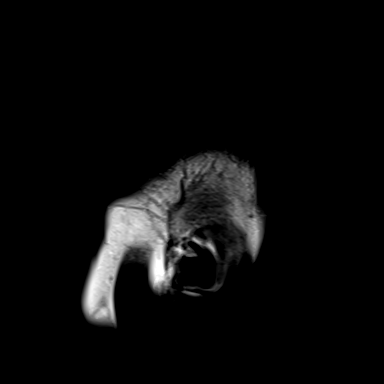

[Series 14: T2 · axial · 5.0mm · 0.53mm/px · z∈[-138,-8]mm · 2 of 23 slices shown (1 of 2)]
[im 1/23]
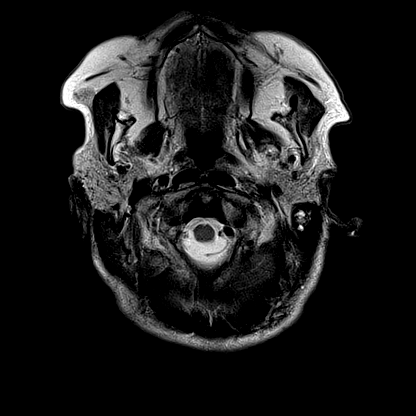
[im 23/23]
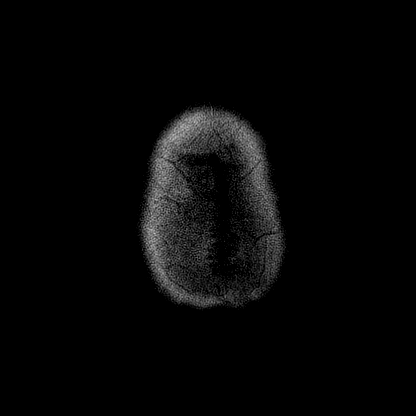

[Series 15: mag_images · axial · 3.0mm · 0.90mm/px · 1 of 16 slices shown]
[im 1/16]
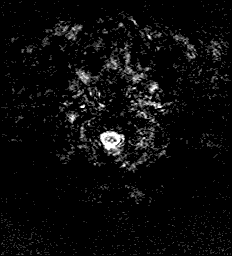

[Series 16: pha_images · axial · 3.0mm · 0.90mm/px · z∈[-160,+14]mm · 4 of 58 slices shown]
[im 1/58]
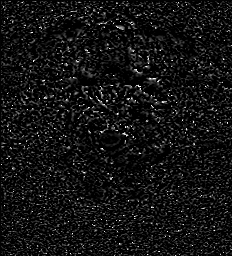
[im 20/58]
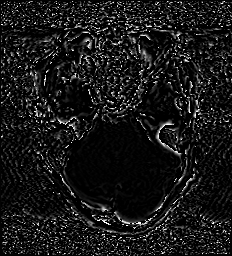
[im 39/58]
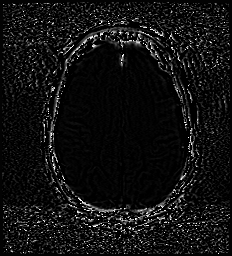
[im 58/58]
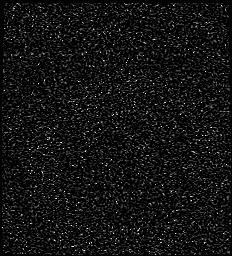

[Series 17: swi_images_nonorm · axial · 3.0mm · 0.90mm/px · z∈[-160,+14]mm · 4 of 60 slices shown]
[im 1/60]
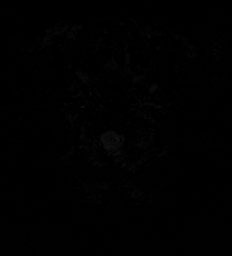
[im 20/60]
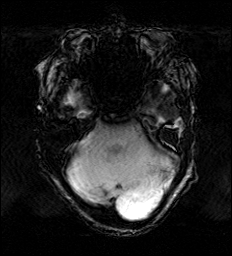
[im 40/60]
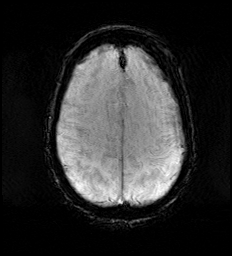
[im 60/60]
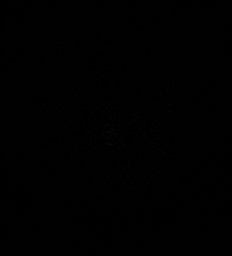

[Series 18: mag_images_nonorm · axial · 3.0mm · 0.90mm/px · z∈[-113,+14]mm · 3 of 44 slices shown]
[im 1/44]
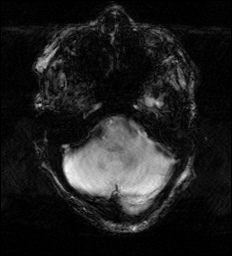
[im 22/44]
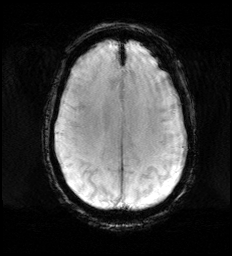
[im 44/44]
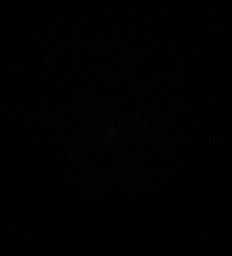

[Series 20: FLAIR · axial · 3.0mm · 0.53mm/px · z∈[-152,+7]mm · 4 of 55 slices shown]
[im 1/55]
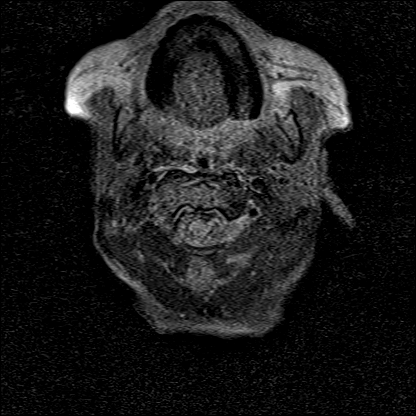
[im 19/55]
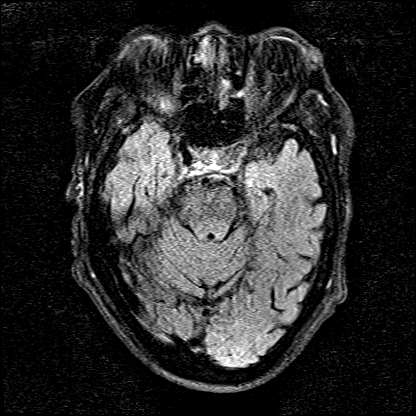
[im 37/55]
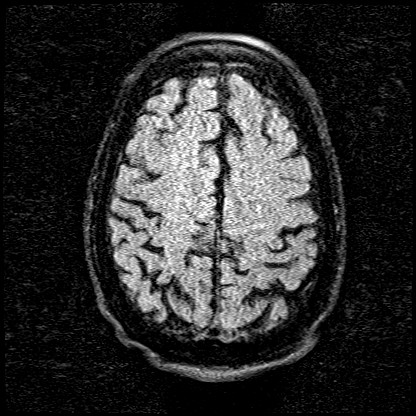
[im 55/55]
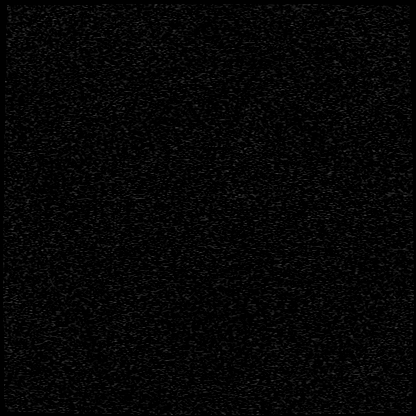

[Series 21: T1 · axial · 1.0mm · 0.98mm/px · z∈[-161,+11]mm · 8 of 176 slices shown (2 of 2)]
[im 1/176]
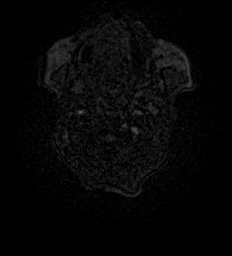
[im 30/176]
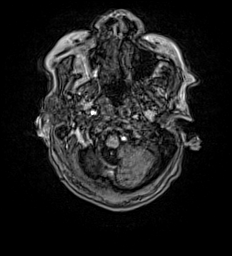
[im 59/176]
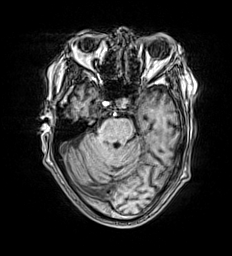
[im 73/176]
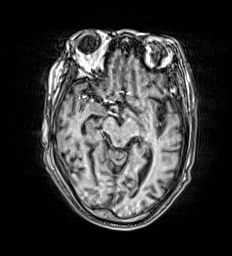
[im 103/176]
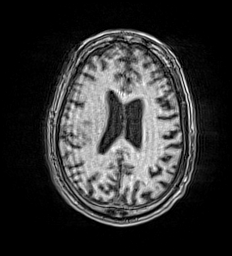
[im 117/176]
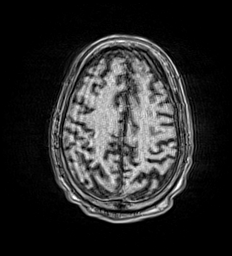
[im 146/176]
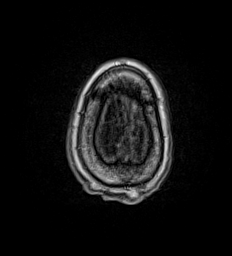
[im 176/176]
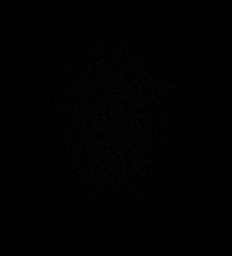

[Series 22: T2 · coronal · 5.0mm · 0.45mm/px · 2 of 29 slices shown (2 of 2)]
[im 1/29]
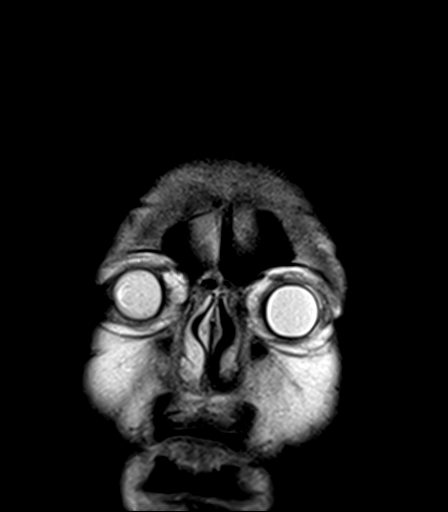
[im 29/29]
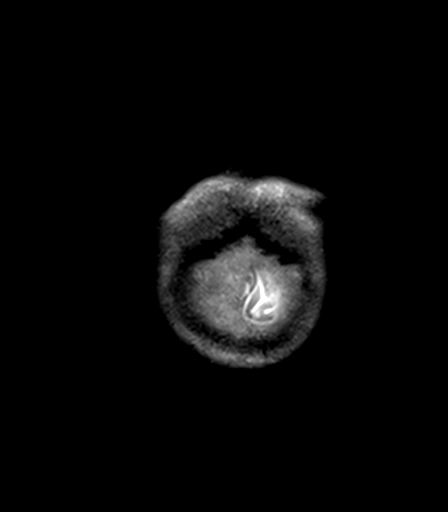

[43 of 48 positions shown; findings below may reference images not displayed]

FINDINGS: Brain: Examination moderately degraded by motion artifact.

Generalized age-related cerebral atrophy. Relatively mild chronic
microvascular ischemic disease noted involving the periventricular
deep white matter both cerebral hemispheres. No abnormal foci of
restricted diffusion to suggest acute or subacute ischemia.
Gray-white matter differentiation maintained. No encephalomalacia to
suggest chronic cortical infarction. No foci of susceptibility
artifact to suggest acute or chronic intracranial hemorrhage.

No mass lesion, midline shift or mass effect. Mild ventricular
prominence related to global parenchymal volume loss without
hydrocephalus. No extra-axial fluid collection. Pituitary gland and
suprasellar region within normal limits. Midline structures intact.

Vascular: Abnormal flow void within the left ICA to the siphon,
consistent with chronic left ICA occlusion

Skull and upper cervical spine: Craniocervical junction within
normal limits. Bone marrow signal intensity normal. No scalp soft
tissue abnormality.

Sinuses/Orbits: Prior bilateral ocular lens replacement. Paranasal
sinuses are largely clear. Small left with trace right mastoid
effusions, of doubtful significance. Inner ear structures grossly
normal. Visualized nasopharynx unremarkable.

Other: None.
IMPRESSION: 1. No acute intracranial abnormality.
2. Generalized age-related cerebral atrophy with mild chronic small
vessel ischemic disease.
3. Chronic left ICA occlusion.

## 2024-02-17 ENCOUNTER — Other Ambulatory Visit: Payer: Self-pay

## 2024-02-17 ENCOUNTER — Emergency Department
Admission: EM | Admit: 2024-02-17 | Discharge: 2024-02-17 | Disposition: A | Discharge status: Home / Self Care | Attending: Emergency Medicine | Admitting: Emergency Medicine

## 2024-02-17 ENCOUNTER — Emergency Department

## 2024-02-17 DIAGNOSIS — S62609A Fracture of unspecified phalanx of unspecified finger, initial encounter for closed fracture: Secondary | ICD-10-CM

## 2024-02-17 DIAGNOSIS — W1839XA Other fall on same level, initial encounter: Secondary | ICD-10-CM | POA: Insufficient documentation

## 2024-02-17 DIAGNOSIS — S62615A Displaced fracture of proximal phalanx of left ring finger, initial encounter for closed fracture: Secondary | ICD-10-CM | POA: Diagnosis not present

## 2024-02-17 DIAGNOSIS — S62617A Displaced fracture of proximal phalanx of left little finger, initial encounter for closed fracture: Secondary | ICD-10-CM | POA: Insufficient documentation

## 2024-02-17 DIAGNOSIS — S62611A Displaced fracture of proximal phalanx of left index finger, initial encounter for closed fracture: Secondary | ICD-10-CM | POA: Insufficient documentation

## 2024-02-17 DIAGNOSIS — E119 Type 2 diabetes mellitus without complications: Secondary | ICD-10-CM | POA: Insufficient documentation

## 2024-02-17 DIAGNOSIS — S62613A Displaced fracture of proximal phalanx of left middle finger, initial encounter for closed fracture: Secondary | ICD-10-CM | POA: Diagnosis not present

## 2024-02-17 DIAGNOSIS — E039 Hypothyroidism, unspecified: Secondary | ICD-10-CM | POA: Diagnosis not present

## 2024-02-17 DIAGNOSIS — S61412A Laceration without foreign body of left hand, initial encounter: Secondary | ICD-10-CM | POA: Diagnosis not present

## 2024-02-17 DIAGNOSIS — M79622 Pain in left upper arm: Secondary | ICD-10-CM | POA: Insufficient documentation

## 2024-02-17 DIAGNOSIS — F0390 Unspecified dementia without behavioral disturbance: Secondary | ICD-10-CM | POA: Diagnosis not present

## 2024-02-17 DIAGNOSIS — S6992XA Unspecified injury of left wrist, hand and finger(s), initial encounter: Secondary | ICD-10-CM | POA: Diagnosis present

## 2024-02-17 MED ORDER — LIDOCAINE HCL (PF) 1 % IJ SOLN
10.0000 mL | Freq: Once | INTRAMUSCULAR | Status: AC
  Administered 2024-02-17: 10 mL via INTRADERMAL
  Filled 2024-02-17: qty 10

## 2024-02-17 MED ORDER — LIDOCAINE HCL (PF) 1 % IJ SOLN
5.0000 mL | Freq: Once | INTRAMUSCULAR | Status: DC
Start: 2024-02-17 — End: 2024-02-17

## 2024-02-17 MED ORDER — OXYCODONE-ACETAMINOPHEN 5-325 MG PO TABS
1.0000 | ORAL_TABLET | Freq: Once | ORAL | Status: AC
  Administered 2024-02-17: 1 via ORAL
  Filled 2024-02-17: qty 1

## 2024-02-17 MED ORDER — OXYCODONE-ACETAMINOPHEN 5-325 MG PO TABS: 1.0000 | ORAL_TABLET | ORAL | 0 refills | Status: AC | PRN

## 2024-02-17 MED ORDER — CEPHALEXIN 500 MG PO CAPS: 500.0000 mg | ORAL_CAPSULE | Freq: Three times a day (TID) | ORAL | 0 refills | Status: AC

## 2024-02-17 MED ORDER — LIDOCAINE HCL (PF) 1 % IJ SOLN
5.0000 mL | Freq: Once | INTRAMUSCULAR | Status: AC
  Administered 2024-02-17: 5 mL
  Filled 2024-02-17: qty 5

## 2024-02-17 NOTE — Discharge Instructions (Signed)
 You have fractures of the left hand along the left index finger, middle finger, ring finger and pinky finger. Follow-up with the hand specialist to the laceration noted below the fractures. Do not get the splint wet.  You can cover it in a plastic bag when you need to bathe Take the Keflex  as prescribed Percocet for pain only if needed.  Be careful as this may make you drowsy and you may fall

## 2024-02-17 NOTE — ED Triage Notes (Signed)
 Pt to ED from home with mechanical fall at dr appointment that resulted in injury to left hand. Witnesses to the fall state that she did not hit head/no LOC. Pt is demented at baseline and family reports she is having trouble walking as dementia progresses. A&O x2.

## 2024-02-17 NOTE — ED Provider Notes (Addendum)
 [facility] Provider Note    Event Date/Time   First MD Initiated Contact with Patient 02/17/24 1041     (approximate)   History   Fall   HPI  Janet Gross is a 89 y.o. female history of diabetes, osteoporosis, hypothyroidism, peptic ulcer, dementia presents emergency department after a fall.  Patient fell in the left hand.  Complaining of left hand pain and a laceration to the palm of the hand.  Also complaining of left upper arm pain on the fall.  No head injury or headache.  No numbness tingling.  No strokelike symptoms.  Family is present and is conveying most of the information.      Physical Exam   Triage Vital Signs: ED Triage Vitals  Encounter Vitals Group     BP 02/17/24 1033 (!) 180/100     Girls Systolic BP Percentile --      Girls Diastolic BP Percentile --      Boys Systolic BP Percentile --      Boys Diastolic BP Percentile --      Pulse Rate 02/17/24 1033 63     Resp 02/17/24 1033 18     Temp 02/17/24 1033 97.8 F (36.6 C)     Temp Source 02/17/24 1033 Oral     SpO2 02/17/24 1033 100 %     Weight 02/17/24 1044 141 lb 1.5 oz (64 kg)     Height 02/17/24 1044 5' 7.5 (1.715 m)     Head Circumference --      Peak Flow --      Pain Score --      Pain Loc --      Pain Education --      Exclude from Growth Chart --     Most recent vital signs: Vitals:   02/17/24 1033  BP: (!) 180/100  Pulse: 63  Resp: 18  Temp: 97.8 F (36.6 C)  SpO2: 100%     General: Awake, no distress.   CV:  Good peripheral perfusion. Resp:  Normal effort.  Abd:  No distention.   Other:  Left hand with deformities noted at the proximal phalanx of 2nd, 3rd, 4th and 5th fingers, laceration noted on the palm of the left hand approximately 1 cm across the distal metacarpal, left humerus tender to palpation, C-spine is nontender, skull is nontender.   ED Results / Procedures / Treatments   Labs (all labs ordered are listed, but only abnormal  results are displayed) Labs Reviewed - No data to display   EKG     RADIOLOGY CT head, x-ray left humerus, x-ray left hand    PROCEDURES:   .Laceration Repair  Date/Time: 02/17/2024 1:22 PM  Performed by: Janet Gross ORN, PA-C Authorized by: Janet Gross ORN, PA-C   Consent:    Consent obtained:  Verbal   Consent given by:  Patient   Risks, benefits, and alternatives were discussed: yes     Risks discussed:  Infection, pain, retained foreign body, tendon damage, poor cosmetic result, need for additional repair, nerve damage, poor wound healing and vascular damage   Alternatives discussed:  No treatment Universal protocol:    Procedure explained and questions answered to patient or proxy's satisfaction: yes     Immediately prior to procedure, a time out was called: yes     Patient identity confirmed:  Verbally with patient Anesthesia:    Anesthesia method:  Local infiltration   Local anesthetic:  Lidocaine  1% w/o epi  Laceration details:    Location:  Hand   Hand location:  L palm   Length (cm):  8 Pre-procedure details:    Preparation:  Patient was prepped and draped in usual sterile fashion and imaging obtained to evaluate for foreign bodies Exploration:    Limited defect created (wound extended): no     Hemostasis achieved with:  Direct pressure   Imaging obtained: x-ray     Imaging outcome: foreign body not noted     Wound exploration: wound explored through full range of motion and entire depth of wound visualized     Wound extent: underlying fracture     Wound extent: areolar tissue not violated, fascia not violated, no foreign body, no signs of injury, no nerve damage, no tendon damage and no vascular damage     Contaminated: no   Treatment:    Area cleansed with:  Povidone-iodine  and saline   Amount of cleaning:  Extensive   Irrigation solution:  Sterile saline   Irrigation method:  Syringe and tap   Debridement:  None   Undermining:  None   Scar  revision: no   Skin repair:    Repair method:  Sutures   Suture size:  5-0   Suture material:  Nylon   Suture technique:  Simple interrupted   Number of sutures:  14 Approximation:    Approximation:  Close Repair type:    Repair type:  Simple Post-procedure details:    Dressing:  Non-adherent dressing and splint for protection   Procedure completion:  Tolerated well, no immediate complications   Critical Care:  no Chief Complaint  Patient presents with   Fall      MEDICATIONS ORDERED IN ED: Medications  oxyCODONE -acetaminophen  (PERCOCET/ROXICET) 5-325 MG per tablet 1 tablet (1 tablet Oral Given 02/17/24 1118)  lidocaine  (PF) (XYLOCAINE ) 1 % injection 10 mL (10 mLs Intradermal Given by Other 02/17/24 1242)  lidocaine  (PF) (XYLOCAINE ) 1 % injection 5 mL (5 mLs Other Given by Other 02/17/24 1243)     IMPRESSION / MDM / ASSESSMENT AND PLAN / ED COURSE  I reviewed the triage vital signs and the nursing notes.                              Differential diagnosis includes, but is not limited to, subdural, SAH, fall, fracture, contusion, laceration  Patient's presentation is most consistent with acute illness / injury with system symptoms.    Medications given: Percocet 1 p.o.  X-ray of the left hand was independently reviewed interpreted by me as having fractures at the base of the 2nd, 3rd, 4th and 5th fingers.  No fracture of the wrist, pending radiology read  On physical exam patient did have left upper humerus pain on palpation, so we will go ahead and get x-ray of the left humerus.    X-ray left humerus independently reviewed interpreted by me as being negative for any acute abnormality  See procedure note for laceration repair  Due to the laceration being just proximal to the fractures we will go ahead and place her on Keflex  3 times daily.  Dressing and volar splint applied. I did give the patient just a few Percocet.  Did caution the family that this could cause her  to get drowsy and fall again.  Be very careful with this medication and only use it at bedtime when she will be still.  Or when someone else is in the  home to help her get around.  They are in agreement with this treatment plan.  Patient was discharged stable condition.      FINAL CLINICAL IMPRESSION(S) / ED DIAGNOSES   Final diagnoses:  Fracture of phalanx of multiple fingers  Laceration of left palm, initial encounter     Rx / DC Orders   ED Discharge Orders          Ordered    cephALEXin  (KEFLEX ) 500 MG capsule  3 times daily        02/17/24 1318    oxyCODONE -acetaminophen  (PERCOCET) 5-325 MG tablet  Every 4 hours PRN        02/17/24 1318             Note:  This document was prepared using Dragon voice recognition software and may include unintentional dictation errors.    Janet Gross ORN, PA-C 02/17/24 1322    Janet Gross ORN, PA-C 02/17/24 1324    Arlander Charleston, MD 02/17/24 [phone]

## 2024-02-17 NOTE — ED Notes (Signed)
 Patient transported to X-ray

## 2024-09-11 ENCOUNTER — Emergency Department

## 2024-09-11 ENCOUNTER — Encounter: Payer: Self-pay | Admitting: Emergency Medicine

## 2024-09-11 ENCOUNTER — Emergency Department
Admission: EM | Admit: 2024-09-11 | Discharge: 2024-09-11 | Disposition: A | Discharge status: Home / Self Care | Source: Home / Self Care | Attending: Emergency Medicine | Admitting: Emergency Medicine

## 2024-09-11 DIAGNOSIS — W19XXXA Unspecified fall, initial encounter: Secondary | ICD-10-CM

## 2024-09-11 DIAGNOSIS — S42402A Unspecified fracture of lower end of left humerus, initial encounter for closed fracture: Secondary | ICD-10-CM

## 2024-09-11 MED ORDER — ACETAMINOPHEN 500 MG PO TABS
1000.0000 mg | ORAL_TABLET | Freq: Once | ORAL | Status: AC
  Administered 2024-09-11: 1000 mg via ORAL
  Filled 2024-09-11: qty 2

## 2024-09-11 NOTE — ED Provider Notes (Cosign Needed)
 "   [facility] Emergency Department Provider Note     Event Date/Time   First MD Initiated Contact with Patient 09/11/24 1134     (approximate)   History   Arm Injury   HPI  Janet Gross is a 89 y.o. female with a past medical history of diabetes, dementia, osteoporosis and anemia presents to the ED for evaluation of left arm pain after a mechanical fall.  Patient is accompanied by her friend who witnessed fall.  The friend who is supporting history reports patient tripped and fell onto her left elbow.  The patient is complaining of left elbow pain.  She reports radiation up and down her entire arm.  Sensation remains intact.  She denies head injury or LOC.  Patient remains ambulatory.  No other complaint.     Physical Exam   Triage Vital Signs: ED Triage Vitals  Encounter Vitals Group     BP 09/11/24 1109 (!) 102/55     Girls Systolic BP Percentile --      Girls Diastolic BP Percentile --      Boys Systolic BP Percentile --      Boys Diastolic BP Percentile --      Pulse Rate 09/11/24 1109 62     Resp 09/11/24 1109 17     Temp 09/11/24 1106 98.3 F (36.8 C)     Temp Source 09/11/24 1106 Oral     SpO2 09/11/24 1109 99 %     Weight --      Height --      Head Circumference --      Peak Flow --      Pain Score --      Pain Loc --      Pain Education --      Exclude from Growth Chart --     Most recent vital signs: Vitals:   09/11/24 1106 09/11/24 1109  BP:  (!) 102/55  Pulse:  62  Resp:  17  Temp: 98.3 F (36.8 C) 98.3 F (36.8 C)  SpO2:  99%    General Awake, no distress.  HEENT NCAT. PERRL. EOMI. No rhinorrhea. Mucous membranes are moist.  CV:  Good peripheral perfusion.  RESP:  Normal effort.  ABD:  No distention.  Other:  No visible deformity to left arm.  There is moderate swelling to the left elbow.  No color changes.  Patient is able to actively flex and extend at elbow joint without difficulty.  Radial pulses palpated  2+.  Good capillary refills.   ED Results / Procedures / Treatments   Labs (all labs ordered are listed, but only abnormal results are displayed) Labs Reviewed - No data to display RADIOLOGY  I personally viewed and evaluated these images as part of my medical decision making, as well as reviewing the written report by the radiologist.  DG Shoulder Left Result Date: 09/11/2024 EXAM: 1 VIEW(S) XRAY OF THE LEFT SHOULDER 09/11/2024 11:56:00 AM COMPARISON: None available. CLINICAL HISTORY: Fall. FINDINGS: BONES AND JOINTS: Glenohumeral joint is normally aligned with mild degenerative changes. No acute fracture. No malalignment. Moderate AC joint osteoarthritis. Osteopenia. Degenerative changes of the visualized spine. SOFT TISSUES: Surgical clips noted along the right lower neck. No abnormal calcifications. Visualized lung is unremarkable. VASCULATURE: Aortic atherosclerosis. IMPRESSION: 1. Osteopenia. No acute fracture or dislocation. Electronically signed by: Rogelia Myers MD 09/11/2024 01:05 PM EST RP Workstation: [id number]   DG Wrist Complete Left Result Date: 09/11/2024 EXAM: 3 VIEW(S) XRAY  OF THE LEFT WRIST 09/11/2024 11:56:00 AM COMPARISON: None available. CLINICAL HISTORY: Fall. FINDINGS: BONES AND JOINTS: No acute fracture. No malalignment. Severe first carpometacarpal (CMC) degenerative change. Moderate first and fifth metacarpophalangeal (MCP) degenerative change. Diffuse osteopenia. Consider CT for further evaluation. SOFT TISSUES: Unremarkable. IMPRESSION: 1. Osteopenia. No acute fracture or dislocation. Electronically signed by: Rogelia Myers MD 09/11/2024 12:54 PM EST RP Workstation: [id number]   DG Elbow Complete Left Result Date: 09/11/2024 EXAM: 3 VIEW(S) XRAY OF THE LEFT ELBOW COMPARISON: None available. CLINICAL HISTORY: Fall. FINDINGS: BONES AND JOINTS: Osteopenia. Large joint effusion. Subtle sclerotic line along the radial neck. No acute fracture. No malalignment. SOFT TISSUES:  Unremarkable. IMPRESSION: 1. Large joint effusion. Subtle sclerotic line along the radial neck, consistent with a nondisplaced fracture. Electronically signed by: Rogelia Myers MD 09/11/2024 12:51 PM EST RP Workstation: [id number]    PROCEDURES:  Critical Care performed: No  Procedures   MEDICATIONS ORDERED IN ED: Medications  acetaminophen  (TYLENOL ) tablet 1,000 mg (1,000 mg Oral Given 09/11/24 1237)    IMPRESSION / MDM / ASSESSMENT AND PLAN / ED COURSE  I reviewed the triage vital signs and the nursing notes.                             Clinical Course as of 09/11/24 1335  Fri Sep 11, 2024  1334 DG Shoulder Left IMPRESSION: 1. Osteopenia. No acute fracture or dislocation.   [MH]  1334 DG Wrist Complete Left IMPRESSION: 1. Osteopenia. No acute fracture or dislocation.   [MH]  1335 DG Elbow Complete Left IMPRESSION: 1. Large joint effusion. Subtle sclerotic line along the radial neck, consistent with a nondisplaced fracture.   [MH]    Clinical Course User Index [MH] Margrette Rebbeca LABOR, PA-C   89 y.o. female presents to the emergency department for evaluation and treatment of mechanical fall sustaining left arm injury. See HPI for further details.   Differential diagnosis includes, but is not limited to fracture, dislocation, contusion, effusion, hematoma  Patient's presentation is most consistent with acute complicated illness / injury requiring diagnostic workup.  Patient is alert.  She is hemodynamically stable.  Vital signs within normal limits.  Physical exam findings are pertinent for moderate swelling and tenderness to left elbow.  Left elbow x-ray shows large joint effusion with nondisplaced fracture along radial neck.  We will place patient in a sling.  She was fairly combative during this process, son  Rulon was contacted at bedside and was able to calm patient down.  We were able to successfully apply the sling.  We did discuss a temporary splint, however  given her mental status she does have a history of unwrapping the splints in the past per son.  Patient is in stable condition for discharge home.  We will have her follow-up with orthopedics in 5 to 7 days for further evaluation.  This was all discussed with her son who verbalized understanding.  RICE therapy education provided at discharge.   FINAL CLINICAL IMPRESSION(S) / ED DIAGNOSES   Final diagnoses:  Fall, initial encounter  Left elbow fracture, closed, initial encounter   Rx / DC Orders   ED Discharge Orders     None        Note:  This document was prepared using Dragon voice recognition software and may include unintentional dictation errors.    Margrette, Kennley Schwandt A, PA-C 09/11/24 1338  "

## 2024-09-11 NOTE — ED Triage Notes (Addendum)
 Pt slipped and fell while shopping today and landed on left elbow. Complains of pain all the way up arm and down to wrist. No deformities noted. Pulses presented. Did not hit head. No LOC

## 2024-09-11 NOTE — Discharge Instructions (Addendum)
 Your left elbow x-ray shows:  IMPRESSION:  1. Large joint effusion. Subtle sclerotic line along the radial neck,  consistent with a nondisplaced fracture.   We have placed you in a sling and encourage you to remain in the sling until you are able to follow-up with orthopedics in 5 to 7 days.  Please call and schedule an appointment with Dr. Lorelle.   Keep ice on the area to help reduce swelling at least 3 times daily.  Keep elevated is much as possible.  3 times a day perform gentle range of motion exercises to prevent stiffness.  Take Tylenol  for pain as needed.  If any new or worsening symptoms occur please return to ED for further evaluation.
